# Patient Record
Sex: Male | Born: 1975 | Race: Black or African American | Hispanic: No | Marital: Single | Smoking: Current every day smoker
Health system: Southern US, Community
[De-identification: ages and names within clinical notes are randomized; demographics above are authoritative.]

## PROBLEM LIST (undated history)

## (undated) ENCOUNTER — Emergency Department (HOSPITAL_COMMUNITY): Discharge status: Absent without leave | Payer: Medicare Other

## (undated) ENCOUNTER — Emergency Department (HOSPITAL_COMMUNITY): Discharge status: Absent without leave | Payer: Self-pay

## (undated) DIAGNOSIS — Z720 Tobacco use: Secondary | ICD-10-CM

## (undated) DIAGNOSIS — C801 Malignant (primary) neoplasm, unspecified: Secondary | ICD-10-CM

## (undated) DIAGNOSIS — J40 Bronchitis, not specified as acute or chronic: Secondary | ICD-10-CM

## (undated) HISTORY — DX: Bronchitis, not specified as acute or chronic: J40

---

## 2004-02-14 ENCOUNTER — Emergency Department (HOSPITAL_COMMUNITY): Admission: AD | Admit: 2004-02-14 | Discharge: 2004-02-14 | Payer: Self-pay | Admitting: Emergency Medicine

## 2004-02-14 ENCOUNTER — Emergency Department (HOSPITAL_COMMUNITY): Admission: EM | Admit: 2004-02-14 | Discharge: 2004-02-14 | Payer: Self-pay | Admitting: Emergency Medicine

## 2004-05-19 ENCOUNTER — Encounter: Admission: RE | Admit: 2004-05-19 | Discharge: 2004-06-04 | Payer: Self-pay | Admitting: Orthopaedic Surgery

## 2005-04-01 ENCOUNTER — Emergency Department (HOSPITAL_COMMUNITY): Admission: EM | Admit: 2005-04-01 | Discharge: 2005-04-01 | Payer: Self-pay | Admitting: Emergency Medicine

## 2007-05-28 ENCOUNTER — Emergency Department (HOSPITAL_COMMUNITY): Admission: EM | Admit: 2007-05-28 | Discharge: 2007-05-28 | Payer: Self-pay | Admitting: Emergency Medicine

## 2007-11-11 ENCOUNTER — Emergency Department (HOSPITAL_COMMUNITY): Admission: EM | Admit: 2007-11-11 | Discharge: 2007-11-11 | Payer: Self-pay | Admitting: Emergency Medicine

## 2010-11-19 ENCOUNTER — Emergency Department (HOSPITAL_COMMUNITY)
Admission: EM | Admit: 2010-11-19 | Discharge: 2010-11-19 | Payer: Self-pay | Source: Home / Self Care | Admitting: Emergency Medicine

## 2011-02-06 ENCOUNTER — Emergency Department (HOSPITAL_COMMUNITY)
Admission: EM | Admit: 2011-02-06 | Discharge: 2011-02-07 | Disposition: A | Discharge status: Home / Self Care | Payer: Medicare Other | Attending: Emergency Medicine | Admitting: Emergency Medicine

## 2011-02-06 ENCOUNTER — Emergency Department (HOSPITAL_COMMUNITY): Payer: Medicare Other

## 2011-02-06 DIAGNOSIS — M545 Low back pain, unspecified: Secondary | ICD-10-CM | POA: Insufficient documentation

## 2011-02-06 DIAGNOSIS — X500XXA Overexertion from strenuous movement or load, initial encounter: Secondary | ICD-10-CM | POA: Insufficient documentation

## 2011-02-06 DIAGNOSIS — Y92410 Unspecified street and highway as the place of occurrence of the external cause: Secondary | ICD-10-CM | POA: Insufficient documentation

## 2012-03-20 DIAGNOSIS — Z0000 Encounter for general adult medical examination without abnormal findings: Secondary | ICD-10-CM | POA: Diagnosis not present

## 2012-03-30 DIAGNOSIS — I10 Essential (primary) hypertension: Secondary | ICD-10-CM | POA: Diagnosis not present

## 2012-03-30 DIAGNOSIS — R7309 Other abnormal glucose: Secondary | ICD-10-CM | POA: Diagnosis not present

## 2012-03-30 DIAGNOSIS — M549 Dorsalgia, unspecified: Secondary | ICD-10-CM | POA: Diagnosis not present

## 2012-03-30 DIAGNOSIS — Z0000 Encounter for general adult medical examination without abnormal findings: Secondary | ICD-10-CM | POA: Diagnosis not present

## 2012-03-30 DIAGNOSIS — R634 Abnormal weight loss: Secondary | ICD-10-CM | POA: Diagnosis not present

## 2012-03-30 DIAGNOSIS — E785 Hyperlipidemia, unspecified: Secondary | ICD-10-CM | POA: Diagnosis not present

## 2012-06-29 DIAGNOSIS — R634 Abnormal weight loss: Secondary | ICD-10-CM | POA: Diagnosis not present

## 2012-06-29 DIAGNOSIS — M549 Dorsalgia, unspecified: Secondary | ICD-10-CM | POA: Diagnosis not present

## 2012-10-01 DIAGNOSIS — R634 Abnormal weight loss: Secondary | ICD-10-CM | POA: Diagnosis not present

## 2012-10-01 DIAGNOSIS — M549 Dorsalgia, unspecified: Secondary | ICD-10-CM | POA: Diagnosis not present

## 2012-12-31 DIAGNOSIS — R599 Enlarged lymph nodes, unspecified: Secondary | ICD-10-CM | POA: Diagnosis not present

## 2012-12-31 DIAGNOSIS — R634 Abnormal weight loss: Secondary | ICD-10-CM | POA: Diagnosis not present

## 2013-04-02 DIAGNOSIS — H6120 Impacted cerumen, unspecified ear: Secondary | ICD-10-CM | POA: Diagnosis not present

## 2013-04-15 DIAGNOSIS — H6120 Impacted cerumen, unspecified ear: Secondary | ICD-10-CM | POA: Diagnosis not present

## 2013-07-16 DIAGNOSIS — M549 Dorsalgia, unspecified: Secondary | ICD-10-CM | POA: Diagnosis not present

## 2013-07-30 DIAGNOSIS — M549 Dorsalgia, unspecified: Secondary | ICD-10-CM | POA: Diagnosis not present

## 2013-08-02 ENCOUNTER — Ambulatory Visit (HOSPITAL_COMMUNITY)
Admission: RE | Admit: 2013-08-02 | Discharge: 2013-08-02 | Disposition: A | Discharge status: Home / Self Care | Payer: Medicare Other | Source: Ambulatory Visit | Attending: Internal Medicine | Admitting: Internal Medicine

## 2013-08-02 ENCOUNTER — Other Ambulatory Visit (HOSPITAL_COMMUNITY): Payer: Self-pay | Admitting: Internal Medicine

## 2013-08-02 DIAGNOSIS — R141 Gas pain: Secondary | ICD-10-CM | POA: Insufficient documentation

## 2013-08-02 DIAGNOSIS — M545 Low back pain, unspecified: Secondary | ICD-10-CM | POA: Diagnosis not present

## 2013-08-02 DIAGNOSIS — R142 Eructation: Secondary | ICD-10-CM | POA: Insufficient documentation

## 2013-08-02 DIAGNOSIS — K6389 Other specified diseases of intestine: Secondary | ICD-10-CM | POA: Insufficient documentation

## 2013-08-02 DIAGNOSIS — R140 Abdominal distension (gaseous): Secondary | ICD-10-CM

## 2013-08-02 DIAGNOSIS — M549 Dorsalgia, unspecified: Secondary | ICD-10-CM

## 2013-08-02 DIAGNOSIS — J438 Other emphysema: Secondary | ICD-10-CM | POA: Diagnosis not present

## 2013-08-02 DIAGNOSIS — J40 Bronchitis, not specified as acute or chronic: Secondary | ICD-10-CM | POA: Diagnosis not present

## 2013-09-03 DIAGNOSIS — K5909 Other constipation: Secondary | ICD-10-CM | POA: Diagnosis not present

## 2013-09-03 DIAGNOSIS — M549 Dorsalgia, unspecified: Secondary | ICD-10-CM | POA: Diagnosis not present

## 2013-09-23 DIAGNOSIS — M549 Dorsalgia, unspecified: Secondary | ICD-10-CM | POA: Diagnosis not present

## 2013-11-16 ENCOUNTER — Emergency Department (HOSPITAL_COMMUNITY)
Admission: EM | Admit: 2013-11-16 | Discharge: 2013-11-16 | Disposition: A | Discharge status: Home / Self Care | Payer: Medicare Other | Attending: Emergency Medicine | Admitting: Emergency Medicine

## 2013-11-16 ENCOUNTER — Encounter (HOSPITAL_COMMUNITY): Payer: Self-pay | Admitting: Emergency Medicine

## 2013-11-16 ENCOUNTER — Emergency Department (HOSPITAL_COMMUNITY): Payer: Medicare Other

## 2013-11-16 DIAGNOSIS — Y92410 Unspecified street and highway as the place of occurrence of the external cause: Secondary | ICD-10-CM | POA: Insufficient documentation

## 2013-11-16 DIAGNOSIS — S4980XA Other specified injuries of shoulder and upper arm, unspecified arm, initial encounter: Secondary | ICD-10-CM | POA: Insufficient documentation

## 2013-11-16 DIAGNOSIS — Y9389 Activity, other specified: Secondary | ICD-10-CM | POA: Insufficient documentation

## 2013-11-16 DIAGNOSIS — F17200 Nicotine dependence, unspecified, uncomplicated: Secondary | ICD-10-CM | POA: Insufficient documentation

## 2013-11-16 DIAGNOSIS — M79609 Pain in unspecified limb: Secondary | ICD-10-CM | POA: Diagnosis not present

## 2013-11-16 DIAGNOSIS — M79602 Pain in left arm: Secondary | ICD-10-CM

## 2013-11-16 DIAGNOSIS — S46909A Unspecified injury of unspecified muscle, fascia and tendon at shoulder and upper arm level, unspecified arm, initial encounter: Secondary | ICD-10-CM | POA: Diagnosis not present

## 2013-11-16 MED ORDER — DICLOFENAC SODIUM 75 MG PO TBEC: 75.0000 mg | DELAYED_RELEASE_TABLET | Freq: Two times a day (BID) | ORAL | Status: DC

## 2013-11-16 MED ORDER — METHOCARBAMOL 500 MG PO TABS: 500.0000 mg | ORAL_TABLET | Freq: Three times a day (TID) | ORAL | Status: DC
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
## 2013-11-16 NOTE — ED Provider Notes (Signed)
Medical screening examination/treatment/procedure(s) were performed by non-physician practitioner and as supervising physician I was immediately available for consultation/collaboration.  EKG Interpretation   None        Amaziah Raisanen, MD 11/16/13 2333 
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
## 2014-01-01 DIAGNOSIS — Z0000 Encounter for general adult medical examination without abnormal findings: Secondary | ICD-10-CM | POA: Diagnosis not present

## 2014-01-01 DIAGNOSIS — R634 Abnormal weight loss: Secondary | ICD-10-CM | POA: Diagnosis not present

## 2014-01-01 DIAGNOSIS — R7309 Other abnormal glucose: Secondary | ICD-10-CM | POA: Diagnosis not present

## 2014-01-01 DIAGNOSIS — M549 Dorsalgia, unspecified: Secondary | ICD-10-CM | POA: Diagnosis not present

## 2014-01-01 DIAGNOSIS — E7800 Pure hypercholesterolemia, unspecified: Secondary | ICD-10-CM | POA: Diagnosis not present

## 2014-03-26 ENCOUNTER — Encounter (HOSPITAL_COMMUNITY): Payer: Self-pay | Admitting: Emergency Medicine

## 2014-03-26 ENCOUNTER — Emergency Department (HOSPITAL_COMMUNITY)
Admission: EM | Admit: 2014-03-26 | Discharge: 2014-03-26 | Disposition: A | Discharge status: Home / Self Care | Payer: Medicare Other | Attending: Emergency Medicine | Admitting: Emergency Medicine

## 2014-03-26 DIAGNOSIS — G8911 Acute pain due to trauma: Secondary | ICD-10-CM | POA: Insufficient documentation

## 2014-03-26 DIAGNOSIS — F17200 Nicotine dependence, unspecified, uncomplicated: Secondary | ICD-10-CM | POA: Insufficient documentation

## 2014-03-26 DIAGNOSIS — M79601 Pain in right arm: Secondary | ICD-10-CM

## 2014-03-26 DIAGNOSIS — M79609 Pain in unspecified limb: Secondary | ICD-10-CM | POA: Diagnosis not present

## 2014-03-26 MED ORDER — ONDANSETRON HCL 4 MG PO TABS
4.0000 mg | ORAL_TABLET | Freq: Once | ORAL | Status: AC
  Administered 2014-03-26: 4 mg via ORAL
  Filled 2014-03-26: qty 1

## 2014-03-26 MED ORDER — ACETAMINOPHEN-CODEINE #3 300-30 MG PO TABS
2.0000 | ORAL_TABLET | Freq: Once | ORAL | Status: AC
  Administered 2014-03-26: 2 via ORAL
  Filled 2014-03-26: qty 2

## 2014-03-26 MED ORDER — DEXAMETHASONE SODIUM PHOSPHATE 4 MG/ML IJ SOLN
8.0000 mg | Freq: Once | INTRAMUSCULAR | Status: DC
  Filled 2014-03-26: qty 2

## 2014-03-26 MED ORDER — KETOROLAC TROMETHAMINE 10 MG PO TABS
10.0000 mg | ORAL_TABLET | Freq: Once | ORAL | Status: AC
  Administered 2014-03-26: 10 mg via ORAL
  Filled 2014-03-26: qty 1

## 2014-03-26 NOTE — ED Notes (Signed)
C/o right arm pain started today
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
## 2014-03-26 NOTE — Discharge Instructions (Signed)
I have reviewed your records and your x-ray from previous emergency room visit. Your examination today does not show evidence of any acute changes or problems. No acute neurovascular compromise is appreciated. Please see the orthopedic specialist listed above for evaluation of this problem. Please see Dr. Legrand Rams for assistance with your pain management.

## 2014-03-26 NOTE — ED Provider Notes (Signed)
Medical screening examination/treatment/procedure(s) were performed by non-physician practitioner and as supervising physician I was immediately available for consultation/collaboration.   EKG Interpretation None        Maudry Diego, MD 03/26/14 1521

## 2014-04-03 DIAGNOSIS — M549 Dorsalgia, unspecified: Secondary | ICD-10-CM | POA: Diagnosis not present

## 2014-04-03 DIAGNOSIS — M25529 Pain in unspecified elbow: Secondary | ICD-10-CM | POA: Diagnosis not present

## 2014-04-18 ENCOUNTER — Other Ambulatory Visit (HOSPITAL_COMMUNITY): Payer: Self-pay | Admitting: Orthopaedic Surgery

## 2014-04-18 DIAGNOSIS — M79609 Pain in unspecified limb: Secondary | ICD-10-CM | POA: Diagnosis not present

## 2014-04-18 DIAGNOSIS — F17200 Nicotine dependence, unspecified, uncomplicated: Secondary | ICD-10-CM | POA: Diagnosis not present

## 2014-04-18 DIAGNOSIS — M25519 Pain in unspecified shoulder: Secondary | ICD-10-CM | POA: Diagnosis not present

## 2014-04-18 DIAGNOSIS — M25511 Pain in right shoulder: Secondary | ICD-10-CM

## 2014-04-22 ENCOUNTER — Ambulatory Visit (HOSPITAL_COMMUNITY)
Admission: RE | Admit: 2014-04-22 | Discharge: 2014-04-22 | Disposition: A | Discharge status: Home / Self Care | Payer: Medicare Other | Source: Ambulatory Visit | Attending: Orthopaedic Surgery | Admitting: Orthopaedic Surgery

## 2014-04-22 DIAGNOSIS — M6789 Other specified disorders of synovium and tendon, multiple sites: Secondary | ICD-10-CM | POA: Diagnosis not present

## 2014-04-22 DIAGNOSIS — M7530 Calcific tendinitis of unspecified shoulder: Secondary | ICD-10-CM | POA: Diagnosis not present

## 2014-04-22 DIAGNOSIS — M25619 Stiffness of unspecified shoulder, not elsewhere classified: Secondary | ICD-10-CM | POA: Insufficient documentation

## 2014-04-22 DIAGNOSIS — M25519 Pain in unspecified shoulder: Secondary | ICD-10-CM | POA: Insufficient documentation

## 2014-04-22 DIAGNOSIS — M25511 Pain in right shoulder: Secondary | ICD-10-CM

## 2014-04-29 DIAGNOSIS — F17200 Nicotine dependence, unspecified, uncomplicated: Secondary | ICD-10-CM | POA: Diagnosis not present

## 2014-04-29 DIAGNOSIS — M25519 Pain in unspecified shoulder: Secondary | ICD-10-CM | POA: Diagnosis not present

## 2014-05-05 ENCOUNTER — Ambulatory Visit (HOSPITAL_COMMUNITY)
Admission: RE | Admit: 2014-05-05 | Discharge: 2014-05-05 | Disposition: A | Discharge status: Home / Self Care | Payer: Medicare Other | Source: Ambulatory Visit | Attending: Orthopaedic Surgery | Admitting: Orthopaedic Surgery

## 2014-05-05 DIAGNOSIS — M25519 Pain in unspecified shoulder: Secondary | ICD-10-CM | POA: Insufficient documentation

## 2014-05-05 DIAGNOSIS — M25619 Stiffness of unspecified shoulder, not elsewhere classified: Secondary | ICD-10-CM | POA: Insufficient documentation

## 2014-05-05 DIAGNOSIS — IMO0001 Reserved for inherently not codable concepts without codable children: Secondary | ICD-10-CM | POA: Insufficient documentation

## 2014-05-05 DIAGNOSIS — M25511 Pain in right shoulder: Secondary | ICD-10-CM

## 2014-05-05 DIAGNOSIS — M6281 Muscle weakness (generalized): Secondary | ICD-10-CM | POA: Diagnosis not present
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
## 2014-05-08 ENCOUNTER — Ambulatory Visit (HOSPITAL_COMMUNITY)
Admission: RE | Admit: 2014-05-08 | Discharge: 2014-05-08 | Disposition: A | Discharge status: Home / Self Care | Payer: Medicare Other | Source: Ambulatory Visit | Attending: Orthopaedic Surgery | Admitting: Orthopaedic Surgery

## 2014-05-08 DIAGNOSIS — IMO0001 Reserved for inherently not codable concepts without codable children: Secondary | ICD-10-CM | POA: Diagnosis not present

## 2014-05-08 NOTE — Progress Notes (Signed)
Occupational Therapy Treatment Patient Details  Name: Aaron Spence MRN: [id number] Date of Birth: February 17, 1976  Today's Date: 05/08/2014 Time: 5885-0277 OT Time Calculation (min): 37 min MFR 412-878 12' Therex 905-930  25'  Visit#: 2 of 8  Re-eval: 06/02/14    Authorization: Medicare A  Authorization Time Period: before 10th visit  Authorization Visit#: 2 of 10  Subjective Symptoms/Limitations Symptoms: S: It hurt last night and I had to take a pain pill.  Pain Assessment Currently in Pain?: No/denies  Precautions/Restrictions  Precautions Precautions: None  Exercise/Treatments Supine Protraction: PROM;5 reps;AROM;10 reps Horizontal ABduction: PROM;5 reps;AROM;10 reps External Rotation: PROM;5 reps;AROM;10 reps Internal Rotation: PROM;5 reps;AROM;10 reps Flexion: PROM;5 reps;AROM;10 reps ABduction: PROM;5 reps;AROM;10 reps Standing Protraction: AROM;10 reps Horizontal ABduction: AROM;10 reps External Rotation: AROM;10 reps Internal Rotation: AROM;10 reps Flexion: AROM;10 reps ABduction: AROM;10 reps ROM / Strengthening / Isometric Strengthening Wall Wash: 1' Proximal Shoulder Strengthening, Supine: 10X Proximal Shoulder Strengthening, Seated: 10X      Manual Therapy Manual Therapy: Myofascial release Myofascial Release: Myofascial release (MFR) and manual stretching to right upper arm, trapezius, and scapularis region to decrease fascial restrictions and increase joint mobility in a pain free zone.   Occupational Therapy Assessment and Plan OT Assessment and Plan Clinical Impression Statement: A: Pt completed AROM supine and standing with vc's needed for proper form and technique. Patient tolerated well overall.  OT Plan: P: Add ball on the wall and UBE bike. Increase reps for AROM   Goals Long Term Goals Time to Complete Long Term Goals: 4 weeks Long Term Goal 1: Patient will be educated on HEP. Long Term Goal 1 Progress: Progressing toward goal Long  Term Goal 2: Patient will return to highest level of independence with all BADL and leisure tasks.  Long Term Goal 2 Progress: Progressing toward goal Long Term Goal 3: Patient will report a pain level of 4/10 or less with daily tasks.  Long Term Goal 3 Progress: Progressing toward goal Long Term Goal 4: Patient will increase RUE shoulder AROM to WNL to increase ability to reach into overhead cabinets. Long Term Goal 4 Progress: Progressing toward goal Long Term Goal 5: Patient will increase RUE shoulder strength to 5/5 to increase ability to lift heavy items out of overhead cabinets. Long Term Goal 5 Progress: Progressing toward goal Additional Long Term Goals?: Yes Long Term Goal 6: Patient will decrease fascial restrictions from Max to Min amount in RUE.  Long Term Goal 6 Progress: Progressing toward goal  Problem List Patient Active Problem List   Diagnosis Date Noted  . Pain in joint, shoulder region 05/05/2014  . Muscle weakness (generalized) 05/05/2014    End of Session Activity Tolerance: Patient tolerated treatment well General Behavior During Therapy: [facility] for tasks assessed/performed   Ailene Ravel, OTR/L,CBIS   05/08/2014, 9:31 AM

## 2014-05-13 ENCOUNTER — Ambulatory Visit (HOSPITAL_COMMUNITY)
Admission: RE | Admit: 2014-05-13 | Discharge: 2014-05-13 | Disposition: A | Discharge status: Home / Self Care | Payer: Medicare Other | Source: Ambulatory Visit

## 2014-05-13 DIAGNOSIS — IMO0001 Reserved for inherently not codable concepts without codable children: Secondary | ICD-10-CM | POA: Diagnosis not present

## 2014-05-13 NOTE — Progress Notes (Signed)
Occupational Therapy Treatment Patient Details  Name: Aaron Spence MRN: [id number] Date of Birth: October 01, 1976  Today's Date: 05/13/2014 Time: 1540-0867 OT Time Calculation (min): 42 min Manual 6195-0932 (15') Therapeutic Exercises 1406-1433 (102')  Visit#: 3 of 8  Re-eval: 06/02/14    Authorization: Medicare A  Authorization Time Period: before 10th visit  Authorization Visit#: 3 of 10  Subjective Symptoms/Limitations Symptoms: "its a little bit better. its getting there, but not all the way." Pain Assessment Currently in Pain?: Yes Pain Score: 7  Pain Location: Shoulder Pain Orientation: Right Pain Type: Acute pain  Precautions/Restrictions     Exercise/Treatments Supine Protraction: PROM;10 reps;AROM;12 reps Horizontal ABduction: PROM;10 reps;AROM;12 reps External Rotation: PROM;10 reps;AROM;12 reps Internal Rotation: PROM;10 reps;AROM;12 reps Flexion: PROM;10 reps;AROM;12 reps ABduction: PROM;10 reps;AROM;12 reps Standing Protraction: AROM;12 reps Horizontal ABduction: AROM;12 reps External Rotation: AROM;12 reps Internal Rotation: AROM;12 reps Flexion: AROM;12 reps ABduction: AROM;12 reps ROM / Strengthening / Isometric Strengthening UBE (Upper Arm Bike): 2 min forward and back at 3.0 Proximal Shoulder Strengthening, Supine: 12x Proximal Shoulder Strengthening, Seated: 12x Ball on Wall: 1 min each in flexion and abduction with weighted green ball   Manual Therapy Manual Therapy: Myofascial release Myofascial Release: Myofascial release (MFR) and manual stretching to right upper arm, trapezius, and scapularis region to decrease fascial restrictions and increase joint mobility in a pain free zone.     Occupational Therapy Assessment and Plan OT Assessment and Plan Clinical Impression Statement: Increased reps with supine and standing AROM this session. Pt denied dincreased pain with motion, but had increased facial grimicing with supine. Pt only completed  exercises using affected UE in supine, but used BUE in standing. Added ball on wall in flexion and abduction - pt did not verbalize any difficulty, but did demonstrate increased diffiuclty with maintain ball position on wall in abduction.  Added UBE, and pt tolerated well.  Pt required significant verbal cueing overall for form and technique.  OT Plan: Increase AROM reps - facilitate improved form in AROM with fewer cues.   Goals Long Term Goals Long Term Goal 1: Patient will be educated on HEP. Long Term Goal 1 Progress: Progressing toward goal Long Term Goal 2: Patient will return to highest level of independence with all BADL and leisure tasks.  Long Term Goal 2 Progress: Progressing toward goal Long Term Goal 3: Patient will report a pain level of 4/10 or less with daily tasks.  Long Term Goal 3 Progress: Progressing toward goal Long Term Goal 4: Patient will increase RUE shoulder AROM to WNL to increase ability to reach into overhead cabinets. Long Term Goal 4 Progress: Progressing toward goal Long Term Goal 5: Patient will increase RUE shoulder strength to 5/5 to increase ability to lift heavy items out of overhead cabinets. Long Term Goal 5 Progress: Progressing toward goal Long Term Goal 6: Patient will decrease fascial restrictions from Max to Min amount in RUE.  Long Term Goal 6 Progress: Progressing toward goal  Problem List Patient Active Problem List   Diagnosis Date Noted  . Pain in joint, shoulder region 05/05/2014  . Muscle weakness (generalized) 05/05/2014    End of Session Activity Tolerance: Patient tolerated treatment well General Behavior During Therapy: [facility] for tasks assessed/performed  GO   Bea Graff, MS, OTR/L [phone]  05/13/2014, 2:33 PM

## 2014-05-15 ENCOUNTER — Ambulatory Visit (HOSPITAL_COMMUNITY)
Admission: RE | Admit: 2014-05-15 | Discharge: 2014-05-15 | Disposition: A | Discharge status: Home / Self Care | Payer: Medicare Other | Source: Ambulatory Visit | Attending: Internal Medicine | Admitting: Internal Medicine

## 2014-05-15 DIAGNOSIS — IMO0001 Reserved for inherently not codable concepts without codable children: Secondary | ICD-10-CM | POA: Diagnosis not present

## 2014-05-15 NOTE — Progress Notes (Signed)
Occupational Therapy Treatment Patient Details  Name: Aaron Spence MRN: [id number] Date of Birth: Apr 16, 1976  Today's Date: 05/15/2014 Time: 8921-1941 OT Time Calculation (min): 43 min MFR 1350-1404 14' Therex 7408-1448 29'  Visit#: 4 of 8  Re-eval: 06/02/14    Authorization: Medicare A  Authorization Time Period: before 10th visit  Authorization Visit#: 4 of 10  Subjective Symptoms/Limitations Symptoms: S: It doesn't start huring until it's agravated. Pain Assessment Currently in Pain?: No/denies  Precautions/Restrictions  Precautions Precautions: None  Exercise/Treatments Supine Protraction: PROM;10 reps;AROM;12 reps Horizontal ABduction: PROM;10 reps;AROM;12 reps External Rotation: PROM;10 reps;AROM;12 reps Internal Rotation: PROM;10 reps;AROM;12 reps Flexion: PROM;10 reps;AROM;12 reps ABduction: PROM;10 reps;AROM;12 reps Standing Protraction: AROM;12 reps Horizontal ABduction: AROM;12 reps External Rotation: AROM;12 reps Internal Rotation: AROM;12 reps Flexion: AROM;12 reps ABduction: AROM;12 reps ROM / Strengthening / Isometric Strengthening UBE (Upper Arm Bike): Level 1 3' forward 3' reverse Proximal Shoulder Strengthening, Supine: 12x Proximal Shoulder Strengthening, Seated: 12x Ball on Wall: 1 min each in flexion and abduction with weighted green ball      Manual Therapy Manual Therapy: Myofascial release Myofascial Release: Myofascial release (MFR) and manual stretching to right upper arm, trapezius, and scapularis region to decrease fascial restrictions and increase joint mobility in a pain free zone.  Occupational Therapy Assessment and Plan OT Assessment and Plan Clinical Impression Statement: [name] completed all exercises with only initial cueing for proper form at start of movement. No difficulty keeping ball in position during ball on the wall.  OT Plan: P: Add X to V arms   Goals Long Term Goals Time to Complete Long Term Goals: 4  weeks Long Term Goal 1: Patient will be educated on HEP. Long Term Goal 1 Progress: Progressing toward goal Long Term Goal 2: Patient will return to highest level of independence with all BADL and leisure tasks.  Long Term Goal 2 Progress: Progressing toward goal Long Term Goal 3: Patient will report a pain level of 4/10 or less with daily tasks.  Long Term Goal 3 Progress: Progressing toward goal Long Term Goal 4: Patient will increase RUE shoulder AROM to WNL to increase ability to reach into overhead cabinets. Long Term Goal 4 Progress: Progressing toward goal Long Term Goal 5: Patient will increase RUE shoulder strength to 5/5 to increase ability to lift heavy items out of overhead cabinets. Long Term Goal 5 Progress: Progressing toward goal Long Term Goal 6: Patient will decrease fascial restrictions from Max to Min amount in RUE.  Long Term Goal 6 Progress: Progressing toward goal  Problem List Patient Active Problem List   Diagnosis Date Noted  . Pain in joint, shoulder region 05/05/2014  . Muscle weakness (generalized) 05/05/2014    End of Session Activity Tolerance: Patient tolerated treatment well General Behavior During Therapy: [facility] for tasks assessed/performed   Ailene Ravel, OTR/L,CBIS   05/15/2014, 2:28 PM

## 2014-05-20 ENCOUNTER — Ambulatory Visit (HOSPITAL_COMMUNITY)
Admission: RE | Admit: 2014-05-20 | Discharge: 2014-05-20 | Disposition: A | Discharge status: Home / Self Care | Payer: Medicare Other | Source: Ambulatory Visit | Attending: Orthopaedic Surgery | Admitting: Orthopaedic Surgery

## 2014-05-20 DIAGNOSIS — IMO0001 Reserved for inherently not codable concepts without codable children: Secondary | ICD-10-CM | POA: Diagnosis not present

## 2014-05-20 NOTE — Progress Notes (Signed)
Occupational Therapy Treatment Patient Details  Name: Aaron Spence MRN: [id number] Date of Birth: 01/22/1976  Today's Date: 05/20/2014 Time: 1607-3710 OT Time Calculation (min): 48 min MFR 1347-1400 13' Therex 6269-4854  35'  Visit#: 5 of 8  Re-eval: 06/02/14    Authorization: Medicare A  Authorization Time Period: before 10th visit  Authorization Visit#: 5 of 10  Subjective Symptoms/Limitations Symptoms: S: My shoulder is just a little sore.  Pain Assessment Currently in Pain?: Yes Pain Score: 1  Pain Location: Shoulder Pain Orientation: Right Pain Type: Acute pain  Precautions/Restrictions  Precautions Precautions: None  Exercise/Treatments Supine Protraction: PROM;5 reps;Strengthening;Weights;12 reps Protraction Weight (lbs): 1 Horizontal ABduction: PROM;5 reps;Strengthening;Weights;12 reps Horizontal ABduction Weight (lbs): 1 External Rotation: PROM;5 reps;Strengthening;Weights;12 reps External Rotation Weight (lbs): 1 Internal Rotation: PROM;5 reps;Strengthening;Weights;12 reps Internal Rotation Weight (lbs): 1 Flexion: PROM;5 reps;Strengthening;Weights;12 reps Shoulder Flexion Weight (lbs): 1 ABduction: PROM;5 reps;Strengthening;Weights;12 reps Shoulder ABduction Weight (lbs): 1 Standing Protraction: AROM;12 reps Horizontal ABduction: AROM;12 reps External Rotation: AROM;12 reps Internal Rotation: AROM;12 reps Flexion: AROM;12 reps ABduction: AROM;12 reps ROM / Strengthening / Isometric Strengthening UBE (Upper Arm Bike): Level 1 3' forward 3' reverse X to V Arms: 12X Proximal Shoulder Strengthening, Supine: 10X with 1# Proximal Shoulder Strengthening, Seated: 12x Ball on Wall: 1 min each in flexion and abduction with weighted green ball      Manual Therapy Manual Therapy: Myofascial release Myofascial Release: Myofascial release (MFR) and manual stretching to right upper arm, trapezius, and scapularis region to decrease fascial restrictions and  increase joint mobility in a pain free zone  Occupational Therapy Assessment and Plan OT Assessment and Plan Clinical Impression Statement: [name] required min-mod assist for proper form and technique of exercises. Patient needed cueing to try and keep arms as straight as possible as he would consistantly bend his elbows. Patient does not count reps most of the time . Therapist requested patient to count out loud to remain on task. Added X to V arms and 1# handweight  supine. Patient tolerated well.  OT Plan: P: Cont to work on performing all exercises with proper form. Add Cybex row/press.   Goals Long Term Goals Time to Complete Long Term Goals: 4 weeks Long Term Goal 1: Patient will be educated on HEP. Long Term Goal 1 Progress: Progressing toward goal Long Term Goal 2: Patient will return to highest level of independence with all BADL and leisure tasks.  Long Term Goal 2 Progress: Progressing toward goal Long Term Goal 3: Patient will report a pain level of 4/10 or less with daily tasks.  Long Term Goal 3 Progress: Progressing toward goal Long Term Goal 4: Patient will increase RUE shoulder AROM to WNL to increase ability to reach into overhead cabinets. Long Term Goal 4 Progress: Progressing toward goal Long Term Goal 5: Patient will increase RUE shoulder strength to 5/5 to increase ability to lift heavy items out of overhead cabinets. Long Term Goal 5 Progress: Progressing toward goal Long Term Goal 6: Patient will decrease fascial restrictions from Max to Min amount in RUE.  Long Term Goal 6 Progress: Progressing toward goal  Problem List Patient Active Problem List   Diagnosis Date Noted  . Pain in joint, shoulder region 05/05/2014  . Muscle weakness (generalized) 05/05/2014    End of Session Activity Tolerance: Patient tolerated treatment well General Behavior During Therapy: [facility] for tasks assessed/performed   Ailene Ravel, OTR/L,CBIS   05/20/2014, 2:42 PM

## 2014-05-22 ENCOUNTER — Ambulatory Visit (HOSPITAL_COMMUNITY)
Admission: RE | Admit: 2014-05-22 | Discharge: 2014-05-22 | Disposition: A | Discharge status: Home / Self Care | Payer: Medicare Other | Source: Ambulatory Visit | Attending: Orthopaedic Surgery | Admitting: Orthopaedic Surgery

## 2014-05-22 DIAGNOSIS — IMO0001 Reserved for inherently not codable concepts without codable children: Secondary | ICD-10-CM | POA: Diagnosis not present
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
## 2014-05-27 ENCOUNTER — Inpatient Hospital Stay (HOSPITAL_COMMUNITY): Admission: RE | Admit: 2014-05-27 | Payer: Medicare Other | Source: Ambulatory Visit

## 2014-05-29 ENCOUNTER — Telehealth (HOSPITAL_COMMUNITY): Payer: Self-pay

## 2014-05-29 ENCOUNTER — Ambulatory Visit (HOSPITAL_COMMUNITY): Admission: RE | Admit: 2014-05-29 | Payer: Medicare Other | Source: Ambulatory Visit

## 2014-05-29 NOTE — Telephone Encounter (Signed)
05/29/14 1335  Called pt regarding missed appointment on 7/28 and current no-show status for 7/30 1:00 appointment.  [place] pt answered the phone, he responeded that he had arrived at therapy, but was not checked in yet. OTR went to the front office to talk with pt. Informed pt that he was more than 30 minutes late and that today's appointment would have to be cancelled due to therapist having another appointment scheduled.  Pt was understanding.  Pt stated that his previous missed appointment was due to transportation concerns.  Informed pt of next appointment time.  Bea Graff, [place], OTR/L [phone]

## 2014-06-02 ENCOUNTER — Ambulatory Visit (HOSPITAL_COMMUNITY)
Admission: RE | Admit: 2014-06-02 | Discharge: 2014-06-02 | Disposition: A | Discharge status: Home / Self Care | Payer: Medicare Other | Source: Ambulatory Visit | Attending: Orthopaedic Surgery | Admitting: Orthopaedic Surgery

## 2014-06-02 DIAGNOSIS — M25519 Pain in unspecified shoulder: Secondary | ICD-10-CM | POA: Insufficient documentation

## 2014-06-02 DIAGNOSIS — M25619 Stiffness of unspecified shoulder, not elsewhere classified: Secondary | ICD-10-CM | POA: Diagnosis not present

## 2014-06-02 DIAGNOSIS — IMO0001 Reserved for inherently not codable concepts without codable children: Secondary | ICD-10-CM | POA: Diagnosis not present

## 2014-06-02 DIAGNOSIS — M6281 Muscle weakness (generalized): Secondary | ICD-10-CM | POA: Insufficient documentation

## 2014-06-02 NOTE — Evaluation (Signed)
Occupational Therapy Reassessment and Discharge  Patient Details  Name: Aaron Spence MRN: [id number] Date of Birth: October 16, 1976  Today's Date: 06/02/2014 Time: 2683-4196 OT Time Calculation (min): 40 min MFR 2229-7989 13' Reassess 1318-1345 27'   Visit#: 7 of 8  Re-eval: 06/02/14  Assessment Diagnosis: Right Shoulder Pain  Authorization: Medicare A  Authorization Time Period: before 10th visit  Authorization Visit#: 7 of 10   Past Medical History: No past medical history on file. Past Surgical History: No past surgical history on file.  Subjective Symptoms/Limitations Symptoms: S: I did a lot of yardwork yesterday and my arm is really hurting me today.  Pain Assessment Currently in Pain?: Yes Pain Score: 8  Pain Location: Shoulder Pain Orientation: Right Pain Type: Acute pain  Precautions/Restrictions  Precautions Precautions: None   Assessment Additional Assessments RUE Assessment RUE Assessment:  (assessed seated. IR/ER adducted) RUE AROM (degrees) Right Shoulder Flexion: 140 Degrees (same on eval) Right Shoulder ABduction: 125 Degrees (on eval; 108) Right Shoulder Internal Rotation: 90 Degrees (same on eval) Right Shoulder External Rotation: 75 Degrees (on eval: same) RUE Strength Right Shoulder Flexion: 5/5 (on eval: 4-/5) Right Shoulder ABduction: 5/5 (on eval: 4-/5) Right Shoulder Internal Rotation: 5/5 (on eval: 4-/5) Right Shoulder External Rotation: 5/5 (on eval: 4-/5) Palpation Palpation: min fascial restrictions in right upper arm, trapezius, and scapularis region.      Exercise/Treatments    Manual Therapy Manual Therapy: Myofascial release Myofascial Release: Myofascial release (MFR) and manual stretching to right upper arm, trapezius, and scapularis region to decrease fascial restrictions and increase joint mobility in a pain free zone  Occupational Therapy Assessment and Plan OT Assessment and Plan Clinical Impression Statement: A:  Reassessment and discharge completed this date. patient has met 5/6 therapy goals. Pt continues to have pain during daily tasks and has increase joint mobiilty and strength measurements. Patient was given green theraband to use at home and  instructed to continue performing HEP at home independently.  OT Plan: P: D/C from therapy.    Goals Long Term Goals Time to Complete Long Term Goals: 4 weeks Long Term Goal 1: Patient will be educated on HEP. Long Term Goal 1 Progress: Met Long Term Goal 2: Patient will return to highest level of independence with all BADL and leisure tasks.  Long Term Goal 2 Progress: Met Long Term Goal 3: Patient will report a pain level of 4/10 or less with daily tasks.  Long Term Goal 3 Progress: Not met Long Term Goal 4: Patient will increase RUE shoulder AROM to WNL to increase ability to reach into overhead cabinets. Long Term Goal 4 Progress: Met Long Term Goal 5: Patient will increase RUE shoulder strength to 5/5 to increase ability to lift heavy items out of overhead cabinets. Long Term Goal 5 Progress: Met Long Term Goal 6: Patient will decrease fascial restrictions from Max to [place] amount in RUE.  Long Term Goal 6 Progress: Met  Problem List Patient Active Problem List   Diagnosis Date Noted  . Pain in joint, shoulder region 05/05/2014  . Muscle weakness (generalized) 05/05/2014    End of Session Activity Tolerance: Patient tolerated treatment well General Behavior During Therapy: W[facility] for tasks assessed/performed  GO Functional Assessment Tool Used: FOTO score: 75/100 (25% impaired) Functional Limitation: Carrying, moving and handling objects Carrying, Moving and Handling Objects Current Status (Q1194): At least 20 percent but less than 40 percent impaired, limited or restricted Carrying, Moving and Handling Objects Goal Status ([id number]): 0 percent impaired,  limited or restricted Carrying, Moving and Handling Objects Discharge Status [phone]): At least  20 percent but less than 40 percent impaired, limited or restricted  Ailene Ravel, OTR/L,CBIS   06/02/2014, 1:43 PM  Physician Documentation Your signature is required to indicate approval of the treatment plan as stated above.  Please sign and either send electronically or make a copy of this report for your files and return this physician signed original.  Please mark one 1.__approve of plan  2. ___approve of plan with the following conditions.   ______________________________                                                          _____________________ Physician Signature                                                                                                             Date

## 2014-06-04 ENCOUNTER — Ambulatory Visit (HOSPITAL_COMMUNITY): Payer: Medicare Other

## 2014-06-10 ENCOUNTER — Ambulatory Visit (HOSPITAL_COMMUNITY): Payer: Medicare Other

## 2014-06-12 ENCOUNTER — Ambulatory Visit (HOSPITAL_COMMUNITY): Payer: Medicare Other

## 2014-06-26 DIAGNOSIS — F17200 Nicotine dependence, unspecified, uncomplicated: Secondary | ICD-10-CM | POA: Diagnosis not present

## 2014-06-26 DIAGNOSIS — M25519 Pain in unspecified shoulder: Secondary | ICD-10-CM | POA: Diagnosis not present

## 2014-09-18 DIAGNOSIS — H6120 Impacted cerumen, unspecified ear: Secondary | ICD-10-CM | POA: Diagnosis not present

## 2014-10-16 ENCOUNTER — Emergency Department (HOSPITAL_COMMUNITY)
Admission: EM | Admit: 2014-10-16 | Discharge: 2014-10-16 | Disposition: A | Discharge status: Home / Self Care | Payer: Medicare Other | Attending: Emergency Medicine | Admitting: Emergency Medicine

## 2014-10-16 ENCOUNTER — Encounter (HOSPITAL_COMMUNITY): Payer: Self-pay | Admitting: Emergency Medicine

## 2014-10-16 ENCOUNTER — Emergency Department (HOSPITAL_COMMUNITY): Payer: Medicare Other

## 2014-10-16 DIAGNOSIS — Y9389 Activity, other specified: Secondary | ICD-10-CM | POA: Diagnosis not present

## 2014-10-16 DIAGNOSIS — M25561 Pain in right knee: Secondary | ICD-10-CM | POA: Diagnosis not present

## 2014-10-16 DIAGNOSIS — T1490XA Injury, unspecified, initial encounter: Secondary | ICD-10-CM

## 2014-10-16 DIAGNOSIS — S80911A Unspecified superficial injury of right knee, initial encounter: Secondary | ICD-10-CM | POA: Diagnosis not present

## 2014-10-16 DIAGNOSIS — S79921A Unspecified injury of right thigh, initial encounter: Secondary | ICD-10-CM | POA: Diagnosis not present

## 2014-10-16 DIAGNOSIS — S7011XA Contusion of right thigh, initial encounter: Secondary | ICD-10-CM | POA: Diagnosis not present

## 2014-10-16 DIAGNOSIS — Y998 Other external cause status: Secondary | ICD-10-CM | POA: Insufficient documentation

## 2014-10-16 DIAGNOSIS — S8991XA Unspecified injury of right lower leg, initial encounter: Secondary | ICD-10-CM

## 2014-10-16 DIAGNOSIS — Z720 Tobacco use: Secondary | ICD-10-CM | POA: Diagnosis not present

## 2014-10-16 DIAGNOSIS — Y92410 Unspecified street and highway as the place of occurrence of the external cause: Secondary | ICD-10-CM | POA: Diagnosis not present

## 2014-10-16 DIAGNOSIS — M79604 Pain in right leg: Secondary | ICD-10-CM | POA: Diagnosis not present

## 2014-10-16 MED ORDER — NAPROXEN 500 MG PO TABS: 500.0000 mg | ORAL_TABLET | Freq: Two times a day (BID) | ORAL | Status: DC
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
## 2014-10-16 NOTE — Discharge Instructions (Signed)
Use a knee immobilizer at all times can take it off to shower. Crutches as needed. Make an appointment to follow-up with orthopedics Dr. Luna Glasgow. Take the Naprosyn as directed. Return for any new or worse symptoms.

## 2015-03-10 DIAGNOSIS — Z681 Body mass index (BMI) 19 or less, adult: Secondary | ICD-10-CM | POA: Diagnosis not present

## 2015-03-10 DIAGNOSIS — M25411 Effusion, right shoulder: Secondary | ICD-10-CM | POA: Diagnosis not present

## 2015-03-10 DIAGNOSIS — F17201 Nicotine dependence, unspecified, in remission: Secondary | ICD-10-CM | POA: Diagnosis not present

## 2015-04-08 DIAGNOSIS — M545 Low back pain: Secondary | ICD-10-CM | POA: Diagnosis not present

## 2015-04-08 DIAGNOSIS — M25411 Effusion, right shoulder: Secondary | ICD-10-CM | POA: Diagnosis not present

## 2015-04-08 DIAGNOSIS — F17201 Nicotine dependence, unspecified, in remission: Secondary | ICD-10-CM | POA: Diagnosis not present

## 2015-04-08 DIAGNOSIS — Z681 Body mass index (BMI) 19 or less, adult: Secondary | ICD-10-CM | POA: Diagnosis not present

## 2015-05-06 DIAGNOSIS — Z681 Body mass index (BMI) 19 or less, adult: Secondary | ICD-10-CM | POA: Diagnosis not present

## 2015-05-06 DIAGNOSIS — M25411 Effusion, right shoulder: Secondary | ICD-10-CM | POA: Diagnosis not present

## 2015-05-06 DIAGNOSIS — F17201 Nicotine dependence, unspecified, in remission: Secondary | ICD-10-CM | POA: Diagnosis not present

## 2015-05-06 DIAGNOSIS — M545 Low back pain: Secondary | ICD-10-CM | POA: Diagnosis not present

## 2015-06-03 DIAGNOSIS — Z681 Body mass index (BMI) 19 or less, adult: Secondary | ICD-10-CM | POA: Diagnosis not present

## 2015-06-03 DIAGNOSIS — F17201 Nicotine dependence, unspecified, in remission: Secondary | ICD-10-CM | POA: Diagnosis not present

## 2015-06-03 DIAGNOSIS — M5441 Lumbago with sciatica, right side: Secondary | ICD-10-CM | POA: Diagnosis not present

## 2015-06-03 DIAGNOSIS — M25511 Pain in right shoulder: Secondary | ICD-10-CM | POA: Diagnosis not present

## 2015-07-03 DIAGNOSIS — Z681 Body mass index (BMI) 19 or less, adult: Secondary | ICD-10-CM | POA: Diagnosis not present

## 2015-07-03 DIAGNOSIS — F17201 Nicotine dependence, unspecified, in remission: Secondary | ICD-10-CM | POA: Diagnosis not present

## 2015-07-03 DIAGNOSIS — M25511 Pain in right shoulder: Secondary | ICD-10-CM | POA: Diagnosis not present

## 2015-07-15 ENCOUNTER — Ambulatory Visit (HOSPITAL_COMMUNITY): Payer: Medicare Other | Attending: Orthopaedic Surgery

## 2015-07-15 ENCOUNTER — Encounter (HOSPITAL_COMMUNITY): Payer: Self-pay

## 2015-07-15 DIAGNOSIS — M25511 Pain in right shoulder: Secondary | ICD-10-CM | POA: Diagnosis not present

## 2015-07-15 DIAGNOSIS — R531 Weakness: Secondary | ICD-10-CM | POA: Diagnosis not present

## 2015-07-15 DIAGNOSIS — M629 Disorder of muscle, unspecified: Secondary | ICD-10-CM | POA: Insufficient documentation

## 2015-07-15 DIAGNOSIS — M25611 Stiffness of right shoulder, not elsewhere classified: Secondary | ICD-10-CM | POA: Insufficient documentation

## 2015-07-15 DIAGNOSIS — M6289 Other specified disorders of muscle: Secondary | ICD-10-CM
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
## 2015-07-15 NOTE — Patient Instructions (Signed)
  Flexibility: Corner Stretch   Standing in corner with hands just above shoulder level and 2 feet from corner, lean forward until a comfortable stretch is felt across chest. Hold _10___ seconds. Repeat __3__ times per set.    Scapular Retraction (Standing)   With arms at sides, pinch shoulder blades together. Repeat __10__ times per set.   http://orth.exer.us/944   Copyright  VHI. All rights reserved.   ROM: Towel Stretch - with Interior Rotation   Pull left arm up behind back by pulling towel up with other arm. Hold _10___ seconds. Repeat __3__ times per set.    Posterior Capsule Stretch   Stand or sit, one arm across body so hand rests over opposite shoulder. Gently push on crossed elbow with other hand until stretch is felt in shoulder of crossed arm. Hold 10___ seconds.  Repeat _3__ times per session. Do ___ sessions per day.  Copyright  VHI. All rights reserved.   Flexors Stretch, Standing   Stand near wall and slide arm up, with palm facing away from wall, by leaning toward wall. Hold _10__ seconds.  Repeat __3_ times per session. Do ___ sessions per day.  Copyright  VHI. All rights reserved.    (Home) Extension: Isometric / Bilateral Arm Retraction - Sitting   Facing anchor, hold hands and elbow at shoulder height, with elbow bent.  Pull arms back to squeeze shoulder blades together. Repeat 10-15 times.  Copyright  VHI. All rights reserved.   (Home) Retraction: Row - Bilateral (Anchor)   Facing anchor, arms reaching forward, pull hands toward stomach, keeping elbows bent and at your sides and pinching shoulder blades together. Repeat 10-15 times.  Copyright  VHI. All rights reserved.   (Clinic) Extension / Flexion (Assist)   Face anchor, pull arms back, keeping elbow straight, and squeze shoulder blades together. Repeat 10-15 times.   Copyright  VHI. All rights reserved.

## 2015-07-20 ENCOUNTER — Ambulatory Visit (HOSPITAL_COMMUNITY): Payer: Medicare Other | Admitting: Specialist

## 2015-07-22 ENCOUNTER — Ambulatory Visit (HOSPITAL_COMMUNITY): Payer: Medicare Other | Admitting: Occupational Therapy

## 2015-07-23 ENCOUNTER — Encounter (HOSPITAL_COMMUNITY): Payer: Self-pay | Admitting: Occupational Therapy

## 2015-07-23 ENCOUNTER — Ambulatory Visit (HOSPITAL_COMMUNITY): Payer: Medicare Other | Admitting: Occupational Therapy

## 2015-07-23 DIAGNOSIS — M25511 Pain in right shoulder: Secondary | ICD-10-CM | POA: Diagnosis not present

## 2015-07-23 DIAGNOSIS — M6289 Other specified disorders of muscle: Secondary | ICD-10-CM

## 2015-07-23 DIAGNOSIS — R531 Weakness: Secondary | ICD-10-CM

## 2015-07-23 DIAGNOSIS — M629 Disorder of muscle, unspecified: Secondary | ICD-10-CM | POA: Diagnosis not present

## 2015-07-23 DIAGNOSIS — M25611 Stiffness of right shoulder, not elsewhere classified: Secondary | ICD-10-CM

## 2015-07-23 NOTE — Therapy (Signed)
[place], [place], [address] Phone: [phone]   Fax:  [phone]  Occupational Therapy Treatment  Patient Details  Name: Aaron Spence MRN: [id number] Date of Birth: 09/25/1976 Referring Provider:  Rosita Fire, MD  Encounter Date: 07/23/2015      OT End of Session - 07/23/15 1145    Visit Number 2   Number of Visits 8   Date for OT Re-Evaluation 09/13/15  Mini reassess: 08/12/15   Authorization Type Medicare primary Medicaid secondary 07/15/15: Reviewed insurance coverage with patient.   Authorization Time Period before 10th visit   Authorization - Visit Number 2   Authorization - Number of Visits 10   OT Start Time 1017   OT Stop Time 1100   OT Time Calculation (min) 43 min   Activity Tolerance Patient tolerated treatment well   Behavior During Therapy WFL for tasks assessed/performed      History reviewed. No pertinent past medical history.  No past surgical history on file.  There were no vitals filed for this visit.  Visit Diagnosis:  Pain in joint, shoulder region, right  Weakness generalized  Shoulder stiffness, right  Tight fascia      Subjective Assessment - 07/23/15 1023    Subjective  S: This weather isn't helping it.    Currently in Pain? Yes   Pain Score 8    Pain Location Shoulder   Pain Orientation Right   Pain Descriptors / Indicators Aching            [facility] OT Assessment - 07/23/15 1041    Assessment   Diagnosis Right shoulder pain   Precautions   Precautions None                  OT Treatments/Exercises (OP) - 07/23/15 1024    Exercises   Exercises Shoulder   Shoulder Exercises: Supine   Protraction PROM;10 reps;AROM;12 reps   Horizontal ABduction PROM;10 reps;AROM;12 reps   External Rotation PROM;10 reps;AROM;12 reps   Internal Rotation PROM;10 reps;AROM;12 reps   Flexion PROM;10 reps;AROM;12 reps   ABduction PROM;10 reps;AROM;12 reps   Shoulder Exercises: Standing   Protraction AROM;12 reps   Horizontal ABduction AROM;12 reps   External Rotation AROM;12 reps   Internal Rotation AROM;12 reps   Flexion AROM;12 reps   ABduction AROM;12 reps   Shoulder Exercises: Pulleys   Flexion 1 minute   ABduction 1 minute   Shoulder Exercises: ROM/Strengthening   Proximal Shoulder Strengthening, Supine 10X each, no rest break   Prot/Ret//Elev/Dep 1'   Manual Therapy   Manual Therapy Myofascial release   Myofascial Release Myofascial release and manual stretching to right upper arm and anterior deltoid region to decrease fascial restrictions and pain, and increase joint range of motion.                   OT Short Term Goals - 07/23/15 1148    OT SHORT TERM GOAL #1   Title Patient will be independent and educated on HEP.   Time 4   Period Weeks   Status On-going   OT SHORT TERM GOAL #2   Title Patient will decrease pain to a 5/10 or less during daily tasks.    Time 4   Period Weeks   Status On-going   OT SHORT TERM GOAL #3   Title Patient will increase shoulder flexion and abduction A/ROM by 20 degrees to increase ability to complete overhead activities.  Time 4   Period Weeks   Status On-going   OT SHORT TERM GOAL #4   Title Patient will decrease fascial restrictions to trace amount.    Time 4   Period Weeks   Status On-going   OT SHORT TERM GOAL #5   Title Patient will increase posture with the use of scapular strengthening exercises and education.   Time 4   Period Weeks   Status On-going                  Plan - 07/23/15 1146    Clinical Impression Statement A: Initiated myofascial release, PROM, and AROM exercises this session. Pt requires constant cuing for form during exercises. Pt tolerated treatment well, no increased pain at end of session.    Plan P: Add scapular theraband exercises.         Problem List Patient Active Problem List   Diagnosis Date Noted  . Pain in joint,  shoulder region 05/05/2014  . Muscle weakness (generalized) 05/05/2014    Guadelupe Sabin, OTR/L  [phone]  07/23/2015, 11:49 AM  [facility] [place], [place], [address] Phone: [phone]   Fax:  [phone]

## 2015-07-27 DIAGNOSIS — M25511 Pain in right shoulder: Secondary | ICD-10-CM | POA: Diagnosis not present

## 2015-07-27 DIAGNOSIS — F17201 Nicotine dependence, unspecified, in remission: Secondary | ICD-10-CM | POA: Diagnosis not present

## 2015-07-27 DIAGNOSIS — Z681 Body mass index (BMI) 19 or less, adult: Secondary | ICD-10-CM | POA: Diagnosis not present

## 2015-07-28 ENCOUNTER — Ambulatory Visit (HOSPITAL_COMMUNITY): Payer: Medicare Other

## 2015-07-30 ENCOUNTER — Ambulatory Visit (HOSPITAL_COMMUNITY): Payer: Medicare Other | Admitting: Occupational Therapy

## 2015-08-04 ENCOUNTER — Ambulatory Visit (HOSPITAL_COMMUNITY): Payer: Medicare Other | Attending: Orthopaedic Surgery

## 2015-08-04 ENCOUNTER — Encounter (HOSPITAL_COMMUNITY): Payer: Self-pay

## 2015-08-04 DIAGNOSIS — M629 Disorder of muscle, unspecified: Secondary | ICD-10-CM | POA: Insufficient documentation

## 2015-08-04 DIAGNOSIS — M25611 Stiffness of right shoulder, not elsewhere classified: Secondary | ICD-10-CM

## 2015-08-04 DIAGNOSIS — R531 Weakness: Secondary | ICD-10-CM | POA: Insufficient documentation

## 2015-08-04 DIAGNOSIS — M6281 Muscle weakness (generalized): Secondary | ICD-10-CM | POA: Insufficient documentation

## 2015-08-04 DIAGNOSIS — M6289 Other specified disorders of muscle: Secondary | ICD-10-CM

## 2015-08-05 NOTE — Therapy (Signed)
[place], [place], [address] Phone: [phone]   Fax:  [phone]  Occupational Therapy Treatment  Patient Details  Name: Aaron Spence MRN: [id number] Date of Birth: [birth year] Referring Provider:  Sanjuana Kava, MD  Encounter Date: 08/04/2015    History reviewed. No pertinent past medical history.  No past surgical history on file.  There were no vitals filed for this visit.  Visit Diagnosis:  Weakness generalized  Shoulder stiffness, right  Tight fascia  Muscle weakness (generalized)      Subjective Assessment - 08/04/15 1457    Subjective  S: My arm's doing good. Sometimes it hurts. It comes and goes.   Currently in Pain? No/denies            [facility] OT Assessment - 08/04/15 1456    Assessment   Diagnosis Right shoulder pain   Precautions   Precautions None         08/05/15 1203  OT Visits / Re-Eval  Visit Number 3  Number of Visits 8  Date for OT Re-Evaluation 09/13/15 (Mini reassess: 08/12/15)  Authorization  Authorization Type Medicare primary Medicaid secondary 07/15/15: Reviewed insurance coverage with patient.  Authorization Time Period before 10th visit  Authorization - Visit Number 3  Authorization - Number of Visits 10  OT Time Calculation  OT Start Time 1430  OT Stop Time 1515  OT Time Calculation (min) 45 min  End of Session  Activity Tolerance Patient tolerated treatment well  Behavior During Therapy WFL for tasks assessed/performed              OT Treatments/Exercises (OP) - 08/04/15 1457    Exercises   Exercises Shoulder   Shoulder Exercises: Supine   Protraction PROM;10 reps;AROM;12 reps   Horizontal ABduction PROM;10 reps;AROM;12 reps   External Rotation PROM;10 reps;AROM;12 reps   Internal Rotation PROM;10 reps;AROM;12 reps   Flexion PROM;10 reps;AROM;12 reps   ABduction PROM;10 reps;AROM;12 reps   Shoulder Exercises: Standing   Protraction  AROM;12 reps   Horizontal ABduction AROM;12 reps   External Rotation AROM;12 reps   Internal Rotation AROM;12 reps   Flexion AROM;12 reps   ABduction AROM;12 reps   Shoulder Exercises: ROM/Strengthening   Proximal Shoulder Strengthening, Supine 12X no rest breaks   Manual Therapy   Manual Therapy Myofascial release   Myofascial Release Myofascial release and manual stretching to right upper arm and anterior deltoid region to decrease fascial restrictions and pain, and increase joint range of motion.                   OT Short Term Goals - 07/23/15 1148    OT SHORT TERM GOAL #1   Title Patient will be independent and educated on HEP.   Time 4   Period Weeks   Status On-going   OT SHORT TERM GOAL #2   Title Patient will decrease pain to a 5/10 or less during daily tasks.    Time 4   Period Weeks   Status On-going   OT SHORT TERM GOAL #3   Title Patient will increase shoulder flexion and abduction A/ROM by 20 degrees to increase ability to complete overhead activities.    Time 4   Period Weeks   Status On-going   OT SHORT TERM GOAL #4   Title Patient will decrease fascial restrictions to trace amount.    Time 4   Period Weeks   Status On-going   OT SHORT TERM GOAL #  5   Title Patient will increase posture with the use of scapular strengthening exercises and education.   Time 4   Period Weeks   Status On-going          08/05/15 1204  OT Assessment and Plan  Clinical Impression Statement A: Pt continues to require min-mod verbal cues to complete exercises. Added scapular theraband exercises with patient requiring verbal cues for proper form.  Plan P: Add X to V arms and ball on the wall.             Problem List Patient Active Problem List   Diagnosis Date Noted  . Pain in joint, shoulder region 05/05/2014  . Muscle weakness (generalized) 05/05/2014    Ailene Ravel, OTR/L,CBIS  [phone]  08/05/2015, 12:02 PM  [facility] [address] [place], [place], [address] Phone: [phone]   Fax:  [phone]

## 2015-08-06 ENCOUNTER — Ambulatory Visit (HOSPITAL_COMMUNITY): Payer: Medicare Other | Admitting: Occupational Therapy

## 2015-08-10 ENCOUNTER — Ambulatory Visit (HOSPITAL_COMMUNITY): Payer: Medicare Other | Admitting: Specialist

## 2015-08-10 DIAGNOSIS — F17201 Nicotine dependence, unspecified, in remission: Secondary | ICD-10-CM | POA: Diagnosis not present

## 2015-08-10 DIAGNOSIS — Z681 Body mass index (BMI) 19 or less, adult: Secondary | ICD-10-CM | POA: Diagnosis not present

## 2015-08-10 DIAGNOSIS — M25511 Pain in right shoulder: Secondary | ICD-10-CM | POA: Diagnosis not present

## 2015-08-12 ENCOUNTER — Ambulatory Visit (HOSPITAL_COMMUNITY): Payer: Medicare Other

## 2015-08-12 DIAGNOSIS — M6289 Other specified disorders of muscle: Secondary | ICD-10-CM

## 2015-08-12 DIAGNOSIS — M25611 Stiffness of right shoulder, not elsewhere classified: Secondary | ICD-10-CM | POA: Diagnosis not present

## 2015-08-12 DIAGNOSIS — R531 Weakness: Secondary | ICD-10-CM

## 2015-08-12 DIAGNOSIS — M6281 Muscle weakness (generalized): Secondary | ICD-10-CM

## 2015-08-12 DIAGNOSIS — M629 Disorder of muscle, unspecified: Secondary | ICD-10-CM | POA: Diagnosis not present

## 2015-08-12 NOTE — Therapy (Addendum)
[facility] [address] [place], [place], [address] Phone: [phone]   Fax:  [phone]  Occupational Therapy Treatment and reassessment  Patient Details  Name: OZIAS DICENZO MRN: [id number] Date of Birth: [birth year] Referring Provider:  Sanjuana Kava, MD  Encounter Date: 08/12/2015      OT End of Session - 08/12/15 1239    Visit Number 4   Number of Visits 8   Date for OT Re-Evaluation 09/13/15   Authorization Type Medicare primary Medicaid secondary 07/15/15: Reviewed insurance coverage with patient.   Authorization Time Period before 10th visit   Authorization - Visit Number 4   Authorization - Number of Visits 10   OT Start Time 1100   OT Stop Time 1145   OT Time Calculation (min) 45 min   Activity Tolerance Patient tolerated treatment well   Behavior During Therapy WFL for tasks assessed/performed      No past medical history on file.  No past surgical history on file.  There were no vitals filed for this visit.  Visit Diagnosis:  Weakness generalized  Shoulder stiffness, right  Tight fascia  Muscle weakness (generalized)          [facility] OT Assessment - 08/12/15 1120    Assessment   Diagnosis Right shoulder pain   Precautions   Precautions None   AROM   Overall AROM Comments Assessed seated. Ir/ER adducted   AROM Assessment Site Shoulder   Right/Left Shoulder Right;Left   Right Shoulder Flexion 120 Degrees  previous: 107   Right Shoulder ABduction 120 Degrees  previous: 101   Right Shoulder Internal Rotation 90 Degrees  previous: 90   Right Shoulder External Rotation 90 Degrees  previous: 75   Left Shoulder Flexion 125 Degrees   Left Shoulder ABduction 130 Degrees   Strength   Overall Strength Comments Strength was 5/5 in all shoulder ranges at evaluation.                  OT Treatments/Exercises (OP) - 08/12/15 1128    Exercises   Exercises Shoulder   Shoulder Exercises:  Supine   Protraction PROM;10 reps   Horizontal ABduction PROM;10 reps   External Rotation PROM;10 reps   Internal Rotation PROM;10 reps   Flexion PROM;10 reps   ABduction PROM;10 reps   Shoulder Exercises: Prone   Other Prone Exercises [place] exercises; 10X   Shoulder Exercises: Standing   Extension Theraband;15 reps   Theraband Level (Shoulder Extension) Level 3 (Green)   Row [employer] reps   Theraband Level (Shoulder Row) Level 3 (Green)   Retraction Theraband;15 reps   Theraband Level (Shoulder Retraction) Level 3 (Green)   Manual Therapy   Manual Therapy Myofascial release;Muscle Energy Technique   Myofascial Release Myofascial release and manual stretching to right upper arm and anterior deltoid region to decrease fascial restrictions and pain, and increase joint range of motion.    Muscle Energy Technique Muscle energy technique to right deltoid to decrease muscle spasm and increase ROM.                  OT Short Term Goals - 08/12/15 1123    OT SHORT TERM GOAL #1   Title Patient will be independent and educated on HEP.   Time 4   Period Weeks   Status On-going   OT SHORT TERM GOAL #2   Title Patient will decrease pain to a 5/10 or less during daily tasks.    Time 4  Period Weeks   Status On-going   OT SHORT TERM GOAL #3   Title Patient will increase shoulder flexion and abduction A/ROM by 20 degrees to increase ability to complete overhead activities.    Time 4   Period Weeks   Status On-going   OT SHORT TERM GOAL #4   Title Patient will decrease fascial restrictions to trace amount.    Time 4   Period Weeks   Status On-going   OT SHORT TERM GOAL #5   Title Patient will increase posture with the use of scapular strengthening exercises and education.   Time 4   Period Weeks   Status On-going                  Plan - 08-22-2015 1240    Clinical Impression Statement A: Mini reassessment completed this date. patient has only attended  therapy 3 times in the past 4 weeks. Patient did show an increase in his A/ROM with measurements despite his lack of attendance. Pt states that he notices an increase of shoulder pain depending on what he does with it. An example that patient gave was picking up the trash bag. He will have an increase in pain depending on how heavy the bag is. Measured shoulder flexion and abduction of LUE this date to compare. LUE is unable to achieve full ROM and RUE is almost equal to LUE. Recommend patient continue therapy 1x a week for 4 weeks to work on his A/ROM and scapular strengthening to increase his overall posture and decrease his shoulder pain and fascial restrictions. Pt agrees. Pt is also only able to make one appointment at a time due to his previous 2 no shows. pt understands. manual therapy was done seperately from other treatment this date .   OT Frequency 1x / week   OT Duration 4 weeks   Plan P: Add X to V arm and ball on the wall. Continue to focus on exercises that will increase his scapular strength and posture. Complete FOTO.          G-Codes - 08/22/15 1243    Functional Assessment Tool Used Clinical Judgement   Functional Limitation Carrying, moving and handling objects   Carrying, Moving and Handling Objects Current Status [phone]) At least 40 percent but less than 60 percent impaired, limited or restricted   Carrying, Moving and Handling Objects Goal Status (B6389) At least 20 percent but less than 40 percent impaired, limited or restricted      Problem List Patient Active Problem List   Diagnosis Date Noted  . Pain in joint, shoulder region 05/05/2014  . Muscle weakness (generalized) 05/05/2014  Occupational Therapy Progress Note  Dates of Reporting Period: 07/15/15 to 08/22/15  Objective Reports of Subjective Statement: See clinical impression statement above  Objective Measurements: See measurements above  Goal Update: No goals met this date. Patient is progressing towards  therapy goals.   Plan: See plan above  Reason Skilled Services are Required: Pt required skilled OT services to increase functional performance of RUE during daily tasks.   Ailene Ravel, OTR/L,CBIS  [phone]  2015/08/22, 12:48 PM  [facility] [address] [place], [place], [address] Phone: [phone]   Fax:  [phone]

## 2015-08-20 ENCOUNTER — Ambulatory Visit (HOSPITAL_COMMUNITY): Payer: Medicare Other | Admitting: Occupational Therapy

## 2015-08-20 DIAGNOSIS — M629 Disorder of muscle, unspecified: Secondary | ICD-10-CM

## 2015-08-20 DIAGNOSIS — R531 Weakness: Secondary | ICD-10-CM | POA: Diagnosis not present

## 2015-08-20 DIAGNOSIS — M25611 Stiffness of right shoulder, not elsewhere classified: Secondary | ICD-10-CM

## 2015-08-20 DIAGNOSIS — M6281 Muscle weakness (generalized): Secondary | ICD-10-CM | POA: Diagnosis not present

## 2015-08-20 DIAGNOSIS — M6289 Other specified disorders of muscle: Secondary | ICD-10-CM

## 2015-08-20 NOTE — Therapy (Addendum)
[facility] [place], [place], [address] Phone: [phone]   Fax:  [phone]  Occupational Therapy Treatment  Patient Details  Name: Aaron Spence MRN: [id number] Date of Birth: 05-28-1976 No Data Recorded  Encounter Date: 08/20/2015      OT End of Session - 08/20/15 1154    Visit Number 5   Number of Visits 8   Date for OT Re-Evaluation 09/13/15   Authorization Type Medicare primary Medicaid secondary 07/15/15: Reviewed insurance coverage with patient.   Authorization Time Period before 10th visit   Authorization - Visit Number 5   Authorization - Number of Visits 10   OT Start Time 1114   OT Stop Time 1152   OT Time Calculation (min) 38 min   Activity Tolerance Patient tolerated treatment well   Behavior During Therapy WFL for tasks assessed/performed      No past medical history on file.  No past surgical history on file.  There were no vitals filed for this visit.  Visit Diagnosis:  Weakness generalized  Shoulder stiffness, right  Tight fascia      Subjective Assessment - 08/20/15 1116    Subjective  S: I'm not feeling any pain yet, but I will when you start working on it.    Special Tests FOTO Score: 63/100 (47% impairment)   Currently in Pain? No/denies            [facility] OT Assessment - 08/20/15 1116    Assessment   Diagnosis Right shoulder pain   Precautions   Precautions None                  OT Treatments/Exercises (OP) - 08/20/15 1117    Exercises   Exercises Shoulder   Shoulder Exercises: Supine   Protraction PROM;10 reps   Horizontal ABduction PROM;10 reps   External Rotation PROM;10 reps   Internal Rotation PROM;10 reps   Flexion PROM;10 reps   ABduction PROM;10 reps   Shoulder Exercises: Standing   Extension Theraband;15 reps   Theraband Level (Shoulder Extension) Level 3 (Green)   Row [employer] reps   Theraband Level (Shoulder Row) Level 3 (Green)   Retraction Theraband;15 reps   Theraband Level (Shoulder Retraction) Level 3 (Green)   Shoulder Exercises: ROM/Strengthening   UBE (Upper Arm Bike) Level 1 3' reverse  cuing to remain at 3.0 KPH   X to V Arms 10X   Proximal Shoulder Strengthening, Supine 12X no rest breaks   Ball on Wall 1' flexion 1' abduction   Manual Therapy   Manual Therapy Myofascial release;Muscle Energy Technique   Myofascial Release Myofascial release and manual stretching to right upper arm and anterior deltoid region to decrease fascial restrictions and pain, and increase joint range of motion.    Muscle Energy Technique Muscle energy technique to right deltoid to decrease muscle spasm and increase ROM.                  OT Short Term Goals - 08/12/15 1123    OT SHORT TERM GOAL #1   Title Patient will be independent and educated on HEP.   Time 4   Period Weeks   Status On-going   OT SHORT TERM GOAL #2   Title Patient will decrease pain to a 5/10 or less during daily tasks.    Time 4   Period Weeks   Status On-going   OT SHORT TERM GOAL #3   Title Patient will increase shoulder  flexion and abduction A/ROM by 20 degrees to increase ability to complete overhead activities.    Time 4   Period Weeks   Status On-going   OT SHORT TERM GOAL #4   Title Patient will decrease fascial restrictions to trace amount.    Time 4   Period Weeks   Status On-going   OT SHORT TERM GOAL #5   Title Patient will increase posture with the use of scapular strengthening exercises and education.   Time 4   Period Weeks   Status On-going                  Plan - 08/20/15 1155    Clinical Impression Statement A: Added x to v arms and ball on the wall, UBE. Continued myofascial release and P/ROM exercises. FOTO completed, score was 63/100 (47% impairment). Pt required consistent verbal cuing during scapular theraband exercises for form.    Plan P: Resume prone hugh[place] exercises, scapular stability  exercises.         Problem List Patient Active Problem List   Diagnosis Date Noted  . Pain in joint, shoulder region 05/05/2014  . Muscle weakness (generalized) 05/05/2014    Guadelupe Sabin, OTR/L  [phone]  08/20/2015, 12:01 PM  [facility] [address] [place], [place], [address] Phone: [phone]   Fax:  [phone]  Name: Aaron Spence MRN: [id number] Date of Birth: Mar 13, 1976

## 2015-08-24 ENCOUNTER — Ambulatory Visit (HOSPITAL_COMMUNITY): Payer: Medicare Other | Admitting: Specialist

## 2015-08-26 ENCOUNTER — Encounter (HOSPITAL_COMMUNITY): Payer: Self-pay

## 2015-08-26 ENCOUNTER — Ambulatory Visit (HOSPITAL_COMMUNITY): Payer: Medicare Other

## 2015-08-26 NOTE — Therapy (Signed)
[facility] [place], [place], [address] Phone: [phone]   Fax:  [phone]  Patient Details  Name: Aaron Spence MRN: [id number] Date of Birth: 04/29/1976 Referring Provider:  No ref. provider found  Encounter Date: 08/26/2015  OCCUPATIONAL THERAPY DISCHARGE SUMMARY  Visits from Start of Care: 5  Current functional level related to goals / functional outcomes: OT SHORT TERM GOAL #1    Title Patient will be independent and educated on HEP.   Time 4   Period Weeks   Status On-going   OT SHORT TERM GOAL #2   Title Patient will decrease pain to a 5/10 or less during daily tasks.    Time 4   Period Weeks   Status On-going   OT SHORT TERM GOAL #3   Title Patient will increase shoulder flexion and abduction A/ROM by 20 degrees to increase ability to complete overhead activities.    Time 4   Period Weeks   Status On-going   OT SHORT TERM GOAL #4   Title Patient will decrease fascial restrictions to trace amount.    Time 4   Period Weeks   Status On-going   OT SHORT TERM GOAL #5   Title Patient will increase posture with the use of scapular strengthening exercises and education.   Time 4   Period Weeks   Status On-going        Remaining deficits: Patient had 4 no shows and per our attendance policy will be discharge from therapy. If patient wishes to continue therapy he will need a new MD referral send to our clinic.    Education / Equipment: Theraband strengthening exercises, shoulder stretches. Plan:                                                    Patient goals were not met. Patient is being discharged due to                                                     ?????4 no shows       Ailene Ravel, OTR/L,CBIS  [phone]  08/26/2015, 11:31 AM  [facility] [place], [place], [address] Phone: [phone]   Fax:  [phone]

## 2015-09-01 ENCOUNTER — Encounter (HOSPITAL_COMMUNITY): Payer: Medicare Other | Admitting: Occupational Therapy

## 2015-09-03 ENCOUNTER — Encounter (HOSPITAL_COMMUNITY): Payer: Medicare Other | Admitting: Occupational Therapy

## 2015-09-08 ENCOUNTER — Encounter (HOSPITAL_COMMUNITY): Payer: Medicare Other

## 2015-09-10 ENCOUNTER — Encounter (HOSPITAL_COMMUNITY): Payer: Medicare Other

## 2015-09-11 DIAGNOSIS — M549 Dorsalgia, unspecified: Secondary | ICD-10-CM | POA: Diagnosis not present

## 2015-09-11 DIAGNOSIS — F17200 Nicotine dependence, unspecified, uncomplicated: Secondary | ICD-10-CM | POA: Diagnosis not present

## 2015-09-23 DIAGNOSIS — Z681 Body mass index (BMI) 19 or less, adult: Secondary | ICD-10-CM | POA: Diagnosis not present

## 2015-09-23 DIAGNOSIS — M25511 Pain in right shoulder: Secondary | ICD-10-CM | POA: Diagnosis not present

## 2015-09-23 DIAGNOSIS — F17201 Nicotine dependence, unspecified, in remission: Secondary | ICD-10-CM | POA: Diagnosis not present

## 2015-10-28 DIAGNOSIS — Z681 Body mass index (BMI) 19 or less, adult: Secondary | ICD-10-CM | POA: Diagnosis not present

## 2015-10-28 DIAGNOSIS — F17201 Nicotine dependence, unspecified, in remission: Secondary | ICD-10-CM | POA: Diagnosis not present

## 2015-10-28 DIAGNOSIS — M25511 Pain in right shoulder: Secondary | ICD-10-CM | POA: Diagnosis not present

## 2015-11-11 ENCOUNTER — Emergency Department (HOSPITAL_COMMUNITY)
Admission: EM | Admit: 2015-11-11 | Discharge: 2015-11-11 | Disposition: A | Discharge status: Home / Self Care | Payer: Medicare Other | Attending: Emergency Medicine | Admitting: Emergency Medicine

## 2015-11-11 ENCOUNTER — Encounter (HOSPITAL_COMMUNITY): Payer: Self-pay

## 2015-11-11 ENCOUNTER — Emergency Department (HOSPITAL_COMMUNITY): Payer: Medicare Other

## 2015-11-11 DIAGNOSIS — R0789 Other chest pain: Secondary | ICD-10-CM | POA: Diagnosis not present

## 2015-11-11 DIAGNOSIS — F17210 Nicotine dependence, cigarettes, uncomplicated: Secondary | ICD-10-CM | POA: Insufficient documentation

## 2015-11-11 DIAGNOSIS — R079 Chest pain, unspecified: Secondary | ICD-10-CM | POA: Diagnosis present

## 2015-11-11 DIAGNOSIS — J209 Acute bronchitis, unspecified: Secondary | ICD-10-CM | POA: Insufficient documentation

## 2015-11-11 DIAGNOSIS — R05 Cough: Secondary | ICD-10-CM | POA: Diagnosis not present

## 2015-11-11 HISTORY — DX: Tobacco use: Z72.0

## 2015-11-11 LAB — I-STAT TROPONIN, ED: TROPONIN I, POC: 0 ng/mL (ref 0.00–0.08)

## 2015-11-11 LAB — I-STAT CHEM 8, ED
BUN: 13 mg/dL (ref 6–20)
CHLORIDE: 102 mmol/L (ref 101–111)
Calcium, Ion: 1.2 mmol/L (ref 1.12–1.23)
Creatinine, Ser: 0.9 mg/dL (ref 0.61–1.24)
GLUCOSE: 113 mg/dL — AB (ref 65–99)
HCT: 42 % (ref 39.0–52.0)
HEMOGLOBIN: 14.3 g/dL (ref 13.0–17.0)
POTASSIUM: 3.6 mmol/L (ref 3.5–5.1)
Sodium: 138 mmol/L (ref 135–145)
TCO2: 26 mmol/L (ref 0–100)

## 2015-11-11 MED ORDER — PREDNISONE 10 MG PO TABS
60.0000 mg | ORAL_TABLET | Freq: Once | ORAL | Status: AC
  Administered 2015-11-11: 60 mg via ORAL
  Filled 2015-11-11: qty 1

## 2015-11-11 MED ORDER — ALBUTEROL SULFATE HFA 108 (90 BASE) MCG/ACT IN AERS
2.0000 | INHALATION_SPRAY | RESPIRATORY_TRACT | Status: AC
  Administered 2015-11-11: 2 via RESPIRATORY_TRACT
  Filled 2015-11-11: qty 6.7

## 2015-11-11 MED ORDER — IPRATROPIUM-ALBUTEROL 0.5-2.5 (3) MG/3ML IN SOLN
3.0000 mL | Freq: Once | RESPIRATORY_TRACT | Status: AC
  Administered 2015-11-11: 3 mL via RESPIRATORY_TRACT
  Filled 2015-11-11: qty 3

## 2015-11-11 MED ORDER — PREDNISONE 20 MG PO TABS: 40.0000 mg | ORAL_TABLET | Freq: Every day | ORAL | Status: DC

## 2015-11-11 MED ORDER — ALBUTEROL SULFATE (2.5 MG/3ML) 0.083% IN NEBU
2.5000 mg | INHALATION_SOLUTION | Freq: Once | RESPIRATORY_TRACT | Status: AC
  Administered 2015-11-11: 2.5 mg via RESPIRATORY_TRACT
  Filled 2015-11-11: qty 3

## 2015-11-11 MED ORDER — DOXYCYCLINE HYCLATE 100 MG PO TABS
100.0000 mg | ORAL_TABLET | Freq: Two times a day (BID) | ORAL | Status: DC
Start: 2015-11-11 — End: 2015-12-08

## 2015-11-11 NOTE — ED Notes (Signed)
Patient refuses to have blood drawn. MD aware.

## 2015-11-11 NOTE — Discharge Instructions (Signed)
°Emergency Department Resource Guide °1) Find a Doctor and Pay Out of Pocket °Although you won't have to find out who is covered by your insurance plan, it is a good idea to ask around and get recommendations. You will then need to call the office and see if the doctor you have chosen will accept you as a new patient and what types of options they offer for patients who are self-pay. Some doctors offer discounts or will set up payment plans for their patients who do not have insurance, but you will need to ask so you aren't surprised when you get to your appointment. ° °2) Contact Your Local Health Department °Not all health departments have doctors that can see patients for sick visits, but many do, so it is worth a call to see if yours does. If you don't know where your local health department is, you can check in your phone book. The CDC also has a tool to help you locate your state's health department, and many state websites also have listings of all of their local health departments. ° °3) Find a Walk-in Clinic °If your illness is not likely to be very severe or complicated, you may want to try a walk in clinic. These are popping up all over the country in pharmacies, drugstores, and shopping centers. They're usually staffed by nurse practitioners or physician assistants that have been trained to treat common illnesses and complaints. They're usually fairly quick and inexpensive. However, if you have serious medical issues or chronic medical problems, these are probably not your best option. ° °No Primary Care Doctor: °- Call [facility] at  832-8000 - they can help you locate a primary care doctor that  accepts your insurance, provides certain services, etc. °- Physician Referral Service- 1-800-533-3463 ° °Chronic Pain Problems: °Organization         Address  Phone   Notes  °[facility]  (336) 297-2271 Patients need to be referred by their primary care doctor.  ° °Medication  Assistance: °Organization         Address  Phone   Notes  °[place] [facility] [address] °[place], [place] [address] (336) 641-8030 --Must be a resident of [place] °-- Must have NO insurance coverage whatsoever (no Medicaid/ Medicare, etc.) °-- The pt. MUST have a primary care doctor that directs their care regularly and follows them in the community °  °[facility]  (866) 331-1348   °[employer]  (888) 892-1162   ° °Agencies that provide inexpensive medical care: °Organization         Address  Phone   Notes  °[facility]  (336) 832-8035   °[facility]    (336) 832-7272   °[facility] [address] °[place], [place] [address] (336) 832-4777   °[facility] [address], °[place] (336) 271-4999   °[facility]    (336) 373-0678   °[facility]    (336) 272-1050   °[facility] ° [address], [place] Phone:  (336) 832-4444, Fax:  (336) 832-4440 Hours of Operation:  9 am - 6 pm, M-F.  Also accepts Medicaid/Medicare and self-pay.  °[facility] ° [address], [place] Phone: (336) 832-3150, Fax: (336) 832-3151. Hours of Operation:  8:30 am - 5:30 pm, M-F.  Also accepts Medicaid and self-pay.  °[facility] [place] [address], [place] Phone: (336) 878-6027   °[facility] [address], [place], [place] (336)723-1848, Ext. 123 Mondays & Thursdays: 7-9 AM.  First 15 patients are seen on a first come, first serve basis. °  ° °Medicaid-accepting [place] Providers: ° °Organization         Address  Phone   Notes  °[facility] [address], [place] (336) 641-2100 Also accepts self-pay patients.  °[facility] [address], [place] ° (336) 856-9996   °[facility] [address], [place]  (336) 288-8857   °[facility] [address] [place] [address], [place] (336) 299-7000   °Veita Bland [address], [place]  ° (336) 373-1557 Only accepts [place] Access Medicaid patients after they have their name applied to their card.  ° °Self-Pay (no insurance) in [place]: ° °Organization         Address  Phone   Notes  °Sickle Cell Patients, [facility] [address], [place] (336) 832-1970   °[facility] [address], [place] (336) 832-4400   °[facility] ° [address], [place] (336) 992-4800   °[facility]/Dr. Osei-Bonsu ° [address] [place] [address], [place] or [address], [place] (336) 841-8500 Phone number for both [place] and [place] locations is the same.  °[facility] [address], [place] (336) 299-0000   °[facility] [address] [place] [address], [place] or [address] (336) 852-7530 °(336) 878-2260   °[facility] [address], [place] (336) 350-1642, phone; (336) 294-5005, fax Sees patients 1st and 3rd Saturday of every month.  Must not qualify for public or private insurance (i.e. Medicaid, Medicare, [place] Health Choice, Veterans' Benefits) • Household income should be no more than 200% of the poverty level •The clinic cannot treat you if you are pregnant or think you are pregnant • Sexually transmitted diseases are not treated at the clinic.  ° ° °Dental Care: °Organization         Address  Phone  Notes  °[place] [facility] [address], [place] (336) 641-6152 Accepts children up to age 21 who are enrolled in Medicaid or [place] Health Choice; pregnant women with a Medicaid card; and children who have applied for Medicaid or [place] Health Choice, but were declined, whose parents can pay a reduced fee at time of service.  °[place]  [facility] [place]  [address], [place] (336) 641-7733 Accepts children up to age 21 who are enrolled in Medicaid or [place] Health Choice; pregnant women with a Medicaid card; and children who have applied for Medicaid or [place] Health Choice, but were declined, whose parents can pay a reduced fee at time of service.  °[facility] ° [address], [place] (336) 641-4533 Patients are seen by appointment only. Walk-ins are not accepted. [facility] will see patients 18 years of age and older. °Monday - Tuesday (8am-5pm) °Most Wednesdays (8:30-5pm) °$30 per visit, cash only  °[facility] ° [address], [place] (336) 641-4533 Patients are seen by appointment only. Walk-ins are not accepted. [facility] will see patients 18 years of age and older. °One   Wednesday Evening (Monthly: Volunteer Based).  $30 per visit, cash only  °[facility]  (919) 537-3737 for adults; Children under age 4, call Graduate Pediatric Dentistry at (919) [phone]. Children aged 4-14, please call (919) [phone] to request a pediatric application. ° Dental services are provided in all areas of dental care including fillings, crowns and bridges, complete and partial dentures, implants, gum treatment, root canals, and extractions. Preventive care is also provided. Treatment is provided to both adults and children. °Patients are selected via a lottery and there is often a waiting list. °  °[facility] [address], °[place] ° (336) 763-8833 [email or web address] °  °[facility] [address], [place], [place] (336)723-1848, Ext. 123 Second and Fourth Thursday of each month, opens at 6:30 AM; Clinic ends at 9 AM.  Patients are seen on a first-come first-served basis, and a limited number are seen during each clinic.  ° °[facility] ° [address], [place], [place] (336) 723-7904    Eligibility Requirements °You must have lived in [place], [place], or [place] counties for at least the last three months. °  You cannot be eligible for state or federal sponsored healthcare insurance, including Veterans Administration, Medicaid, or Medicare. °  You generally cannot be eligible for healthcare insurance through your employer.  °  How to apply: °Eligibility screenings are held every Tuesday and Wednesday afternoon from 1:00 pm until 4:00 pm. You do not need an appointment for the interview!  °[facility] [address], [place], [place] 336-631-2330   °[facility]  336-342-8273   °[facility]  336-703-3100   °[facility]  336-570-6415   ° °Behavioral Health Resources in the Community: °Intensive Outpatient Programs °Organization         Address  Phone  Notes  °[facility] [address], [place], [place] 336-878-6098   °[facility] [address], [place], [place] 336-832-9800   °[facility] [address], [place], [place] ° 336-882-2125   °[facility] [address],  °[place], [place] 1-800-853-5163 or 336-641-4981   °Substance Abuse Resources °Organization         Address  Phone  Notes  °[facility]  336-882-2125   °[facility]  336-784-9470   °[facility]  336-285-9073   °[facility]  336-845-3988   °Residential & Outpatient Substance Abuse Program  1-800-659-3381   °Psychological Services °Organization         Address  Phone  Notes  °[facility]  336- 832-9600   °[facility]  336- 378-7881   °[facility] [address], [place] 1-800-853-5163 or 336-641-4981   ° °Mobile Crisis Teams °Organization         Address  Phone  Notes  °[facility], Mobile Crisis Care Unit  1-877-626-1772   °Assertive °[facility] ° [address]  [place], [place] 336-834-9664   °Sharon DeEsch [address] °[place] 336-554-5454   ° °Self-Help/Support Groups °Organization         Address  Phone             Notes  °[facility]  - variety of support groups  336- 373-1402 Call for more information  °Narcotics Anonymous (NA), [facility] [address], °[place]  2 meetings at this location  ° °  Residential Treatment Programs °Organization         Address  Phone  Notes  °[facility] [address],    °[place]  1-866-801-8205   °[facility] ° [address], [place], [place] 704-293-8524   °[facility] [address], [place] 336-845-3988 Admissions: 8am-3pm M-F  °[facility] [address]    °[place], [place] 336-841-1104   °[facility] [address], [place], [place] 336-379-7146   °[facility] [address]  °[place], [place] 336-285-9073   °[facility] Intensive Outpatient [address], [place], [place] 336-852-3033   °[facility] [address]  °[place], [place] 1-877-615-2722 or 336-784-9470   °[facility] [address] [place], [place] 336-227-7417 Accepts Medicaid  °[facility] [address]  °[place] 1-800-659-3381 Substance Abuse/Addiction Treatment  ° °[place] Behavioral Health Resources °Organization         Address  Phone  Notes  °[facility]  (888) 581-9988   °Julie Brannon, PhD [address] [place], [place]   (336) 349-5553 or (336) 951-0000   °[facility]   [address] °[place], [place] (336) 349-4454   °[facility] [address], [place], [place] (336) 342-8316 Insurance/Medicaid/sponsorship through [facility]  °[facility] [address]                                    [place], [place] (336) 342-8316 Therapy/tele-psych/case    °[facility] [address]  ° [place], [place] (336) 349-2233    °Dr. Arfeen  (336) 349-4544   °[facility] [place]  [employer] [place] [facility] 1) [address], [place] °2) [address], [place] °3)  [address], [place] (336) 349-3220 °(336) 342-7768 ° °(336) 342-8140   °[place] [facility] (336) 342-1394 or (336) 342-3537 (After Hours)    ° ° °Take the prescriptions as directed.  Use your albuterol inhaler (2 to 4 puffs) every 4 hours for the next 7 days, then as needed for cough, wheezing, or shortness of breath.  Call your regular medical doctor tomorrow morning to schedule a follow up appointment within the next 2 to 3 days.  Return to the Emergency Department immediately sooner if worsening.  ° °

## 2015-11-11 NOTE — ED Provider Notes (Signed)
CSN: [id number]     Arrival date & time 11/11/15  1643 History   First MD Initiated Contact with Patient 11/11/15 1849     Chief Complaint  Patient presents with  . Chest Pain      HPI  Pt was seen at 1900.  Per pt, c/o gradual onset and persistence of constant generalized chest "pain" that began last night approximately 2100/2200. Pt describes the CP as "tightness," and constant. Has been associated with coughing. Denies palpitations, no SOB, no abd pain, no N/V/D, no back pain, no fevers, no rash, no injury.     Past Medical History  Diagnosis Date  . Tobacco use    History reviewed. No pertinent past surgical history.    Social History  Substance Use Topics  . Smoking status: Current Every Day Smoker -- 1.50 packs/day for 20 years    Types: Cigarettes  . Smokeless tobacco: Never Used  . Alcohol Use: No    Review of Systems ROS: Statement: All systems negative except as marked or noted in the HPI; Constitutional: Negative for fever and chills. ; ; Eyes: Negative for eye pain, redness and discharge. ; ; ENMT: Negative for ear pain, hoarseness, nasal congestion, sinus pressure and sore throat. ; ; Cardiovascular: Negative for palpitations, diaphoresis, dyspnea and peripheral edema. ; ; Respiratory: +cough. Negative for wheezing and stridor. ; ; Gastrointestinal: Negative for nausea, vomiting, diarrhea, abdominal pain, blood in stool, hematemesis, jaundice and rectal bleeding. . ; ; Genitourinary: Negative for dysuria, flank pain and hematuria. ; ; Musculoskeletal: +CP. Negative for back pain and neck pain. Negative for swelling and trauma.; ; Skin: Negative for pruritus, rash, abrasions, blisters, bruising and skin lesion.; ; Neuro: Negative for headache, lightheadedness and neck stiffness. Negative for weakness, altered level of consciousness , altered mental status, extremity weakness, paresthesias, involuntary movement, seizure and syncope.      Allergies  Review of patient's  allergies indicates no known allergies.  Home Medications   Prior to Admission medications   Medication Sig Start Date End Date Taking? Authorizing Provider  HYDROcodone-acetaminophen (NORCO) 10-325 MG per tablet Take 1 tablet by mouth every 6 (six) hours as needed.    Historical Provider, MD  HYDROcodone-acetaminophen (NORCO) 7.5-325 MG tablet Take 1 tablet by mouth every 4 (four) hours as needed. pain 10/28/15   Historical Provider, MD  naproxen (NAPROSYN) 500 MG tablet Take 1 tablet (500 mg total) by mouth 2 (two) times daily. 10/16/14   Fredia Sorrow, MD   BP 110/72 mmHg  Pulse 99  Temp(Src) 97.5 F (36.4 C) (Oral)  Resp 16  Ht 5\' 7"  (1.702 m)  Wt 115 lb (52.164 kg)  BMI 18.01 kg/m2  SpO2 98% Physical Exam  1905: Physical examination:  Nursing notes reviewed; Vital signs and O2 SAT reviewed;  Constitutional: Well developed, Well nourished, Well hydrated, In no acute distress; Head:  Normocephalic, atraumatic; Eyes: EOMI, PERRL, No scleral icterus; ENMT: Mouth and pharynx normal, Mucous membranes moist; Neck: Supple, Full range of motion, No lymphadenopathy; Cardiovascular: Regular rate and rhythm, No murmur, rub, or gallop; Respiratory: Breath sounds diminished & equal bilaterally, faint scattered exp wheezes.  Speaking full sentences with ease, Normal respiratory effort/excursion; Chest: Nontender, Movement normal; Abdomen: Soft, Nontender, Nondistended, Normal bowel sounds; Genitourinary: No CVA tenderness; Extremities: Pulses normal, No tenderness, No edema, No calf edema or asymmetry.; Neuro: AA&Ox3, Major CN grossly intact.  Speech clear. No gross focal motor or sensory deficits in extremities.; Skin: Color normal, Warm, Dry.   ED  Course  Procedures (including critical care time) Labs Review  Imaging Review  I have personally reviewed and evaluated these images and lab results as part of my medical decision-making.   EKG Interpretation   Date/Time:  Wednesday November 11 2015 16:50:32 EST Ventricular Rate:  110 PR Interval:  122 QRS Duration: 82 QT Interval:  316 QTC Calculation: 427 R Axis:   89 Text Interpretation:  Sinus tachycardia Otherwise normal ECG No old  tracing to compare Confirmed by [facility]  MD, Nunzio Cory [phone]) on 11/11/2015  7:14:56 PM      MDM  MDM Reviewed: previous chart, nursing note and vitals Reviewed previous: labs Interpretation: labs, ECG and x-ray      Results for orders placed or performed during the hospital encounter of 11/11/15  I-stat Chem 8, ED  Result Value Ref Range   Sodium 138 135 - 145 mmol/L   Potassium 3.6 3.5 - 5.1 mmol/L   Chloride 102 101 - 111 mmol/L   BUN 13 6 - 20 mg/dL   Creatinine, Ser 0.90 0.61 - 1.24 mg/dL   Glucose, Bld 113 (H) 65 - 99 mg/dL   Calcium, Ion 1.20 1.12 - 1.23 mmol/L   TCO2 26 0 - 100 mmol/L   Hemoglobin 14.3 13.0 - 17.0 g/dL   HCT 42.0 39.0 - 52.0 %  I-stat troponin, ED  Result Value Ref Range   Troponin i, poc 0.00 0.00 - 0.08 ng/mL   Comment 3           Dg Chest 2 View 11/11/2015  CLINICAL DATA:  Onset of chest tightness last night, symptoms worse today, cough, current smoker. EXAM: CHEST  2 VIEW COMPARISON:  PA chest x-ray associated with an abdominal series dated August 02, 2013 FINDINGS: The lungs are mildly hyperinflated. There is subtle patchy density that projects in the right lateral costophrenic gutter on the frontal image. Minimal density in a similar location on the left is present. There is no pleural effusion. No alveolar infiltrates are demonstrated. The heart and pulmonary vascularity are normal. The mediastinum is normal in width. There is no pneumothorax. The bony thorax exhibits no acute abnormality. IMPRESSION: COPD. Patchy density at both lung bases suggests subsegmental atelectasis. No air bronchograms are observed. Follow-up radiographs following anticipated antibiotic therapy are recommended to assure clearing. Electronically Signed   By: David  Martinique M.D.    On: 11/11/2015 17:00    2120:  Doubt PE as cause for symptoms with low risk [place].  Doubt ACS as cause for symptoms with normal troponin and EKG after 2 days of constant symptoms.  Pt states he "feels better" after neb and steroid.  NAD, lungs CTA bilat, no wheezing, resps easy, speaking full sentences, Sats 99% R/A.  Pt states he wants to go home now. Tx COPD exacerbation. Dx and testing d/w pt and family.  Questions answered.  Verb understanding, agreeable to d/c home with outpt f/u.    Francine Graven, DO 11/14/15 1600

## 2015-11-11 NOTE — Progress Notes (Signed)
Pt hr increased from 105 to 132 from breathing treatment right at the end but dropped back down but then pt expressed that he down like needles when lab in to draw blood which cause his heart rate to increase.

## 2015-11-11 NOTE — ED Notes (Signed)
Pt reports chest tightness since last night.  Reports worse at work today.  States has had a cough recently but says coughing doesn't make the pain worse.  Denies SOB.

## 2015-12-08 ENCOUNTER — Encounter: Payer: Self-pay | Admitting: Orthopaedic Surgery

## 2015-12-08 ENCOUNTER — Ambulatory Visit (INDEPENDENT_AMBULATORY_CARE_PROVIDER_SITE_OTHER): Payer: Medicare Other | Admitting: Orthopaedic Surgery

## 2015-12-08 ENCOUNTER — Ambulatory Visit (INDEPENDENT_AMBULATORY_CARE_PROVIDER_SITE_OTHER): Payer: Medicare Other

## 2015-12-08 VITALS — BP 119/52 | HR 98 | Ht 67.0 in | Wt 115.0 lb

## 2015-12-08 DIAGNOSIS — M25511 Pain in right shoulder: Secondary | ICD-10-CM | POA: Insufficient documentation

## 2015-12-08 MED ORDER — HYDROCODONE-ACETAMINOPHEN 7.5-325 MG PO TABS: 1.0000 | ORAL_TABLET | ORAL | Status: DC | PRN

## 2015-12-08 NOTE — Progress Notes (Signed)
Patient [id number] Aaron Spence, male DOB:[birth year], 40 y.o. [id number]  Chief Complaint  Patient presents with  . Follow-up    Right shoulder no better and back pain    HPI  Aaron Spence is a 40 y.o. male who has chronic pain of the right shoulder.  Shoulder Pain  The pain is present in the right shoulder. This is a chronic problem. The current episode started more than 1 year ago. There has been no history of extremity trauma. The problem occurs daily. The problem has been gradually worsening. The quality of the pain is described as aching and dull. The pain is at a severity of 4/10. The pain is moderate. Pertinent negatives include no fever, joint swelling, numbness or tingling. The symptoms are aggravated by activity and cold. He has tried acetaminophen, heat, NSAIDS and OTC pain meds for the symptoms. The treatment provided moderate relief.    Review of Systems Review of Systems  Constitutional: Negative for fever.  Respiratory: Positive for cough and wheezing.   Musculoskeletal: Positive for arthralgias (right shoulder pain).  Neurological: Negative for tingling and numbness.  All other systems reviewed and are negative.   Past Medical History  Diagnosis Date  . Tobacco use   . Bronchitis     History reviewed. No pertinent past surgical history.  Family History  Problem Relation Age of Onset  . Bronchiolitis      Social History Social History  Substance Use Topics  . Smoking status: Current Every Day Smoker -- 1.50 packs/day for 20 years    Types: Cigarettes  . Smokeless tobacco: Never Used  . Alcohol Use: No    No Known Allergies  Current Outpatient Prescriptions  Medication Sig Dispense Refill  . HYDROcodone-acetaminophen (NORCO) 7.5-325 MG tablet Take 1 tablet by mouth every 4 (four) hours as needed. pain  0  . naproxen (NAPROSYN) 500 MG tablet Take 1 tablet (500 mg total) by mouth 2 (two) times daily. 14 tablet 0   No current facility-administered  medications for this visit.     Physical Exam  Blood pressure 119/52, pulse 98, height 5\' 7"  (1.702 m), weight 115 lb (52.164 kg).  Constitutional: overall normal hygiene, normal nutrition, well developed, normal grooming, normal body habitus. Assistive device:none  Musculoskeletal: gait and station Limp none, muscle tone and strength are normal, no tremors or atrophy is present.  .  Neurological: coordination overall normal.  Deep tendon reflex/nerve stretch intact.  Sensation normal.  Cranial nerves II-XII intact.   Skin:normal overall no scars, lesions, ulcers or rash es. No psoriasis.  Psychiatric: Alert and oriented x 3.  Recent memory intact, remote memory unclear.  Normal mood and affect. Well groomed.  Good eye contact.  Cardiovascular: overall no swelling, no varicosities, no edema bilaterally, normal temperatures of the legs and arms, no clubbing, cyanosis and good capillary refill.  Lymphatic: palpation is normal.  Encounter Diagnosis  Name Primary?  . Right shoulder pain Yes     Extremities:Right shoulder tender with motion.  He has no trauma, no redness. Inspection glenohumeral tenderness more with overhead motion.  No crepitus Strength and tone normal Range of motion full but tender with full overhead motion and with resisted abduction.  Additional services performed: x-rays of the right shoulder  X-rays of the right shoulder shows the humerus is well located in the glenoid.  No fracture or loose body is seen.  Bone quality is good. IMPRESSION:  Negative x-rays of the right shoulder.   PLAN Call if any  problems.  Precautions discussed.  Continue current medications.   Return to clinic 6 weeks.

## 2015-12-08 NOTE — Patient Instructions (Addendum)
Continue medicine Wear coat in cooler weather to help with discomfort Do exercises at home.

## 2015-12-29 ENCOUNTER — Ambulatory Visit (INDEPENDENT_AMBULATORY_CARE_PROVIDER_SITE_OTHER): Payer: Medicare Other | Admitting: Orthopaedic Surgery

## 2015-12-29 VITALS — BP 127/72 | HR 95 | Temp 99.0°F | Ht 67.0 in | Wt 116.0 lb

## 2015-12-29 DIAGNOSIS — M25511 Pain in right shoulder: Secondary | ICD-10-CM | POA: Diagnosis not present

## 2015-12-29 NOTE — Patient Instructions (Signed)
Continue exercises and take medicine

## 2015-12-29 NOTE — Progress Notes (Signed)
Patient [name] Aaron Spence, male DOB:May 06, 1976, 40 y.o. [id number]  Chief Complaint  Patient presents with  . Follow-up    Right shoulder    HPI  Aaron Spence is a 40 y.o. male who has chronic right shoulder pain.  He has no new trauma, no redness, no paresthesias.  Shoulder Pain  The pain is present in the right shoulder. This is a chronic problem. The current episode started more than 1 year ago. There has been no history of extremity trauma. The problem occurs daily. The problem has been waxing and waning. The quality of the pain is described as aching and dull. The pain is at a severity of 3/10. The pain is mild. He has tried cold, heat, movement, acetaminophen, chondroitin, oral narcotics and rest for the symptoms. The treatment provided mild relief.    Body mass index is 18.16 kg/(m^2).   Review of Systems  Constitutional:       Patient does not have Diabetes Mellitus. Patient has hypertension. Patient has COPD or shortness of breath. Patient does not have BMI > 35. Patient has current smoking history.  Musculoskeletal: Positive for arthralgias.    Past Medical History  Diagnosis Date  . Tobacco use   . Bronchitis     No past surgical history on file.  Family History  Problem Relation Age of Onset  . Bronchiolitis      Social History Social History  Substance Use Topics  . Smoking status: Current Every Day Smoker -- 1.50 packs/day for 20 years    Types: Cigarettes  . Smokeless tobacco: Never Used  . Alcohol Use: No    No Known Allergies  Current Outpatient Prescriptions  Medication Sig Dispense Refill  . HYDROcodone-acetaminophen (NORCO) 7.5-325 MG tablet Take 1 tablet by mouth every 4 (four) hours as needed for moderate pain. 120 tablet 0  . naproxen (NAPROSYN) 500 MG tablet Take 1 tablet (500 mg total) by mouth 2 (two) times daily. 14 tablet 0   No current facility-administered medications for this visit.     Physical Exam  Blood pressure  127/72, pulse 95, temperature 99 F (37.2 C), height 5\' 7"  (1.702 m), weight 116 lb (52.617 kg).  Constitutional: overall normal hygiene, normal nutrition, well developed, normal grooming, normal body habitus. Assistive device:none  Musculoskeletal: gait and station Limp none, muscle tone and strength are normal, no tremors or atrophy is present.  .  Neurological: coordination overall normal.  Deep tendon reflex/nerve stretch intact.  Sensation normal.  Cranial nerves II-XII intact.   Skin:   normal overall no scars, lesions, ulcers or rashes. No psoriasis.  Psychiatric: Alert and oriented x 3.  Recent memory intact, remote memory unclear.  Normal mood and affect. Well groomed.  Good eye contact.  Cardiovascular: overall no swelling, no varicosities, no edema bilaterally, normal temperatures of the legs and arms, no clubbing, cyanosis and good capillary refill.  Lymphatic: palpation is normal. Examination of right Upper Extremity is done.  Inspection:   Overall:  Elbow non-tender without crepitus or defects, forearm non-tender without crepitus or defects, wrist non-tender without crepitus or defects, hand non-tender.    Shoulder: with glenohumeral joint tenderness, without effusion.   Upper arm: without swelling and tenderness   Range of motion:   Overall:  Full range of motion of the elbow, full range of motion of wrist and full range of motion in fingers.   Shoulder:  right  160 degrees forward flexion; 145 degrees abduction; 35 degrees internal rotation, 35 degrees  external rotation, 20 degrees extension, 40 degrees adduction.   Stability:   Overall:  Shoulder, elbow and wrist stable   Strength and Tone:   Overall full shoulder muscles strength, full upper arm strength and normal upper arm bulk and tone.  Left shoulder with full motion and no pain  The patient has been educated about the nature of the problem(s) and counseled on treatment options.  The patient appeared to  understand what I have discussed and is in agreement with it.  PLAN Call if any problems.  Precautions discussed.  Continue current medications.   Return to clinic one month

## 2016-01-05 ENCOUNTER — Telehealth: Payer: Self-pay | Admitting: Orthopaedic Surgery

## 2016-01-05 MED ORDER — HYDROCODONE-ACETAMINOPHEN 7.5-325 MG PO TABS: 1.0000 | ORAL_TABLET | ORAL | Status: DC | PRN

## 2016-01-05 NOTE — Telephone Encounter (Signed)
Patient requesting Hydrocodone 7.5/325mg  Qty 120 Tablets

## 2016-01-05 NOTE — Telephone Encounter (Signed)
Rx done. 

## 2016-01-25 ENCOUNTER — Encounter (HOSPITAL_COMMUNITY): Payer: Self-pay | Admitting: Emergency Medicine

## 2016-01-25 ENCOUNTER — Emergency Department (HOSPITAL_COMMUNITY)
Admission: EM | Admit: 2016-01-25 | Discharge: 2016-01-25 | Disposition: A | Discharge status: Home / Self Care | Payer: Medicare Other | Attending: Emergency Medicine | Admitting: Emergency Medicine

## 2016-01-25 DIAGNOSIS — F17210 Nicotine dependence, cigarettes, uncomplicated: Secondary | ICD-10-CM | POA: Diagnosis not present

## 2016-01-25 DIAGNOSIS — J40 Bronchitis, not specified as acute or chronic: Secondary | ICD-10-CM | POA: Insufficient documentation

## 2016-01-25 DIAGNOSIS — J069 Acute upper respiratory infection, unspecified: Secondary | ICD-10-CM | POA: Diagnosis not present

## 2016-01-25 DIAGNOSIS — Z79899 Other long term (current) drug therapy: Secondary | ICD-10-CM | POA: Diagnosis not present

## 2016-01-25 DIAGNOSIS — R0981 Nasal congestion: Secondary | ICD-10-CM | POA: Diagnosis present

## 2016-01-25 MED ORDER — OXYMETAZOLINE HCL 0.05 % NA SOLN
1.0000 | Freq: Once | NASAL | Status: AC
  Administered 2016-01-25: 1 via NASAL
  Filled 2016-01-25: qty 15

## 2016-01-25 MED ORDER — IBUPROFEN 800 MG PO TABS
800.0000 mg | ORAL_TABLET | Freq: Once | ORAL | Status: AC
  Administered 2016-01-25: 800 mg via ORAL
  Filled 2016-01-25: qty 1

## 2016-01-25 NOTE — ED Notes (Signed)
PT c/o nasal congestion and dry cough x3 days. PT denies any OTC medication use and no fever.

## 2016-01-25 NOTE — ED Provider Notes (Signed)
CSN: [id number]     Arrival date & time 01/25/16  1300 History  By signing my name below, I, Meriel Pica, attest that this documentation has been prepared under the direction and in the presence of Lily Kocher, PA-C. Electronically Signed: Meriel Pica, ED Scribe. 01/25/2016. 3:46 PM.   Chief Complaint  Patient presents with  . Nasal Congestion   Patient is a 40 y.o. male presenting with URI. The history is provided by the patient. No language interpreter was used.  URI Presenting symptoms: congestion and cough   Presenting symptoms: no fever and no sore throat   Congestion:    Location:  Nasal   Interferes with eating/drinking: no   Severity:  Mild Duration:  3 days Timing:  Constant Chronicity:  New Risk factors: no sick contacts    HPI Comments: Aaron Spence is a 40 y.o. male who presents to the Emergency Department complaining of a constant, moderate URI X 3 days. He reports a dry cough, hoarse voice, and nasal congestion. Pt denies vomiting, diarrhea, sore throat, rashes, fever or chills. No known sick contacts.   Past Medical History  Diagnosis Date  . Tobacco use   . Bronchitis    History reviewed. No pertinent past surgical history. Family History  Problem Relation Age of Onset  . Bronchiolitis     Social History  Substance Use Topics  . Smoking status: Current Every Day Smoker -- 1.50 packs/day for 20 years    Types: Cigarettes  . Smokeless tobacco: Never Used  . Alcohol Use: No    Review of Systems  Constitutional: Negative for fever.  HENT: Positive for congestion and voice change. Negative for sore throat.   Respiratory: Positive for cough.   All other systems reviewed and are negative.  Allergies  Review of patient's allergies indicates no known allergies.  Home Medications   Prior to Admission medications   Medication Sig Start Date End Date Taking? Authorizing Provider  HYDROcodone-acetaminophen (NORCO) 7.5-325 MG tablet Take 1 tablet  by mouth every 4 (four) hours as needed for moderate pain. 01/05/16  Yes Sanjuana Kava, MD  naproxen (NAPROSYN) 500 MG tablet Take 1 tablet (500 mg total) by mouth 2 (two) times daily. Patient not taking: Reported on 01/25/2016 10/16/14   Fredia Sorrow, MD   BP 105/65 mmHg  Pulse 104  Temp(Src) 98.2 F (36.8 C) (Oral)  Resp 18  Ht 5\' 5"  (1.651 m)  Wt 117 lb 5 oz (53.213 kg)  BMI 19.52 kg/m2  SpO2 99% Physical Exam  Constitutional: He is oriented to person, place, and time. He appears well-developed and well-nourished. No distress.  HENT:  Head: Normocephalic.  Mouth/Throat: Uvula is midline and oropharynx is clear and moist. Uvula swelling present.  Nasal congestion present; uvula moderately swollen but midline, airway patent.   Eyes: Conjunctivae are normal.  Neck: Normal range of motion. Neck supple.  Full ROM of neck, no cervical LAD.  Cardiovascular: Normal rate.   Pulmonary/Chest: Effort normal and breath sounds normal. No respiratory distress. He has no wheezes. He has no rales.  Lungs clear to auscultation bilaterally.   Abdominal: Soft. Bowel sounds are normal.  Musculoskeletal: Normal range of motion.  Lymphadenopathy:    He has no cervical adenopathy.  Neurological: He is alert and oriented to person, place, and time. Coordination normal.  Skin: Skin is warm.  Psychiatric: He has a normal mood and affect. His behavior is normal.  Nursing note and vitals reviewed.   ED Course  Procedures  DIAGNOSTIC STUDIES: Oxygen Saturation is 99% on RA, normal by my interpretation.    COORDINATION OF CARE: 3:31 PM Discussed treatment plan with pt at bedside which includes to treat symptoms and advised increased fluid intake and hand washing. Pt agreed to plan.   MDM  Vital signs reviewed. The history and the examination suggest upper respiratory infection. The patient is advised to increase fluids, use decongestant of his choice. He will use Tylenol or ibuprofen for soreness  or fever. He is to return to the emergency department if any changes, problems, or concerns.    Final diagnoses:  URI (upper respiratory infection)    **I have reviewed nursing notes, vital signs, and all appropriate lab and imaging results for this patient.*  **I personally performed the services described in this documentation, which was scribed in my presence. The recorded information has been reviewed and is accurate.Lily Kocher, PA-C 01/26/16 1655  Ripley Fraise, MD 01/27/16 [phone]

## 2016-01-25 NOTE — Discharge Instructions (Signed)
Your examination is consistent with upper respiratory infection. Please increase fluids. Please wash hands frequently. Use Tylenol every 4 hours, ibuprofen every 6 hours for fever or aching. Use Afrin spray 3 times daily for 5 days only. Upper Respiratory Infection, Adult Most upper respiratory infections (URIs) are a viral infection of the air passages leading to the lungs. A URI affects the nose, throat, and upper air passages. The most common type of URI is nasopharyngitis and is typically referred to as "the common cold." URIs run their course and usually go away on their own. Most of the time, a URI does not require medical attention, but sometimes a bacterial infection in the upper airways can follow a viral infection. This is called a secondary infection. Sinus and middle ear infections are common types of secondary upper respiratory infections. Bacterial pneumonia can also complicate a URI. A URI can worsen asthma and chronic obstructive pulmonary disease (COPD). Sometimes, these complications can require emergency medical care and may be life threatening.  CAUSES Almost all URIs are caused by viruses. A virus is a type of germ and can spread from one person to another.  RISKS FACTORS You may be at risk for a URI if:   You smoke.   You have chronic heart or lung disease.  You have a weakened defense (immune) system.   You are very young or very old.   You have nasal allergies or asthma.  You work in crowded or poorly ventilated areas.  You work in health care facilities or schools. SIGNS AND SYMPTOMS  Symptoms typically develop 2-3 days after you come in contact with a cold virus. Most viral URIs last 7-10 days. However, viral URIs from the influenza virus (flu virus) can last 14-18 days and are typically more severe. Symptoms may include:   Runny or stuffy (congested) nose.   Sneezing.   Cough.   Sore throat.   Headache.   Fatigue.   Fever.   Loss of  appetite.   Pain in your forehead, behind your eyes, and over your cheekbones (sinus pain).  Muscle aches.  DIAGNOSIS  Your health care provider may diagnose a URI by:  Physical exam.  Tests to check that your symptoms are not due to another condition such as:  Strep throat.  Sinusitis.  Pneumonia.  Asthma. TREATMENT  A URI goes away on its own with time. It cannot be cured with medicines, but medicines may be prescribed or recommended to relieve symptoms. Medicines may help:  Reduce your fever.  Reduce your cough.  Relieve nasal congestion. HOME CARE INSTRUCTIONS   Take medicines only as directed by your health care provider.   Gargle warm saltwater or take cough drops to comfort your throat as directed by your health care provider.  Use a warm mist humidifier or inhale steam from a shower to increase air moisture. This may make it easier to breathe.  Drink enough fluid to keep your urine clear or pale yellow.   Eat soups and other clear broths and maintain good nutrition.   Rest as needed.   Return to work when your temperature has returned to normal or as your health care provider advises. You may need to stay home longer to avoid infecting others. You can also use a face mask and careful hand washing to prevent spread of the virus.  Increase the usage of your inhaler if you have asthma.   Do not use any tobacco products, including cigarettes, chewing tobacco, or electronic cigarettes.  If you need help quitting, ask your health care provider. PREVENTION  The best way to protect yourself from getting a cold is to practice good hygiene.   Avoid oral or hand contact with people with cold symptoms.   Wash your hands often if contact occurs.  There is no clear evidence that vitamin C, vitamin E, echinacea, or exercise reduces the chance of developing a cold. However, it is always recommended to get plenty of rest, exercise, and practice good nutrition.    SEEK MEDICAL CARE IF:   You are getting worse rather than better.   Your symptoms are not controlled by medicine.   You have chills.  You have worsening shortness of breath.  You have brown or red mucus.  You have yellow or brown nasal discharge.  You have pain in your face, especially when you bend forward.  You have a fever.  You have swollen neck glands.  You have pain while swallowing.  You have white areas in the back of your throat. SEEK IMMEDIATE MEDICAL CARE IF:   You have severe or persistent:  Headache.  Ear pain.  Sinus pain.  Chest pain.  You have chronic lung disease and any of the following:  Wheezing.  Prolonged cough.  Coughing up blood.  A change in your usual mucus.  You have a stiff neck.  You have changes in your:  Vision.  Hearing.  Thinking.  Mood. MAKE SURE YOU:   Understand these instructions.  Will watch your condition.  Will get help right away if you are not doing well or get worse.   This information is not intended to replace advice given to you by your health care provider. Make sure you discuss any questions you have with your health care provider.   Document Released: 04/12/2001 Document Revised: 03/03/2015 Document Reviewed: 01/22/2014 Elsevier Interactive Patient Education [employer].

## 2016-01-26 ENCOUNTER — Encounter: Payer: Self-pay | Admitting: Orthopaedic Surgery

## 2016-01-26 ENCOUNTER — Ambulatory Visit (INDEPENDENT_AMBULATORY_CARE_PROVIDER_SITE_OTHER): Payer: Medicare Other | Admitting: Orthopaedic Surgery

## 2016-01-26 VITALS — BP 120/64 | HR 76 | Temp 97.7°F | Resp 16 | Ht 65.0 in | Wt 117.0 lb

## 2016-01-26 DIAGNOSIS — M25511 Pain in right shoulder: Secondary | ICD-10-CM

## 2016-01-26 MED ORDER — HYDROCODONE-ACETAMINOPHEN 5-325 MG PO TABS: 1.0000 | ORAL_TABLET | ORAL | Status: DC | PRN

## 2016-01-26 NOTE — Progress Notes (Signed)
Patient [id number] Aaron Spence, male DOB:1976-07-14, 40 y.o. [id number]  Chief Complaint  Patient presents with  . Follow-up    follow up right shoulder    HPI  Aaron Spence is a 40 y.o. male who has chronic right shoulder pain.  He had an injection last time that helped.  He has no paresthesias.  He is better today.  He has no numbness, no acute pain.   Shoulder Pain  The pain is present in the right shoulder. This is a chronic problem. The current episode started more than 1 year ago. The problem occurs intermittently. The problem has been gradually improving. The quality of the pain is described as dull. The pain is at a severity of 2/10. The pain is mild. The symptoms are aggravated by activity. He has tried NSAIDS, oral narcotics, rest, heat and cold for the symptoms. The treatment provided moderate relief.    Body mass index is 19.47 kg/(m^2).   Review of Systems  Constitutional:       Patient does not have Diabetes Mellitus. Patient has hypertension. Patient has COPD or shortness of breath. Patient does not have BMI > 35. Patient has current smoking history.  HENT: Negative for congestion.   Respiratory: Positive for cough and shortness of breath.   Cardiovascular: Negative for chest pain.  Endocrine: Positive for cold intolerance.  Musculoskeletal: Positive for arthralgias.  Allergic/Immunologic: Negative for environmental allergies.    Past Medical History  Diagnosis Date  . Tobacco use   . Bronchitis     No past surgical history on file.  Family History  Problem Relation Age of Onset  . Bronchiolitis      Social History Social History  Substance Use Topics  . Smoking status: Current Every Day Smoker -- 1.50 packs/day for 20 years    Types: Cigarettes  . Smokeless tobacco: Never Used  . Alcohol Use: No    No Known Allergies  Current Outpatient Prescriptions  Medication Sig Dispense Refill  . HYDROcodone-acetaminophen (NORCO/VICODIN) 5-325 MG tablet  Take 1 tablet by mouth every 4 (four) hours as needed for moderate pain (Must last 30 days.  Do not take and drive a car or use machinery.). 120 tablet 0   No current facility-administered medications for this visit.     Physical Exam  Blood pressure 120/64, pulse 76, temperature 97.7 F (36.5 C), resp. rate 16, height 5\' 5"  (1.651 m), weight 117 lb (53.071 kg).  Constitutional: overall normal hygiene, normal nutrition, well developed, normal grooming, normal body habitus. Assistive device:none  Musculoskeletal: gait and station Limp none, muscle tone and strength are normal, no tremors or atrophy is present.  .  Neurological: coordination overall normal.  Deep tendon reflex/nerve stretch intact.  Sensation normal.  Cranial nerves II-XII intact.   Skin:   normal overall no scars, lesions, ulcers or rashes. No psoriasis.  Psychiatric: Alert and oriented x 3.  Recent memory intact, remote memory unclear.  Normal mood and affect. Well groomed.  Good eye contact.  Cardiovascular: overall no swelling, no varicosities, no edema bilaterally, normal temperatures of the legs and arms, no clubbing, cyanosis and good capillary refill.  Lymphatic: palpation is normal.  Examination of right Upper Extremity is done.  Inspection:   Overall:  Elbow non-tender without crepitus or defects, forearm non-tender without crepitus or defects, wrist non-tender without crepitus or defects, hand non-tender.    Shoulder: with glenohumeral joint tenderness, without effusion.   Upper arm: without swelling and tenderness   Range of  motion:   Overall:  Full range of motion of the elbow, full range of motion of wrist and full range of motion in fingers.   Shoulder:  right  160 degrees forward flexion; 140 degrees abduction; 35 degrees internal rotation, 35 degrees external rotation, 20 degrees extension, 40 degrees adduction.   Stability:   Overall:  Shoulder, elbow and wrist stable   Strength and  Tone:   Overall full shoulder muscles strength, full upper arm strength and normal upper arm bulk and tone.   The patient has been educated about the nature of the problem(s) and counseled on treatment options.  The patient appeared to understand what I have discussed and is in agreement with it.  Encounter Diagnosis  Name Primary?  . Right shoulder pain Yes    PLAN Call if any problems.  Precautions discussed.  Continue current medications.   Return to clinic 1 month

## 2016-02-25 ENCOUNTER — Encounter: Payer: Self-pay | Admitting: Orthopaedic Surgery

## 2016-02-25 ENCOUNTER — Ambulatory Visit (INDEPENDENT_AMBULATORY_CARE_PROVIDER_SITE_OTHER): Payer: Medicare Other | Admitting: Orthopaedic Surgery

## 2016-02-25 VITALS — BP 113/64 | HR 84 | Temp 97.7°F | Ht 65.0 in | Wt 117.0 lb

## 2016-02-25 DIAGNOSIS — M25511 Pain in right shoulder: Secondary | ICD-10-CM

## 2016-02-25 MED ORDER — HYDROCODONE-ACETAMINOPHEN 7.5-325 MG PO TABS: 1.0000 | ORAL_TABLET | ORAL | Status: DC | PRN

## 2016-02-25 NOTE — Progress Notes (Signed)
Patient [name] [name], male DOB:1976-02-04, 40 y.o. [id number]  Chief Complaint  Patient presents with  . Follow-up    Right shoulder    HPI  TREVONTAE GLOSS is a 40 y.o. male who has chronic right shoulder pain.  He has no new trauma. He uses ice at times which helps.  He tries to do his exercises.  He has no redness, no paresthesias.  HPI  Body mass index is 19.47 kg/(m^2).   Review of Systems  Constitutional:       Patient does not have Diabetes Mellitus. Patient has hypertension. Patient has COPD or shortness of breath. Patient does not have BMI > 35. Patient has current smoking history.  HENT: Negative for congestion.   Respiratory: Positive for cough and shortness of breath.   Cardiovascular: Negative for chest pain.  Endocrine: Positive for cold intolerance.  Musculoskeletal: Positive for arthralgias.  Allergic/Immunologic: Negative for environmental allergies.    Past Medical History  Diagnosis Date  . Tobacco use   . Bronchitis     History reviewed. No pertinent past surgical history.  Family History  Problem Relation Age of Onset  . Bronchiolitis      Social History Social History  Substance Use Topics  . Smoking status: Current Every Day Smoker -- 1.50 packs/day for 20 years    Types: Cigarettes  . Smokeless tobacco: Never Used  . Alcohol Use: No    No Known Allergies  Current Outpatient Prescriptions  Medication Sig Dispense Refill  . HYDROcodone-acetaminophen (NORCO) 7.5-325 MG tablet Take 1 tablet by mouth every 4 (four) hours as needed for moderate pain (Must last 30 days.  Do not drive or operate machinery while taking this medicine.). 120 tablet 0   No current facility-administered medications for this visit.     Physical Exam  Blood pressure 113/64, pulse 84, temperature 97.7 F (36.5 C), height 5\' 5"  (1.651 m), weight 117 lb (53.071 kg).  Constitutional: overall normal hygiene, normal nutrition, well developed, normal  grooming, normal body habitus. Assistive device:none  Musculoskeletal: gait and station Limp none, muscle tone and strength are normal, no tremors or atrophy is present.  .  Neurological: coordination overall normal.  Deep tendon reflex/nerve stretch intact.  Sensation normal.  Cranial nerves II-XII intact.   Skin:   normal overall no scars, lesions, ulcers or rashes. No psoriasis.  Psychiatric: Alert and oriented x 3.  Recent memory intact, remote memory unclear.  Normal mood and affect. Well groomed.  Good eye contact.  Cardiovascular: overall no swelling, no varicosities, no edema bilaterally, normal temperatures of the legs and arms, no clubbing, cyanosis and good capillary refill.  Lymphatic: palpation is normal.  Left shoulder normal.  Examination of right Upper Extremity is done.  Inspection:   Overall:  Elbow non-tender without crepitus or defects, forearm non-tender without crepitus or defects, wrist non-tender without crepitus or defects, hand non-tender.    Shoulder: with glenohumeral joint tenderness, without effusion.   Upper arm: without swelling and tenderness   Range of motion:   Overall:  Full range of motion of the elbow, full range of motion of wrist and full range of motion in fingers.   Shoulder:  right  160 degrees forward flexion; 145 degrees abduction; 35 degrees internal rotation, 35 degrees external rotation, 20 degrees extension, 40 degrees adduction.   Stability:   Overall:  Shoulder, elbow and wrist stable   Strength and Tone:   Overall full shoulder muscles strength, full upper arm strength and normal  upper arm bulk and tone.  The patient has been educated about the nature of the problem(s) and counseled on treatment options.  The patient appeared to understand what I have discussed and is in agreement with it.  Encounter Diagnosis  Name Primary?  . Right shoulder pain Yes    PLAN Call if any problems.  Precautions discussed.  Continue current  medications.   Return to clinic 1 month

## 2016-03-10 DIAGNOSIS — F17200 Nicotine dependence, unspecified, uncomplicated: Secondary | ICD-10-CM | POA: Diagnosis not present

## 2016-03-10 DIAGNOSIS — M549 Dorsalgia, unspecified: Secondary | ICD-10-CM | POA: Diagnosis not present

## 2016-03-24 ENCOUNTER — Ambulatory Visit (INDEPENDENT_AMBULATORY_CARE_PROVIDER_SITE_OTHER): Payer: Medicare Other | Admitting: Orthopaedic Surgery

## 2016-03-24 ENCOUNTER — Encounter: Payer: Self-pay | Admitting: Orthopaedic Surgery

## 2016-03-24 VITALS — BP 110/68 | HR 86 | Temp 97.9°F | Ht 65.0 in | Wt 117.0 lb

## 2016-03-24 DIAGNOSIS — M25511 Pain in right shoulder: Secondary | ICD-10-CM | POA: Diagnosis not present

## 2016-03-24 MED ORDER — HYDROCODONE-ACETAMINOPHEN 7.5-325 MG PO TABS: 1.0000 | ORAL_TABLET | ORAL | Status: DC | PRN

## 2016-03-24 NOTE — Progress Notes (Signed)
Patient [name] Aaron Spence, male DOB:09-14-1976, 40 y.o. [id number]  Chief Complaint  Patient presents with  . Follow-up    Right shoulder pain    HPI  Aaron Spence is a 40 y.o. male who has chronic pain of the right shoulder.  He has no redness, no swelling, no paresthesias.  He has been doing his exercises and taking the medicine.  HPI  Body mass index is 19.47 kg/(m^2).  ROS  Review of Systems  Constitutional:       Patient does not have Diabetes Mellitus. Patient has hypertension. Patient has COPD or shortness of breath. Patient does not have BMI > 35. Patient has current smoking history.  HENT: Negative for congestion.   Respiratory: Positive for cough and shortness of breath.   Cardiovascular: Negative for chest pain.  Endocrine: Positive for cold intolerance.  Musculoskeletal: Positive for arthralgias.  Allergic/Immunologic: Negative for environmental allergies.    Past Medical History  Diagnosis Date  . Tobacco use   . Bronchitis     History reviewed. No pertinent past surgical history.  Family History  Problem Relation Age of Onset  . Bronchiolitis      Social History Social History  Substance Use Topics  . Smoking status: Current Every Day Smoker -- 1.50 packs/day for 20 years    Types: Cigarettes  . Smokeless tobacco: Never Used  . Alcohol Use: No    No Known Allergies  Current Outpatient Prescriptions  Medication Sig Dispense Refill  . HYDROcodone-acetaminophen (NORCO) 7.5-325 MG tablet Take 1 tablet by mouth every 4 (four) hours as needed for moderate pain (Must last 30 days.  Do not drive or operate machinery while taking this medicine.). 120 tablet 0   No current facility-administered medications for this visit.     Physical Exam  Blood pressure 110/68, pulse 86, temperature 97.9 F (36.6 C), height 5\' 5"  (1.651 m), weight 117 lb (53.071 kg).  Constitutional: overall normal hygiene, normal nutrition, well developed, normal  grooming, normal body habitus. Assistive device:none  Musculoskeletal: gait and station Limp none, muscle tone and strength are normal, no tremors or atrophy is present.  .  Neurological: coordination overall normal.  Deep tendon reflex/nerve stretch intact.  Sensation normal.  Cranial nerves II-XII intact.   Skin:   normal overall no scars, lesions, ulcers or rashes. No psoriasis.  Psychiatric: Alert and oriented x 3.  Recent memory intact, remote memory unclear.  Normal mood and affect. Well groomed.  Good eye contact.  Cardiovascular: overall no swelling, no varicosities, no edema bilaterally, normal temperatures of the legs and arms, no clubbing, cyanosis and good capillary refill.  Lymphatic: palpation is normal.  Examination of right Upper Extremity is done.  Inspection:   Overall:  Elbow non-tender without crepitus or defects, forearm non-tender without crepitus or defects, wrist non-tender without crepitus or defects, hand non-tender.    Shoulder: with glenohumeral joint tenderness, without effusion.   Upper arm: without swelling and tenderness   Range of motion:   Overall:  Full range of motion of the elbow, full range of motion of wrist and full range of motion in fingers.   Shoulder:  right  160 degrees forward flexion; 145 degrees abduction; 35 degrees internal rotation, 35 degrees external rotation, 20 degrees extension, 40 degrees adduction.   Stability:   Overall:  Shoulder, elbow and wrist stable   Strength and Tone:   Overall full shoulder muscles strength, full upper arm strength and normal upper arm bulk and tone.  Left  shoulder negative.  The patient has been educated about the nature of the problem(s) and counseled on treatment options.  The patient appeared to understand what I have discussed and is in agreement with it.  Encounter Diagnosis  Name Primary?  . Right shoulder pain Yes    PLAN Call if any problems.  Precautions discussed.  Continue current  medications.   Return to clinic 2 months

## 2016-03-25 ENCOUNTER — Encounter: Payer: Self-pay | Admitting: Orthopaedic Surgery

## 2016-04-21 ENCOUNTER — Telehealth: Payer: Self-pay | Admitting: Orthopaedic Surgery

## 2016-04-21 MED ORDER — HYDROCODONE-ACETAMINOPHEN 5-325 MG PO TABS: 1.0000 | ORAL_TABLET | ORAL | Status: DC | PRN

## 2016-04-21 NOTE — Telephone Encounter (Signed)
Patient called and requested a refill on Hydrocodone-Acetamnophen(Norco) 7.5-325 mgs. Qty 120  Sig: Take 1 tablet by mouth every 4 (four) hours as needed for moderate pain (Must last 30 days. Do not drive or operate machinery while taking this medicine.).

## 2016-04-21 NOTE — Telephone Encounter (Signed)
Rx done. 

## 2016-05-24 ENCOUNTER — Ambulatory Visit (INDEPENDENT_AMBULATORY_CARE_PROVIDER_SITE_OTHER): Payer: Medicare Other | Admitting: Orthopaedic Surgery

## 2016-05-24 ENCOUNTER — Encounter: Payer: Self-pay | Admitting: Orthopaedic Surgery

## 2016-05-24 VITALS — Ht 65.0 in | Wt 117.0 lb

## 2016-05-24 DIAGNOSIS — M25511 Pain in right shoulder: Secondary | ICD-10-CM

## 2016-05-24 MED ORDER — HYDROCODONE-ACETAMINOPHEN 5-325 MG PO TABS: 1.0000 | ORAL_TABLET | ORAL | 0 refills | Status: DC | PRN

## 2016-05-24 NOTE — Progress Notes (Signed)
Patient [id number] Aaron Spence, male DOB:1976-06-16, 40 y.o. [id number]  Chief Complaint  Patient presents with  . Follow-up    right shoulder pain    HPI  Aaron Spence is a 40 y.o. male who has chronic right shoulder pain.  He has no paresthesias and no trauma.  He is taking his medicine and doing his exercises. HPI  Body mass index is 19.47 kg/m.  ROS  Review of Systems  Constitutional:       Patient does not have Diabetes Mellitus. Patient has hypertension. Patient has COPD or shortness of breath. Patient does not have BMI > 35. Patient has current smoking history.  HENT: Negative for congestion.   Respiratory: Positive for cough and shortness of breath.   Cardiovascular: Negative for chest pain.  Endocrine: Positive for cold intolerance.  Musculoskeletal: Positive for arthralgias.  Allergic/Immunologic: Negative for environmental allergies.    Past Medical History:  Diagnosis Date  . Bronchitis   . Tobacco use     History reviewed. No pertinent surgical history.  Family History  Problem Relation Age of Onset  . Bronchiolitis      Social History Social History  Substance Use Topics  . Smoking status: Current Every Day Smoker    Packs/day: 1.50    Years: 20.00    Types: Cigarettes  . Smokeless tobacco: Never Used  . Alcohol use No    No Known Allergies  Current Outpatient Prescriptions  Medication Sig Dispense Refill  . HYDROcodone-acetaminophen (NORCO/VICODIN) 5-325 MG tablet Take 1 tablet by mouth every 4 (four) hours as needed for moderate pain (Must last 30 days.  Do not take and drive a car or use machinery.). 120 tablet 0   No current facility-administered medications for this visit.      Physical Exam  Height 5\' 5"  (1.651 m), weight 117 lb (53.1 kg).  Constitutional: overall normal hygiene, normal nutrition, well developed, normal grooming, normal body habitus. Assistive device:none  Musculoskeletal: gait and station Limp none,  muscle tone and strength are normal, no tremors or atrophy is present.  .  Neurological: coordination overall normal.  Deep tendon reflex/nerve stretch intact.  Sensation normal.  Cranial nerves II-XII intact.   Skin:   normal overall no scars, lesions, ulcers or rashes. No psoriasis.  Psychiatric: Alert and oriented x 3.  Recent memory intact, remote memory unclear.  Normal mood and affect. Well groomed.  Good eye contact.  Cardiovascular: overall no swelling, no varicosities, no edema bilaterally, normal temperatures of the legs and arms, no clubbing, cyanosis and good capillary refill.  Lymphatic: palpation is normal.  Examination of right Upper Extremity is done.  Inspection:   Overall:  Elbow non-tender without crepitus or defects, forearm non-tender without crepitus or defects, wrist non-tender without crepitus or defects, hand non-tender.    Shoulder: with glenohumeral joint tenderness, without effusion.   Upper arm: without swelling and tenderness   Range of motion:   Overall:  Full range of motion of the elbow, full range of motion of wrist and full range of motion in fingers.   Shoulder:  right  165 degrees forward flexion; 150 degrees abduction; 35 degrees internal rotation, 35 degrees external rotation, 15 degrees extension, 40 degrees adduction.   Stability:   Overall:  Shoulder, elbow and wrist stable   Strength and Tone:   Overall full shoulder muscles strength, full upper arm strength and normal upper arm bulk and tone.  The patient has been educated about the nature of the problem(s) and  counseled on treatment options.  The patient appeared to understand what I have discussed and is in agreement with it.  Encounter Diagnosis  Name Primary?  . Right shoulder pain Yes    PLAN Call if any problems.  Precautions discussed.  Continue current medications.   Return to clinic 3 months   Electronically [place], MD 7/25/20178:28 PM

## 2016-06-22 ENCOUNTER — Telehealth: Payer: Self-pay | Admitting: Orthopaedic Surgery

## 2016-06-22 MED ORDER — HYDROCODONE-ACETAMINOPHEN 5-325 MG PO TABS: 1.0000 | ORAL_TABLET | ORAL | 0 refills | Status: DC | PRN

## 2016-06-22 NOTE — Telephone Encounter (Signed)
Patient called and requested a refill on Norco 5-325 mgs.  Qty  120   Sig: Take 1 tablet by mouth every 4 (four) hours as needed for moderate pain (Must last 30 days. Do not take and drive a car or use machinery.).

## 2016-08-10 ENCOUNTER — Telehealth: Payer: Self-pay | Admitting: Orthopaedic Surgery

## 2016-08-10 MED ORDER — HYDROCODONE-ACETAMINOPHEN 5-325 MG PO TABS: 1.0000 | ORAL_TABLET | Freq: Four times a day (QID) | ORAL | 0 refills | Status: DC | PRN

## 2016-08-10 NOTE — Telephone Encounter (Signed)
Hydrocodone-Acetaminophen 5/325mg      56 Tablets

## 2016-08-23 ENCOUNTER — Encounter: Payer: Self-pay | Admitting: Orthopaedic Surgery

## 2016-08-23 ENCOUNTER — Ambulatory Visit (INDEPENDENT_AMBULATORY_CARE_PROVIDER_SITE_OTHER): Payer: Medicare Other | Admitting: Orthopaedic Surgery

## 2016-08-23 VITALS — BP 123/67 | HR 80 | Temp 97.3°F | Ht 65.0 in | Wt 121.0 lb

## 2016-08-23 DIAGNOSIS — F17210 Nicotine dependence, cigarettes, uncomplicated: Secondary | ICD-10-CM | POA: Diagnosis not present

## 2016-08-23 DIAGNOSIS — M25511 Pain in right shoulder: Secondary | ICD-10-CM | POA: Diagnosis not present

## 2016-08-23 DIAGNOSIS — G8929 Other chronic pain: Secondary | ICD-10-CM

## 2016-08-23 MED ORDER — HYDROCODONE-ACETAMINOPHEN 5-325 MG PO TABS: 1.0000 | ORAL_TABLET | Freq: Four times a day (QID) | ORAL | 0 refills | Status: DC | PRN

## 2016-08-23 NOTE — Progress Notes (Signed)
Patient Aaron Spence, male DOB:Feb 28, 1976, 40 y.o. [id number]  Chief Complaint  Patient presents with  . Follow-up    Right Shoulder pain    HPI  Aaron Spence is a 40 y.o. male who has chronic pain of the right shoulder.  He has no new injury.  He has been doing his exercises.  He has no redness or numbness. HPI  Body mass index is 20.14 kg/m.  ROS  Review of Systems  Constitutional:       Patient does not have Diabetes Mellitus. Patient has hypertension. Patient has COPD or shortness of breath. Patient does not have BMI > 35. Patient has current smoking history.  HENT: Negative for congestion.   Respiratory: Positive for cough and shortness of breath.   Cardiovascular: Negative for chest pain.  Endocrine: Positive for cold intolerance.  Musculoskeletal: Positive for arthralgias.  Allergic/Immunologic: Negative for environmental allergies.    Past Medical History:  Diagnosis Date  . Bronchitis   . Tobacco use     History reviewed. No pertinent surgical history.  Family History  Problem Relation Age of Onset  . Bronchiolitis      Social History Social History  Substance Use Topics  . Smoking status: Current Every Day Smoker    Packs/day: 1.50    Years: 20.00    Types: Cigarettes  . Smokeless tobacco: Never Used  . Alcohol use No    No Known Allergies  Current Outpatient Prescriptions  Medication Sig Dispense Refill  . HYDROcodone-acetaminophen (NORCO/VICODIN) 5-325 MG tablet Take 1 tablet by mouth every 6 (six) hours as needed for moderate pain (Must last 14 days.Do not take and drive a car or use machinery.). 40 tablet 0   No current facility-administered medications for this visit.      Physical Exam  Blood pressure 123/67, pulse 80, temperature 97.3 F (36.3 C), height 5\' 5"  (1.651 m), weight 121 lb (54.9 kg).  Constitutional: overall normal hygiene, normal nutrition, well developed, normal grooming, normal body  habitus. Assistive device:none  Musculoskeletal: gait and station Limp none, muscle tone and strength are normal, no tremors or atrophy is present.  .  Neurological: coordination overall normal.  Deep tendon reflex/nerve stretch intact.  Sensation normal.  Cranial nerves II-XII intact.   Skin:   Normal overall no scars, lesions, ulcers or rashes. No psoriasis.  Psychiatric: Alert and oriented x 3.  Recent memory intact, remote memory unclear.  Normal mood and affect. Well groomed.  Good eye contact.  Cardiovascular: overall no swelling, no varicosities, no edema bilaterally, normal temperatures of the legs and arms, no clubbing, cyanosis and good capillary refill.  Lymphatic: palpation is normal.  He smokes and is willing to cut back some but not quit.  Examination of right Upper Extremity is done.  Inspection:   Overall:  Elbow non-tender without crepitus or defects, forearm non-tender without crepitus or defects, wrist non-tender without crepitus or defects, hand non-tender.    Shoulder: with glenohumeral joint tenderness, without effusion.   Upper arm: without swelling and tenderness   Range of motion:   Overall:  Full range of motion of the elbow, full range of motion of wrist and full range of motion in fingers.   Shoulder:  right  165 degrees forward flexion; 145 degrees abduction; 35 degrees internal rotation, 35 degrees external rotation, 15 degrees extension, 40 degrees adduction.   Stability:   Overall:  Shoulder, elbow and wrist stable   Strength and Tone:   Overall full shoulder muscles  strength, full upper arm strength and normal upper arm bulk and tone.  The patient has been educated about the nature of the problem(s) and counseled on treatment options.  The patient appeared to understand what I have discussed and is in agreement with it.  Encounter Diagnoses  Name Primary?  . Chronic right shoulder pain   . Cigarette nicotine dependence without complication Yes     PLAN Call if any problems.  Precautions discussed.  Continue current medications.   Return to clinic 3 months   Electronically Signed Sanjuana Kava, MD 10/24/201710:39 AM

## 2016-08-23 NOTE — Patient Instructions (Signed)
Smoking Cessation, Tips for Success If you are ready to quit smoking, congratulations! You have chosen to help yourself be healthier. Cigarettes bring nicotine, tar, carbon monoxide, and other irritants into your body. Your lungs, heart, and blood vessels will be able to work better without these poisons. There are many different ways to quit smoking. Nicotine gum, nicotine patches, a nicotine inhaler, or nicotine nasal spray can help with physical craving. Hypnosis, support groups, and medicines help break the habit of smoking. WHAT THINGS CAN I DO TO MAKE QUITTING EASIER?  Here are some tips to help you quit for good:  Pick a date when you will quit smoking completely. Tell all of your friends and family about your plan to quit on that date.  Do not try to slowly cut down on the number of cigarettes you are smoking. Pick a quit date and quit smoking completely starting on that day.  Throw away all cigarettes.   Clean and remove all ashtrays from your home, work, and car.  On a card, write down your reasons for quitting. Carry the card with you and read it when you get the urge to smoke.  Cleanse your body of nicotine. Drink enough water and fluids to keep your urine clear or pale yellow. Do this after quitting to flush the nicotine from your body.  Learn to predict your moods. Do not let a bad situation be your excuse to have a cigarette. Some situations in your life might tempt you into wanting a cigarette.  Never have "just one" cigarette. It leads to wanting another and another. Remind yourself of your decision to quit.  Change habits associated with smoking. If you smoked while driving or when feeling stressed, try other activities to replace smoking. Stand up when drinking your coffee. Brush your teeth after eating. Sit in a different chair when you read the paper. Avoid alcohol while trying to quit, and try to drink fewer caffeinated beverages. Alcohol and caffeine may urge you to  smoke.  Avoid foods and drinks that can trigger a desire to smoke, such as sugary or spicy foods and alcohol.  Ask people who smoke not to smoke around you.  Have something planned to do right after eating or having a cup of coffee. For example, plan to take a walk or exercise.  Try a relaxation exercise to calm you down and decrease your stress. Remember, you may be tense and nervous for the first 2 weeks after you quit, but this will pass.  Find new activities to keep your hands busy. Play with a pen, coin, or rubber band. Doodle or draw things on paper.  Brush your teeth right after eating. This will help cut down on the craving for the taste of tobacco after meals. You can also try mouthwash.   Use oral substitutes in place of cigarettes. Try using lemon drops, carrots, cinnamon sticks, or chewing gum. Keep them handy so they are available when you have the urge to smoke.  When you have the urge to smoke, try deep breathing.  Designate your home as a nonsmoking area.  If you are a heavy smoker, ask your health care provider about a prescription for nicotine chewing gum. It can ease your withdrawal from nicotine.  Reward yourself. Set aside the cigarette money you save and buy yourself something nice.  Look for support from others. Join a support group or smoking cessation program. Ask someone at home or at work to help you with your plan   to quit smoking.  Always ask yourself, "Do I need this cigarette or is this just a reflex?" Tell yourself, "Today, I choose not to smoke," or "I do not want to smoke." You are reminding yourself of your decision to quit.  Do not replace cigarette smoking with electronic cigarettes (commonly called e-cigarettes). The safety of e-cigarettes is unknown, and some may contain harmful chemicals.  If you relapse, do not give up! Plan ahead and think about what you will do the next time you get the urge to smoke. HOW WILL I FEEL WHEN I QUIT SMOKING? You  may have symptoms of withdrawal because your body is used to nicotine (the addictive substance in cigarettes). You may crave cigarettes, be irritable, feel very hungry, cough often, get headaches, or have difficulty concentrating. The withdrawal symptoms are only temporary. They are strongest when you first quit but will go away within 10-14 days. When withdrawal symptoms occur, stay in control. Think about your reasons for quitting. Remind yourself that these are signs that your body is healing and getting used to being without cigarettes. Remember that withdrawal symptoms are easier to treat than the major diseases that smoking can cause.  Even after the withdrawal is over, expect periodic urges to smoke. However, these cravings are generally short lived and will go away whether you smoke or not. Do not smoke! WHAT RESOURCES ARE AVAILABLE TO HELP ME QUIT SMOKING? Your health care provider can direct you to community resources or hospitals for support, which may include:  Group support.  Education.  Hypnosis.  Therapy.   This information is not intended to replace advice given to you by your health care provider. Make sure you discuss any questions you have with your health care provider.   Document Released: 07/15/2004 Document Revised: 11/07/2014 Document Reviewed: 04/04/2013 Elsevier Interactive Patient Education 2016 [employer]  

## 2016-09-14 ENCOUNTER — Emergency Department (HOSPITAL_COMMUNITY)
Admission: EM | Admit: 2016-09-14 | Discharge: 2016-09-14 | Disposition: A | Discharge status: Home / Self Care | Payer: Medicare Other | Attending: Emergency Medicine | Admitting: Emergency Medicine

## 2016-09-14 ENCOUNTER — Encounter (HOSPITAL_COMMUNITY): Payer: Self-pay

## 2016-09-14 ENCOUNTER — Telehealth: Payer: Self-pay | Admitting: Orthopedic Surgery

## 2016-09-14 ENCOUNTER — Other Ambulatory Visit: Payer: Self-pay | Admitting: *Deleted

## 2016-09-14 ENCOUNTER — Emergency Department (HOSPITAL_COMMUNITY): Payer: Medicare Other

## 2016-09-14 DIAGNOSIS — J430 Unilateral pulmonary emphysema [MacLeod's syndrome]: Secondary | ICD-10-CM | POA: Insufficient documentation

## 2016-09-14 DIAGNOSIS — J439 Emphysema, unspecified: Secondary | ICD-10-CM | POA: Diagnosis not present

## 2016-09-14 DIAGNOSIS — Z79899 Other long term (current) drug therapy: Secondary | ICD-10-CM | POA: Insufficient documentation

## 2016-09-14 DIAGNOSIS — F17210 Nicotine dependence, cigarettes, uncomplicated: Secondary | ICD-10-CM | POA: Diagnosis not present

## 2016-09-14 DIAGNOSIS — M779 Enthesopathy, unspecified: Secondary | ICD-10-CM | POA: Insufficient documentation

## 2016-09-14 DIAGNOSIS — M778 Other enthesopathies, not elsewhere classified: Secondary | ICD-10-CM

## 2016-09-14 DIAGNOSIS — M25512 Pain in left shoulder: Secondary | ICD-10-CM | POA: Diagnosis not present

## 2016-09-14 DIAGNOSIS — M7580 Other shoulder lesions, unspecified shoulder: Secondary | ICD-10-CM

## 2016-09-14 DIAGNOSIS — M7590 Shoulder lesion, unspecified, unspecified shoulder: Secondary | ICD-10-CM | POA: Diagnosis not present

## 2016-09-14 MED ORDER — IOPAMIDOL (ISOVUE-300) INJECTION 61%
75.0000 mL | Freq: Once | INTRAVENOUS | Status: AC | PRN
  Administered 2016-09-14: 75 mL via INTRAVENOUS

## 2016-09-14 MED ORDER — DICLOFENAC SODIUM 75 MG PO TBEC: 75.0000 mg | DELAYED_RELEASE_TABLET | Freq: Two times a day (BID) | ORAL | 0 refills | Status: DC

## 2016-09-14 MED ORDER — IOPAMIDOL (ISOVUE-M 300) INJECTION 61%: 15.0000 mL | Freq: Once | INTRAMUSCULAR | Status: DC | PRN

## 2016-09-14 MED ORDER — HYDROCODONE-ACETAMINOPHEN 5-325 MG PO TABS: 1.0000 | ORAL_TABLET | Freq: Four times a day (QID) | ORAL | 0 refills | Status: DC | PRN

## 2016-09-14 MED ORDER — IBUPROFEN 400 MG PO TABS
400.0000 mg | ORAL_TABLET | Freq: Once | ORAL | Status: AC
  Administered 2016-09-14: 400 mg via ORAL
  Filled 2016-09-14: qty 1

## 2016-09-14 MED ORDER — HYDROCODONE-ACETAMINOPHEN 5-325 MG PO TABS
1.0000 | ORAL_TABLET | Freq: Once | ORAL | Status: AC
  Administered 2016-09-14: 1 via ORAL
  Filled 2016-09-14: qty 1

## 2016-09-14 NOTE — Telephone Encounter (Signed)
Hydrocodone-Acetaminophen  5/325mg  Qty 40 Tablets ° °Take 1 tablet by mouth every 6 (six) hours as needed for moderate pain (Must last 14 days. Do not take and drive a car or use machinery.). °

## 2016-09-14 NOTE — ED Triage Notes (Signed)
Pt reports pain in left shoulder that started while working last night.  Reports pain is worse with movement.  Denies any chest pain.

## 2016-09-14 NOTE — ED Provider Notes (Signed)
[facility] DEPT Provider Note   CSN: [id number] Arrival date & time: 09/14/16  1540     History   Chief Complaint No chief complaint on file.   HPI Aaron Spence is a 40 y.o. male.  Patient is a 40 year old male who presents to the emergency department with a complaint of left shoulder pain. The patient states that while he was working on last evening he began to have increasing pain involving the left shoulder. The pain is worse with movement. However wrist does not help a whole lot. He has had issues with his right shoulder in the past, and now is beginning to have problems with his left shoulder. He is not dropping objects, but states that when he has to lift, push, or portal that he has increasing pain.   The history is provided by the patient.    Past Medical History:  Diagnosis Date  . Bronchitis   . Tobacco use     Patient Active Problem List   Diagnosis Date Noted  . Right shoulder pain 12/08/2015  . Pain in joint, shoulder region 05/05/2014  . Muscle weakness (generalized) 05/05/2014    History reviewed. No pertinent surgical history.     Home Medications    Prior to Admission medications   Medication Sig Start Date End Date Taking? Authorizing Provider  HYDROcodone-acetaminophen (NORCO/VICODIN) 5-325 MG tablet Take 1 tablet by mouth every 6 (six) hours as needed for moderate pain (Must last 14 days.Do not take and drive a car or use machinery.). 09/14/16   Carole Civil, MD    Family History Family History  Problem Relation Age of Onset  . Bronchiolitis      Social History Social History  Substance Use Topics  . Smoking status: Current Every Day Smoker    Packs/day: 1.50    Years: 20.00    Types: Cigarettes  . Smokeless tobacco: Never Used  . Alcohol use No     Allergies   Patient has no known allergies.   Review of Systems Review of Systems  Musculoskeletal: Positive for arthralgias.  All other systems reviewed and are  negative.    Physical Exam Updated Vital Signs BP 125/67 (BP Location: Right Arm)   Pulse 97   Temp 98.2 F (36.8 C) (Oral)   Resp 18   Ht 5\' 6"  (1.676 m)   Wt 59 kg   SpO2 99%   BMI 20.98 kg/m   Physical Exam  Constitutional: He is oriented to person, place, and time. He appears well-developed and well-nourished.  Non-toxic appearance.  HENT:  Head: Normocephalic.  Right Ear: Tympanic membrane and external ear normal.  Left Ear: Tympanic membrane and external ear normal.  Eyes: EOM and lids are normal. Pupils are equal, round, and reactive to light.  Neck: Normal range of motion. Neck supple. Carotid bruit is not present.  Cardiovascular: Normal rate, regular rhythm, normal heart sounds, intact distal pulses and normal pulses.   Pulmonary/Chest: Breath sounds normal. No respiratory distress.  Abdominal: Soft. Bowel sounds are normal. There is no tenderness. There is no guarding.  Musculoskeletal:       Left shoulder: He exhibits decreased range of motion, tenderness, crepitus and pain. He exhibits no deformity.  Lymphadenopathy:       Head (right side): No submandibular adenopathy present.       Head (left side): No submandibular adenopathy present.    He has no cervical adenopathy.  Neurological: He is alert and oriented to person, place, and  time. He has normal strength. No cranial nerve deficit or sensory deficit.  Skin: Skin is warm and dry.  Psychiatric: He has a normal mood and affect. His speech is normal.  Nursing note and vitals reviewed.    ED Treatments / Results  Labs (all labs ordered are listed, but only abnormal results are displayed) Labs Reviewed - No data to display  EKG  EKG Interpretation None       Radiology No results found.  Procedures Procedures (including critical care time)  Medications Ordered in ED Medications - No data to display   Initial Impression / Assessment and Plan / ED Course  I have reviewed the triage vital signs  and the nursing notes.  Pertinent labs & imaging results that were available during my care of the patient were reviewed by me and considered in my medical decision making (see chart for details).  Clinical Course     **I have reviewed nursing notes, vital signs, and all appropriate lab and imaging results for this patient.*  Final Clinical Impressions(s) / ED Diagnoses  X-ray of the left shoulder is negative for fracture or dislocation. The before meals joint is intact. There is however an irregularity and lucency of the left lateral lung. There is an irregularity of the left rib. Additional imaging has been suggested.  CT scan of the chest with contrast shows no abnormality of the left hemithorax. No focal rib abnormality.  Patient referred to orthopedics for additional evaluation and management concerning his shoulder. Patient placed on diclofenac. Patient will use heat to the area.    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

## 2016-09-14 NOTE — Discharge Instructions (Signed)
Your examination suggest tendinitis involving your left shoulder. The CT scan of your chest suggest emphysema. Please discuss this with Dr. Legrand Rams. Please see Dr. Luna Glasgow concerning your shoulder. Please use diclofenac 2 times daily with food.

## 2016-09-27 DIAGNOSIS — F17200 Nicotine dependence, unspecified, uncomplicated: Secondary | ICD-10-CM | POA: Diagnosis not present

## 2016-09-27 DIAGNOSIS — M549 Dorsalgia, unspecified: Secondary | ICD-10-CM | POA: Diagnosis not present

## 2016-10-18 ENCOUNTER — Telehealth: Payer: Self-pay | Admitting: Orthopaedic Surgery

## 2016-10-18 MED ORDER — HYDROCODONE-ACETAMINOPHEN 5-325 MG PO TABS: 1.0000 | ORAL_TABLET | Freq: Four times a day (QID) | ORAL | 0 refills | Status: DC | PRN

## 2016-10-18 NOTE — Telephone Encounter (Signed)
Patient called for refill:  HYDROcodone-acetaminophen (NORCO/VICODIN) 5-325 MG tablet 40 tablet

## 2016-11-22 ENCOUNTER — Telehealth: Payer: Self-pay | Admitting: Orthopaedic Surgery

## 2016-11-22 ENCOUNTER — Ambulatory Visit: Payer: Medicare Other | Admitting: Orthopaedic Surgery

## 2016-11-22 MED ORDER — HYDROCODONE-ACETAMINOPHEN 5-325 MG PO TABS: 1.0000 | ORAL_TABLET | Freq: Four times a day (QID) | ORAL | 0 refills | Status: DC | PRN

## 2016-11-22 NOTE — Telephone Encounter (Signed)
Hydrocodone-Acetaminophen 5/325mg  Qty 35 Tablets °

## 2016-11-24 ENCOUNTER — Ambulatory Visit (INDEPENDENT_AMBULATORY_CARE_PROVIDER_SITE_OTHER): Payer: Medicare Other | Admitting: Orthopaedic Surgery

## 2016-11-24 ENCOUNTER — Encounter: Payer: Self-pay | Admitting: Orthopaedic Surgery

## 2016-11-24 VITALS — BP 108/64 | HR 91 | Temp 97.9°F | Ht 65.0 in | Wt 125.0 lb

## 2016-11-24 DIAGNOSIS — G8929 Other chronic pain: Secondary | ICD-10-CM | POA: Diagnosis not present

## 2016-11-24 DIAGNOSIS — M25511 Pain in right shoulder: Secondary | ICD-10-CM | POA: Diagnosis not present

## 2016-11-24 DIAGNOSIS — F17210 Nicotine dependence, cigarettes, uncomplicated: Secondary | ICD-10-CM

## 2016-11-24 NOTE — Patient Instructions (Signed)
Steps to Quit Smoking Smoking tobacco can be bad for your health. It can also affect almost every organ in your body. Smoking puts you and people around you at risk for many serious long-lasting (chronic) diseases. Quitting smoking is hard, but it is one of the best things that you can do for your health. It is never too late to quit. What are the benefits of quitting smoking? When you quit smoking, you lower your risk for getting serious diseases and conditions. They can include:  Lung cancer or lung disease.  Heart disease.  Stroke.  Heart attack.  Not being able to have children (infertility).  Weak bones (osteoporosis) and broken bones (fractures). If you have coughing, wheezing, and shortness of breath, those symptoms may get better when you quit. You may also get sick less often. If you are pregnant, quitting smoking can help to lower your chances of having a baby of low birth weight. What can I do to help me quit smoking? Talk with your doctor about what can help you quit smoking. Some things you can do (strategies) include:  Quitting smoking totally, instead of slowly cutting back how much you smoke over a period of time.  Going to in-person counseling. You are more likely to quit if you go to many counseling sessions.  Using resources and support systems, such as:  Online chats with a counselor.  Phone quitlines.  Printed self-help materials.  Support groups or group counseling.  Text messaging programs.  Mobile phone apps or applications.  Taking medicines. Some of these medicines may have nicotine in them. If you are pregnant or breastfeeding, do not take any medicines to quit smoking unless your doctor says it is okay. Talk with your doctor about counseling or other things that can help you. Talk with your doctor about using more than one strategy at the same time, such as taking medicines while you are also going to in-person counseling. This can help make quitting  easier. What things can I do to make it easier to quit? Quitting smoking might feel very hard at first, but there is a lot that you can do to make it easier. Take these steps:  Talk to your family and friends. Ask them to support and encourage you.  Call phone quitlines, reach out to support groups, or work with a counselor.  Ask people who smoke to not smoke around you.  Avoid places that make you want (trigger) to smoke, such as:  Bars.  Parties.  Smoke-break areas at work.  Spend time with people who do not smoke.  Lower the stress in your life. Stress can make you want to smoke. Try these things to help your stress:  Getting regular exercise.  Deep-breathing exercises.  Yoga.  Meditating.  Doing a body scan. To do this, close your eyes, focus on one area of your body at a time from head to toe, and notice which parts of your body are tense. Try to relax the muscles in those areas.  Download or buy apps on your mobile phone or tablet that can help you stick to your quit plan. There are many free apps, such as QuitGuide from the CDC (Centers for Disease Control and Prevention). You can find more support from smokefree.gov and other websites. This information is not intended to replace advice given to you by your health care provider. Make sure you discuss any questions you have with your health care provider. Document Released: 08/13/2009 Document Revised: 06/14/2016 Document   Reviewed: 03/03/2015 Elsevier Interactive Patient Education  2017 Elsevier Inc.  

## 2016-11-24 NOTE — Progress Notes (Signed)
Patient Aaron Spence, male DOB:30-Oct-1976, 41 y.o. [id number]  Chief Complaint  Patient presents with  . Follow-up    Right shoulder pain    HPI  Aaron Spence is a 41 y.o. male who has chronic pain of the right shoulder.  He has no paresthesias or new trauma.  He has no redness.  He has pain with overhead use and some crepitus at times.   HPI  Body mass index is 20.8 kg/m.  ROS  Review of Systems  Constitutional:       Patient does not have Diabetes Mellitus. Patient has hypertension. Patient has COPD or shortness of breath. Patient does not have BMI > 35. Patient has current smoking history.  HENT: Negative for congestion.   Respiratory: Positive for cough and shortness of breath.   Cardiovascular: Negative for chest pain.  Endocrine: Positive for cold intolerance.  Musculoskeletal: Positive for arthralgias.  Allergic/Immunologic: Negative for environmental allergies.    Past Medical History:  Diagnosis Date  . Bronchitis   . Tobacco use     No past surgical history on file.  Family History  Problem Relation Age of Onset  . Bronchiolitis      Social History Social History  Substance Use Topics  . Smoking status: Current Every Day Smoker    Packs/day: 1.50    Years: 20.00    Types: Cigarettes  . Smokeless tobacco: Never Used  . Alcohol use No    No Known Allergies  Current Outpatient Prescriptions  Medication Sig Dispense Refill  . diclofenac (VOLTAREN) 75 MG EC tablet Take 1 tablet (75 mg total) by mouth 2 (two) times daily. 14 tablet 0  . HYDROcodone-acetaminophen (NORCO/VICODIN) 5-325 MG tablet Take 1 tablet by mouth every 6 (six) hours as needed for moderate pain (Must last 14 days.Do not take and drive a car or use machinery.). 25 tablet 0   No current facility-administered medications for this visit.      Physical Exam  Blood pressure 108/64, pulse 91, temperature 97.9 F (36.6 C), height 5\' 5"  (1.651 m), weight 125 lb (56.7  kg).  Constitutional: overall normal hygiene, normal nutrition, well developed, normal grooming, normal body habitus. Assistive device:none  Musculoskeletal: gait and station Limp none, muscle tone and strength are normal, no tremors or atrophy is present.  .  Neurological: coordination overall normal.  Deep tendon reflex/nerve stretch intact.  Sensation normal.  Cranial nerves II-XII intact.   Skin:   Normal overall no scars, lesions, ulcers or rashes. No psoriasis.  Psychiatric: Alert and oriented x 3.  Recent memory intact, remote memory unclear.  Normal mood and affect. Well groomed.  Good eye contact.  Cardiovascular: overall no swelling, no varicosities, no edema bilaterally, normal temperatures of the legs and arms, no clubbing, cyanosis and good capillary refill.  Lymphatic: palpation is normal.  Examination of right Upper Extremity is done.  Inspection:   Overall:  Elbow non-tender without crepitus or defects, forearm non-tender without crepitus or defects, wrist non-tender without crepitus or defects, hand non-tender.    Shoulder: with glenohumeral joint tenderness, without effusion.   Upper arm: without swelling and tenderness   Range of motion:   Overall:  Full range of motion of the elbow, full range of motion of wrist and full range of motion in fingers.   Shoulder:  right  165 degrees forward flexion; 140 degrees abduction; 35 degrees internal rotation, 35 degrees external rotation, 15 degrees extension, 40 degrees adduction.   Stability:   Overall:  Shoulder, elbow and wrist stable   Strength and Tone:   Overall full shoulder muscles strength, full upper arm strength and normal upper arm bulk and tone.   The patient has been educated about the nature of the problem(s) and counseled on treatment options.  The patient appeared to understand what I have discussed and is in agreement with it.  Encounter Diagnoses  Name Primary?  . Chronic right shoulder pain Yes  .  Cigarette nicotine dependence without complication     PLAN Call if any problems.  Precautions discussed.  Continue current medications.   Return to clinic 3 months   Electronically Signed Sanjuana Kava, MD 1/25/201810:04 AM

## 2016-12-20 ENCOUNTER — Telehealth: Payer: Self-pay | Admitting: Orthopaedic Surgery

## 2016-12-20 NOTE — Telephone Encounter (Signed)
Patient requests refill on Hydrocodone/Acetaminophen 5-325  Mgs.   Qty  25  Sig: Take 1 tablet by mouth every 6 (six) hours as needed for moderate pain (Must last 14 days.Do not take and drive a car or use machinery.).

## 2016-12-20 NOTE — Telephone Encounter (Signed)
Relayed to patient per Dr Keeling's response. Patient voiced understanding. °

## 2016-12-20 NOTE — Telephone Encounter (Signed)
Stop pain medicine at this point.

## 2017-02-22 ENCOUNTER — Ambulatory Visit (INDEPENDENT_AMBULATORY_CARE_PROVIDER_SITE_OTHER): Payer: Medicare Other | Admitting: Orthopaedic Surgery

## 2017-02-22 ENCOUNTER — Encounter: Payer: Self-pay | Admitting: Orthopaedic Surgery

## 2017-02-22 VITALS — BP 108/74 | HR 84 | Temp 98.8°F | Ht 65.0 in | Wt 133.0 lb

## 2017-02-22 DIAGNOSIS — G8929 Other chronic pain: Secondary | ICD-10-CM

## 2017-02-22 DIAGNOSIS — M25511 Pain in right shoulder: Secondary | ICD-10-CM

## 2017-02-22 DIAGNOSIS — F17210 Nicotine dependence, cigarettes, uncomplicated: Secondary | ICD-10-CM | POA: Diagnosis not present

## 2017-02-22 MED ORDER — HYDROCODONE-ACETAMINOPHEN 5-325 MG PO TABS: 1.0000 | ORAL_TABLET | Freq: Four times a day (QID) | ORAL | 0 refills | Status: DC | PRN

## 2017-02-22 NOTE — Progress Notes (Signed)
Patient Aaron Spence, male DOB:24-Aug-1976, 41 y.o. [id number]  Chief Complaint  Patient presents with  . Follow-up    RIGHT SHOULDER PAIN    HPI  Aaron Spence is a 41 y.o. male who has chronic right shoulder pain.  He has had more pain with cooler rainy weather we are having.  He has no paresthesias or new trauma.  He is doing his exercises. HPI  Body mass index is 22.13 kg/m.  ROS  Review of Systems  Constitutional:       Patient does not have Diabetes Mellitus. Patient has hypertension. Patient has COPD or shortness of breath. Patient does not have BMI > 35. Patient has current smoking history.  HENT: Negative for congestion.   Respiratory: Positive for cough and shortness of breath.   Cardiovascular: Negative for chest pain.  Endocrine: Positive for cold intolerance.  Musculoskeletal: Positive for arthralgias.  Allergic/Immunologic: Negative for environmental allergies.    Past Medical History:  Diagnosis Date  . Bronchitis   . Tobacco use     No past surgical history on file.  Family History  Problem Relation Age of Onset  . Bronchiolitis      Social History Social History  Substance Use Topics  . Smoking status: Current Every Day Smoker    Packs/day: 1.50    Years: 20.00    Types: Cigarettes  . Smokeless tobacco: Never Used  . Alcohol use No    No Known Allergies  Current Outpatient Prescriptions  Medication Sig Dispense Refill  . diclofenac (VOLTAREN) 75 MG EC tablet Take 1 tablet (75 mg total) by mouth 2 (two) times daily. 14 tablet 0  . HYDROcodone-acetaminophen (NORCO/VICODIN) 5-325 MG tablet Take 1 tablet by mouth every 6 (six) hours as needed for moderate pain (Must last 14 days.Do not take and drive a car or use machinery.). 25 tablet 0   No current facility-administered medications for this visit.      Physical Exam  Blood pressure 108/74, pulse 84, temperature 98.8 F (37.1 C), height 5\' 5"  (1.651 m), weight 133 lb  (60.3 kg).  Constitutional: overall normal hygiene, normal nutrition, well developed, normal grooming, normal body habitus. Assistive device:none  Musculoskeletal: gait and station Limp none, muscle tone and strength are normal, no tremors or atrophy is present.  .  Neurological: coordination overall normal.  Deep tendon reflex/nerve stretch intact.  Sensation normal.  Cranial nerves II-XII intact.   Skin:   Normal overall no scars, lesions, ulcers or rashes. No psoriasis.  Psychiatric: Alert and oriented x 3.  Recent memory intact, remote memory unclear.  Normal mood and affect. Well groomed.  Good eye contact.  Cardiovascular: overall no swelling, no varicosities, no edema bilaterally, normal temperatures of the legs and arms, no clubbing, cyanosis and good capillary refill.  Lymphatic: palpation is normal.  Examination of right Upper Extremity is done.  Inspection:   Overall:  Elbow non-tender without crepitus or defects, forearm non-tender without crepitus or defects, wrist non-tender without crepitus or defects, hand non-tender.    Shoulder: with glenohumeral joint tenderness, with effusion.   Upper arm: without swelling and tenderness   Range of motion:   Overall:  Full range of motion of the elbow, full range of motion of wrist and full range of motion in fingers.   Shoulder:  right  145 degrees forward flexion; 120 degrees abduction; 35 degrees internal rotation, 35 degrees external rotation, 15 degrees extension, 40 degrees adduction.   Stability:   Overall:  Shoulder,  elbow and wrist stable   Strength and Tone:   Overall full shoulder muscles strength, full upper arm strength and normal upper arm bulk and tone.   The patient has been educated about the nature of the problem(s) and counseled on treatment options.  The patient appeared to understand what I have discussed and is in agreement with it.  Encounter Diagnoses  Name Primary?  . Chronic right shoulder pain Yes   . Cigarette nicotine dependence without complication     PLAN Call if any problems.  Precautions discussed.  Continue current medications.   Return to clinic 3 months   I have reviewed the [place] web site prior to prescribing narcotic medicine for this patient.  Electronically Signed Sanjuana Kava, MD 4/25/20189:30 AM

## 2017-02-22 NOTE — Patient Instructions (Signed)
Steps to Quit Smoking Smoking tobacco can be bad for your health. It can also affect almost every organ in your body. Smoking puts you and people around you at risk for many serious long-lasting (chronic) diseases. Quitting smoking is hard, but it is one of the best things that you can do for your health. It is never too late to quit. What are the benefits of quitting smoking? When you quit smoking, you lower your risk for getting serious diseases and conditions. They can include:  Lung cancer or lung disease.  Heart disease.  Stroke.  Heart attack.  Not being able to have children (infertility).  Weak bones (osteoporosis) and broken bones (fractures). If you have coughing, wheezing, and shortness of breath, those symptoms may get better when you quit. You may also get sick less often. If you are pregnant, quitting smoking can help to lower your chances of having a baby of low birth weight. What can I do to help me quit smoking? Talk with your doctor about what can help you quit smoking. Some things you can do (strategies) include:  Quitting smoking totally, instead of slowly cutting back how much you smoke over a period of time.  Going to in-person counseling. You are more likely to quit if you go to many counseling sessions.  Using resources and support systems, such as:  Online chats with a counselor.  Phone quitlines.  Printed self-help materials.  Support groups or group counseling.  Text messaging programs.  Mobile phone apps or applications.  Taking medicines. Some of these medicines may have nicotine in them. If you are pregnant or breastfeeding, do not take any medicines to quit smoking unless your doctor says it is okay. Talk with your doctor about counseling or other things that can help you. Talk with your doctor about using more than one strategy at the same time, such as taking medicines while you are also going to in-person counseling. This can help make quitting  easier. What things can I do to make it easier to quit? Quitting smoking might feel very hard at first, but there is a lot that you can do to make it easier. Take these steps:  Talk to your family and friends. Ask them to support and encourage you.  Call phone quitlines, reach out to support groups, or work with a counselor.  Ask people who smoke to not smoke around you.  Avoid places that make you want (trigger) to smoke, such as:  Bars.  Parties.  Smoke-break areas at work.  Spend time with people who do not smoke.  Lower the stress in your life. Stress can make you want to smoke. Try these things to help your stress:  Getting regular exercise.  Deep-breathing exercises.  Yoga.  Meditating.  Doing a body scan. To do this, close your eyes, focus on one area of your body at a time from head to toe, and notice which parts of your body are tense. Try to relax the muscles in those areas.  Download or buy apps on your mobile phone or tablet that can help you stick to your quit plan. There are many free apps, such as QuitGuide from the CDC (Centers for Disease Control and Prevention). You can find more support from smokefree.gov and other websites. This information is not intended to replace advice given to you by your health care provider. Make sure you discuss any questions you have with your health care provider. Document Released: 08/13/2009 Document Revised: 06/14/2016 Document   Reviewed: 03/03/2015 Elsevier Interactive Patient Education  2017 Elsevier Inc.  

## 2017-03-21 ENCOUNTER — Telehealth: Payer: Self-pay | Admitting: Orthopaedic Surgery

## 2017-03-22 MED ORDER — HYDROCODONE-ACETAMINOPHEN 5-325 MG PO TABS
1.0000 | ORAL_TABLET | Freq: Four times a day (QID) | ORAL | 0 refills | Status: DC | PRN
Start: 2017-03-22 — End: 2017-05-24

## 2017-04-04 DIAGNOSIS — H6123 Impacted cerumen, bilateral: Secondary | ICD-10-CM | POA: Diagnosis not present

## 2017-04-24 ENCOUNTER — Telehealth: Payer: Self-pay

## 2017-04-24 NOTE — Telephone Encounter (Signed)
Request refill for Hydrocodone

## 2017-04-24 NOTE — Telephone Encounter (Signed)
He can take Advil, Aleve or Tylenol.  No more narcotics.

## 2017-05-14 ENCOUNTER — Emergency Department (HOSPITAL_COMMUNITY)
Admission: EM | Admit: 2017-05-14 | Discharge: 2017-05-15 | Disposition: A | Discharge status: Home / Self Care | Payer: Medicare Other | Attending: Emergency Medicine | Admitting: Emergency Medicine

## 2017-05-14 ENCOUNTER — Emergency Department (HOSPITAL_COMMUNITY): Payer: Medicare Other

## 2017-05-14 ENCOUNTER — Encounter (HOSPITAL_COMMUNITY): Payer: Self-pay | Admitting: *Deleted

## 2017-05-14 DIAGNOSIS — Y998 Other external cause status: Secondary | ICD-10-CM | POA: Diagnosis not present

## 2017-05-14 DIAGNOSIS — S3992XA Unspecified injury of lower back, initial encounter: Secondary | ICD-10-CM | POA: Diagnosis not present

## 2017-05-14 DIAGNOSIS — M545 Low back pain: Secondary | ICD-10-CM | POA: Diagnosis not present

## 2017-05-14 DIAGNOSIS — S161XXA Strain of muscle, fascia and tendon at neck level, initial encounter: Secondary | ICD-10-CM | POA: Diagnosis not present

## 2017-05-14 DIAGNOSIS — M542 Cervicalgia: Secondary | ICD-10-CM | POA: Diagnosis not present

## 2017-05-14 DIAGNOSIS — Y929 Unspecified place or not applicable: Secondary | ICD-10-CM | POA: Diagnosis not present

## 2017-05-14 DIAGNOSIS — W2210XA Striking against or struck by unspecified automobile airbag, initial encounter: Secondary | ICD-10-CM | POA: Diagnosis not present

## 2017-05-14 DIAGNOSIS — S39012A Strain of muscle, fascia and tendon of lower back, initial encounter: Secondary | ICD-10-CM | POA: Diagnosis not present

## 2017-05-14 DIAGNOSIS — S199XXA Unspecified injury of neck, initial encounter: Secondary | ICD-10-CM | POA: Diagnosis not present

## 2017-05-14 DIAGNOSIS — F17210 Nicotine dependence, cigarettes, uncomplicated: Secondary | ICD-10-CM | POA: Diagnosis not present

## 2017-05-14 DIAGNOSIS — Y9389 Activity, other specified: Secondary | ICD-10-CM | POA: Insufficient documentation

## 2017-05-14 DIAGNOSIS — M25511 Pain in right shoulder: Secondary | ICD-10-CM | POA: Diagnosis not present

## 2017-05-14 MED ORDER — IBUPROFEN 800 MG PO TABS
800.0000 mg | ORAL_TABLET | Freq: Once | ORAL | Status: AC
  Administered 2017-05-14: 800 mg via ORAL
  Filled 2017-05-14: qty 1

## 2017-05-14 MED ORDER — CYCLOBENZAPRINE HCL 10 MG PO TABS
10.0000 mg | ORAL_TABLET | Freq: Once | ORAL | Status: AC
  Administered 2017-05-14: 10 mg via ORAL
  Filled 2017-05-14: qty 1

## 2017-05-14 MED ORDER — CYCLOBENZAPRINE HCL 10 MG PO TABS
10.0000 mg | ORAL_TABLET | Freq: Three times a day (TID) | ORAL | 0 refills | Status: DC
Start: 2017-05-14 — End: 2017-08-24

## 2017-05-14 MED ORDER — IBUPROFEN 600 MG PO TABS: 600.0000 mg | ORAL_TABLET | Freq: Four times a day (QID) | ORAL | 0 refills | Status: DC

## 2017-05-14 NOTE — ED Notes (Signed)
From radiology 

## 2017-05-14 NOTE — ED Notes (Signed)
Awaiting dispo
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
## 2017-05-14 NOTE — ED Notes (Signed)
Pt in rad 
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
## 2017-05-14 NOTE — Discharge Instructions (Signed)
Your vital signs within normal limits. Your x-rays are negative for acute fracture or dislocation. You can expect to be sore over the next several days. Please use ibuprofen with breakfast, lunch, dinner, and at bedtime. Please use Flexeril 3 times daily if needed for spasm type pain. This medication may cause drowsiness. Please do not operate a vehicle, drink alcohol, operate machinery, or participate in activities requiring concentration when taking this medication. Please see Dr. Legrand Rams in the office for additional follow-up and evaluation.

## 2017-05-24 ENCOUNTER — Encounter: Payer: Self-pay | Admitting: Orthopaedic Surgery

## 2017-05-24 ENCOUNTER — Ambulatory Visit (INDEPENDENT_AMBULATORY_CARE_PROVIDER_SITE_OTHER): Payer: Medicare Other | Admitting: Orthopaedic Surgery

## 2017-05-24 VITALS — BP 115/75 | HR 77 | Temp 97.3°F | Ht 65.0 in | Wt 128.0 lb

## 2017-05-24 DIAGNOSIS — M545 Low back pain, unspecified: Secondary | ICD-10-CM

## 2017-05-24 DIAGNOSIS — M25511 Pain in right shoulder: Secondary | ICD-10-CM | POA: Diagnosis not present

## 2017-05-24 DIAGNOSIS — F17210 Nicotine dependence, cigarettes, uncomplicated: Secondary | ICD-10-CM | POA: Diagnosis not present

## 2017-05-24 DIAGNOSIS — G8929 Other chronic pain: Secondary | ICD-10-CM

## 2017-05-24 MED ORDER — HYDROCODONE-ACETAMINOPHEN 5-325 MG PO TABS: 1.0000 | ORAL_TABLET | Freq: Four times a day (QID) | ORAL | 0 refills | Status: DC | PRN

## 2017-05-24 NOTE — Patient Instructions (Signed)
Steps to Quit Smoking Smoking tobacco can be bad for your health. It can also affect almost every organ in your body. Smoking puts you and people around you at risk for many serious long-lasting (chronic) diseases. Quitting smoking is hard, but it is one of the best things that you can do for your health. It is never too late to quit. What are the benefits of quitting smoking? When you quit smoking, you lower your risk for getting serious diseases and conditions. They can include:  Lung cancer or lung disease.  Heart disease.  Stroke.  Heart attack.  Not being able to have children (infertility).  Weak bones (osteoporosis) and broken bones (fractures).  If you have coughing, wheezing, and shortness of breath, those symptoms may get better when you quit. You may also get sick less often. If you are pregnant, quitting smoking can help to lower your chances of having a baby of low birth weight. What can I do to help me quit smoking? Talk with your doctor about what can help you quit smoking. Some things you can do (strategies) include:  Quitting smoking totally, instead of slowly cutting back how much you smoke over a period of time.  Going to in-person counseling. You are more likely to quit if you go to many counseling sessions.  Using resources and support systems, such as: ? Online chats with a counselor. ? Phone quitlines. ? Printed self-help materials. ? Support groups or group counseling. ? Text messaging programs. ? Mobile phone apps or applications.  Taking medicines. Some of these medicines may have nicotine in them. If you are pregnant or breastfeeding, do not take any medicines to quit smoking unless your doctor says it is okay. Talk with your doctor about counseling or other things that can help you.  Talk with your doctor about using more than one strategy at the same time, such as taking medicines while you are also going to in-person counseling. This can help make  quitting easier. What things can I do to make it easier to quit? Quitting smoking might feel very hard at first, but there is a lot that you can do to make it easier. Take these steps:  Talk to your family and friends. Ask them to support and encourage you.  Call phone quitlines, reach out to support groups, or work with a counselor.  Ask people who smoke to not smoke around you.  Avoid places that make you want (trigger) to smoke, such as: ? Bars. ? Parties. ? Smoke-break areas at work.  Spend time with people who do not smoke.  Lower the stress in your life. Stress can make you want to smoke. Try these things to help your stress: ? Getting regular exercise. ? Deep-breathing exercises. ? Yoga. ? Meditating. ? Doing a body scan. To do this, close your eyes, focus on one area of your body at a time from head to toe, and notice which parts of your body are tense. Try to relax the muscles in those areas.  Download or buy apps on your mobile phone or tablet that can help you stick to your quit plan. There are many free apps, such as QuitGuide from the CDC (Centers for Disease Control and Prevention). You can find more support from smokefree.gov and other websites.  This information is not intended to replace advice given to you by your health care provider. Make sure you discuss any questions you have with your health care provider. Document Released: 08/13/2009 Document   Revised: 06/14/2016 Document Reviewed: 03/03/2015 Elsevier Interactive Patient Education  2018 [employer] Inc.  
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
## 2017-06-02 ENCOUNTER — Ambulatory Visit (HOSPITAL_COMMUNITY): Payer: Medicare Other | Attending: Orthopaedic Surgery | Admitting: Physical Therapy

## 2017-06-02 ENCOUNTER — Encounter (HOSPITAL_COMMUNITY): Payer: Self-pay | Admitting: Physical Therapy

## 2017-06-02 DIAGNOSIS — M62830 Muscle spasm of back: Secondary | ICD-10-CM | POA: Diagnosis not present

## 2017-06-02 DIAGNOSIS — M545 Low back pain, unspecified: Secondary | ICD-10-CM
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
## 2017-06-02 NOTE — Patient Instructions (Signed)
DOUBLE KNEE TO CHEST STRETCH - [place]  While Lying on your back,  hold your knees and gently pull them up towards your chest. x10 reps    SINGLE KNEE TO CHEST STRETCH - [facility]  While lying on your back, use your hands and gently draw up a knee towards your chest.   Keep your other knee straight and lying on the ground.  x10 reps each    Supine trunk rotation  Starting position: arms in a "T" shape, knees bent  Movement: Lower both legs down to one side, keeping opposite shoulder on mat.  Hold for 30 seconds and rotate to other side.   *Add rotation of head/neck to opposite side if you would like a neck stretch.  Make sure your head is in a neutral position and chin tucked when you rotate  x10 reps each side    seated trunk rotation   Sit with you feet laced around the front legs of the chair now grab the right side of the chair with both hands and now pull your trunk and rotate your trunk to the right until your chest is pointing toward the right side now repeat on both sides

## 2017-06-15 ENCOUNTER — Ambulatory Visit: Payer: Medicare Other | Admitting: Orthopaedic Surgery

## 2017-06-21 ENCOUNTER — Ambulatory Visit (INDEPENDENT_AMBULATORY_CARE_PROVIDER_SITE_OTHER): Payer: Medicare Other | Admitting: Orthopaedic Surgery

## 2017-06-21 ENCOUNTER — Encounter: Payer: Self-pay | Admitting: Orthopaedic Surgery

## 2017-06-21 VITALS — BP 103/66 | HR 76 | Temp 96.9°F | Ht 65.0 in | Wt 129.0 lb

## 2017-06-21 DIAGNOSIS — F17210 Nicotine dependence, cigarettes, uncomplicated: Secondary | ICD-10-CM | POA: Diagnosis not present

## 2017-06-21 DIAGNOSIS — M25511 Pain in right shoulder: Secondary | ICD-10-CM | POA: Diagnosis not present

## 2017-06-21 DIAGNOSIS — G8929 Other chronic pain: Secondary | ICD-10-CM

## 2017-06-21 MED ORDER — HYDROCODONE-ACETAMINOPHEN 5-325 MG PO TABS: 1.0000 | ORAL_TABLET | Freq: Four times a day (QID) | ORAL | 0 refills | Status: DC | PRN

## 2017-06-21 NOTE — Progress Notes (Signed)
Patient Aaron Spence, male DOB:05/21/1976, 41 y.o. VPX:[id number]  Chief Complaint  Patient presents with  . Follow-up    Shoulder and back    HPI  Aaron Spence is a 41 y.o. male who has chronic right shoulder pain.  He has been to PT and is doing his exercises. He has no numbness.  He has no new trauma or redness or swelling. HPI  Body mass index is 21.47 kg/m.  ROS  Review of Systems  Constitutional:       Patient does not have Diabetes Mellitus. Patient has hypertension. Patient has COPD or shortness of breath. Patient does not have BMI > 35. Patient has current smoking history.  HENT: Negative for congestion.   Respiratory: Positive for cough and shortness of breath.   Cardiovascular: Negative for chest pain.  Endocrine: Positive for cold intolerance.  Musculoskeletal: Positive for arthralgias.  Allergic/Immunologic: Negative for environmental allergies.    Past Medical History:  Diagnosis Date  . Bronchitis   . Tobacco use     No past surgical history on file.  Family History  Problem Relation Age of Onset  . Bronchiolitis Unknown     Social History Social History  Substance Use Topics  . Smoking status: Current Every Day Smoker    Packs/day: 1.50    Years: 20.00    Types: Cigarettes  . Smokeless tobacco: Never Used  . Alcohol use No    No Known Allergies  Current Outpatient Prescriptions  Medication Sig Dispense Refill  . cyclobenzaprine (FLEXERIL) 10 MG tablet Take 1 tablet (10 mg total) by mouth 3 (three) times daily. 20 tablet 0  . diclofenac (VOLTAREN) 75 MG EC tablet Take 1 tablet (75 mg total) by mouth 2 (two) times daily. 14 tablet 0  . HYDROcodone-acetaminophen (NORCO/VICODIN) 5-325 MG tablet Take 1 tablet by mouth every 6 (six) hours as needed for moderate pain (Must last 14 days.Do not take and drive a car or use machinery.). 45 tablet 0  . ibuprofen (ADVIL,MOTRIN) 600 MG tablet Take 1 tablet (600 mg total) by mouth 4 (four)  times daily. 30 tablet 0   No current facility-administered medications for this visit.      Physical Exam  Blood pressure 103/66, pulse 76, temperature (!) 96.9 F (36.1 C), height 5\' 5"  (1.651 m), weight 129 lb (58.5 kg).  Constitutional: overall normal hygiene, normal nutrition, well developed, normal grooming, normal body habitus. Assistive device:none  Musculoskeletal: gait and station Limp none, muscle tone and strength are normal, no tremors or atrophy is present.  .  Neurological: coordination overall normal.  Deep tendon reflex/nerve stretch intact.  Sensation normal.  Cranial nerves II-XII intact.   Skin:   Normal overall no scars, lesions, ulcers or rashes. No psoriasis.  Psychiatric: Alert and oriented x 3.  Recent memory intact, remote memory unclear.  Normal mood and affect. Well groomed.  Good eye contact.  Cardiovascular: overall no swelling, no varicosities, no edema bilaterally, normal temperatures of the legs and arms, no clubbing, cyanosis and good capillary refill.  Lymphatic: palpation is normal.  Examination of right Upper Extremity is done.  Inspection:   Overall:  Elbow non-tender without crepitus or defects, forearm non-tender without crepitus or defects, wrist non-tender without crepitus or defects, hand non-tender.    Shoulder: with glenohumeral joint tenderness, without effusion.   Upper arm: without swelling and tenderness   Range of motion:   Overall:  Full range of motion of the elbow, full range of motion of  wrist and full range of motion in fingers.   Shoulder:  right  165 degrees forward flexion; 145 degrees abduction; 30 degrees internal rotation, 30 degrees external rotation, 15 degrees extension, 40 degrees adduction.   Stability:   Overall:  Shoulder, elbow and wrist stable   Strength and Tone:   Overall full shoulder muscles strength, full upper arm strength and normal upper arm bulk and tone.  He continues to smoke and is not willing  to cut back.  The patient has been educated about the nature of the problem(s) and counseled on treatment options.  The patient appeared to understand what I have discussed and is in agreement with it.  Encounter Diagnoses  Name Primary?  . Chronic right shoulder pain Yes  . Cigarette nicotine dependence without complication     PLAN Call if any problems.  Precautions discussed.  Continue current medications.   Return to clinic 6 weeks   I have reviewed the [facility] web site prior to prescribing narcotic medicine for this patient. Electronically Signed Sanjuana Kava, MD 8/22/201811:10 AM

## 2017-06-21 NOTE — Patient Instructions (Signed)
Steps to Quit Smoking Smoking tobacco can be bad for your health. It can also affect almost every organ in your body. Smoking puts you and people around you at risk for many serious Lenna Hagarty-lasting (chronic) diseases. Quitting smoking is hard, but it is one of the best things that you can do for your health. It is never too late to quit. What are the benefits of quitting smoking? When you quit smoking, you lower your risk for getting serious diseases and conditions. They can include:  Lung cancer or lung disease.  Heart disease.  Stroke.  Heart attack.  Not being able to have children (infertility).  Weak bones (osteoporosis) and broken bones (fractures).  If you have coughing, wheezing, and shortness of breath, those symptoms may get better when you quit. You may also get sick less often. If you are pregnant, quitting smoking can help to lower your chances of having a baby of low birth weight. What can I do to help me quit smoking? Talk with your doctor about what can help you quit smoking. Some things you can do (strategies) include:  Quitting smoking totally, instead of slowly cutting back how much you smoke over a period of time.  Going to in-person counseling. You are more likely to quit if you go to many counseling sessions.  Using resources and support systems, such as: ? Online chats with a counselor. ? Phone quitlines. ? Printed self-help materials. ? Support groups or group counseling. ? Text messaging programs. ? Mobile phone apps or applications.  Taking medicines. Some of these medicines may have nicotine in them. If you are pregnant or breastfeeding, do not take any medicines to quit smoking unless your doctor says it is okay. Talk with your doctor about counseling or other things that can help you.  Talk with your doctor about using more than one strategy at the same time, such as taking medicines while you are also going to in-person counseling. This can help make  quitting easier. What things can I do to make it easier to quit? Quitting smoking might feel very hard at first, but there is a lot that you can do to make it easier. Take these steps:  Talk to your family and friends. Ask them to support and encourage you.  Call phone quitlines, reach out to support groups, or work with a counselor.  Ask people who smoke to not smoke around you.  Avoid places that make you want (trigger) to smoke, such as: ? Bars. ? Parties. ? Smoke-break areas at work.  Spend time with people who do not smoke.  Lower the stress in your life. Stress can make you want to smoke. Try these things to help your stress: ? Getting regular exercise. ? Deep-breathing exercises. ? Yoga. ? Meditating. ? Doing a body scan. To do this, close your eyes, focus on one area of your body at a time from head to toe, and notice which parts of your body are tense. Try to relax the muscles in those areas.  Download or buy apps on your mobile phone or tablet that can help you stick to your quit plan. There are many free apps, such as QuitGuide from the CDC (Centers for Disease Control and Prevention). You can find more support from smokefree.gov and other websites.  This information is not intended to replace advice given to you by your health care provider. Make sure you discuss any questions you have with your health care provider. Document Released: 08/13/2009 Document   Revised: 06/14/2016 Document Reviewed: 03/03/2015 Elsevier Interactive Patient Education  2018 [employer] Inc.  

## 2017-07-11 ENCOUNTER — Emergency Department (HOSPITAL_COMMUNITY)
Admission: EM | Admit: 2017-07-11 | Discharge: 2017-07-11 | Disposition: A | Discharge status: Home / Self Care | Payer: Medicare Other | Attending: Emergency Medicine | Admitting: Emergency Medicine

## 2017-07-11 ENCOUNTER — Emergency Department (HOSPITAL_COMMUNITY): Payer: Medicare Other

## 2017-07-11 ENCOUNTER — Encounter (HOSPITAL_COMMUNITY): Payer: Self-pay

## 2017-07-11 DIAGNOSIS — Z79899 Other long term (current) drug therapy: Secondary | ICD-10-CM | POA: Diagnosis not present

## 2017-07-11 DIAGNOSIS — J069 Acute upper respiratory infection, unspecified: Secondary | ICD-10-CM | POA: Diagnosis not present

## 2017-07-11 DIAGNOSIS — R05 Cough: Secondary | ICD-10-CM | POA: Diagnosis not present

## 2017-07-11 DIAGNOSIS — F17210 Nicotine dependence, cigarettes, uncomplicated: Secondary | ICD-10-CM | POA: Diagnosis not present

## 2017-07-11 MED ORDER — BENZONATATE 100 MG PO CAPS: 100.0000 mg | ORAL_CAPSULE | Freq: Three times a day (TID) | ORAL | 0 refills | Status: DC

## 2017-07-11 MED ORDER — ALBUTEROL SULFATE HFA 108 (90 BASE) MCG/ACT IN AERS
1.0000 | INHALATION_SPRAY | Freq: Once | RESPIRATORY_TRACT | Status: AC
  Administered 2017-07-11: 1 via RESPIRATORY_TRACT
  Filled 2017-07-11: qty 6.7

## 2017-07-11 MED ORDER — SALINE SPRAY 0.65 % NA SOLN: 1.0000 | NASAL | 0 refills | Status: DC | PRN

## 2017-07-11 NOTE — ED Provider Notes (Signed)
[facility] DEPT Provider Note   CSN: [id number] Arrival date & time: 07/11/17  2155     History   Chief Complaint Chief Complaint  Patient presents with  . Cough    HPI Aaron Spence is a 41 y.o. male with history of back or use of bronchitis who presents with a 2 day history of cough and congestion. He has been taking over-the-counter cough medicine without relief. He denies fevers, chest pain, shortness of breath, abdominal pain, nausea, vomiting. He does request an inhaler, as he does not want to get point of wheezing and shortness of breath, as he has a past with similar symptoms. He denies any sore throat or ear pain.  HPI  Past Medical History:  Diagnosis Date  . Bronchitis   . Tobacco use     Patient Active Problem List   Diagnosis Date Noted  . Right shoulder pain 12/08/2015  . Pain in joint, shoulder region 05/05/2014  . Muscle weakness (generalized) 05/05/2014    History reviewed. No pertinent surgical history.     Home Medications    Prior to Admission medications   Medication Sig Start Date End Date Taking? Authorizing Provider  benzonatate (TESSALON) 100 MG capsule Take 1 capsule (100 mg total) by mouth every 8 (eight) hours. 07/11/17   Keyron Pokorski, Bea Graff, PA-C  cyclobenzaprine (FLEXERIL) 10 MG tablet Take 1 tablet (10 mg total) by mouth 3 (three) times daily. 05/14/17   Lily Kocher, PA-C  diclofenac (VOLTAREN) 75 MG EC tablet Take 1 tablet (75 mg total) by mouth 2 (two) times daily. 09/14/16   Lily Kocher, PA-C  HYDROcodone-acetaminophen (NORCO/VICODIN) 5-325 MG tablet Take 1 tablet by mouth every 6 (six) hours as needed for moderate pain (Must last 14 days.Do not take and drive a car or use machinery.). 06/21/17   Sanjuana Kava, MD  ibuprofen (ADVIL,MOTRIN) 600 MG tablet Take 1 tablet (600 mg total) by mouth 4 (four) times daily. 05/14/17   Lily Kocher, PA-C  sodium chloride (OCEAN) 0.65 % SOLN nasal spray Place 1 spray into both nostrils as  needed for congestion. 07/11/17   Frederica Kuster, PA-C    Family History Family History  Problem Relation Age of Onset  . Bronchiolitis Unknown     Social History Social History  Substance Use Topics  . Smoking status: Current Every Day Smoker    Packs/day: 1.50    Years: 20.00    Types: Cigarettes  . Smokeless tobacco: Never Used  . Alcohol use No     Allergies   Patient has no known allergies.   Review of Systems Review of Systems  Constitutional: Negative for chills and fever.  HENT: Positive for congestion. Negative for facial swelling and sore throat.   Respiratory: Positive for cough. Negative for chest tightness, shortness of breath and wheezing.   Cardiovascular: Negative for chest pain.  Gastrointestinal: Negative for abdominal pain, nausea and vomiting.  Psychiatric/Behavioral: The patient is not nervous/anxious.      Physical Exam Updated Vital Signs BP 111/74 (BP Location: Right Arm)   Pulse 99   Temp 98.2 F (36.8 C) (Oral)   Resp 17   Ht 5\' 5"  (1.651 m)   Wt 58.5 kg (129 lb)   SpO2 97%   BMI 21.47 kg/m   Physical Exam  Constitutional: He appears well-developed and well-nourished. No distress.  HENT:  Head: Normocephalic and atraumatic.  Mouth/Throat: Oropharynx is clear and moist. No oropharyngeal exudate.  Eyes: Pupils are equal, round, and reactive  to light. Conjunctivae are normal. Right eye exhibits no discharge. Left eye exhibits no discharge. No scleral icterus.  Neck: Normal range of motion. Neck supple. No thyromegaly present.  Cardiovascular: Normal rate, regular rhythm, normal heart sounds and intact distal pulses.  Exam reveals no gallop and no friction rub.   No murmur heard. Pulmonary/Chest: Effort normal. No stridor. No respiratory distress. He has decreased breath sounds. He has no wheezes. He has no rhonchi. He has no rales.  Abdominal: Soft. Bowel sounds are normal. He exhibits no distension. There is no tenderness. There is  no rebound and no guarding.  Musculoskeletal: He exhibits no edema.  Lymphadenopathy:    He has no cervical adenopathy.  Neurological: He is alert. Coordination normal.  Skin: Skin is warm and dry. No rash noted. He is not diaphoretic. No pallor.  Psychiatric: He has a normal mood and affect.  Nursing note and vitals reviewed.    ED Treatments / Results  Labs (all labs ordered are listed, but only abnormal results are displayed) Labs Reviewed - No data to display  EKG  EKG Interpretation None       Radiology Dg Chest 2 View  Result Date: 07/11/2017 CLINICAL DATA:  Cough and congestion x2 days. 1.5 pack per day current smoker times 20 years. EXAM: CHEST  2 VIEW COMPARISON:  None. FINDINGS: Emphysematous hyperinflation of the lungs without pneumonic consolidation, CHF, effusion or pneumothorax. Heart and mediastinal contours are within normal limits. No acute osseous abnormality. IMPRESSION: Emphysematous hyperinflation the lungs.  No pneumonic consolidation. Electronically Signed   By: Ashley Royalty M.D.   On: 07/11/2017 22:41    Procedures Procedures (including critical care time)  Medications Ordered in ED Medications  albuterol (PROVENTIL HFA;VENTOLIN HFA) 108 (90 Base) MCG/ACT inhaler 1 puff (1 puff Inhalation Given 07/11/17 2259)     Initial Impression / Assessment and Plan / ED Course  I have reviewed the triage vital signs and the nursing notes.  Pertinent labs & imaging results that were available during my care of the patient were reviewed by me and considered in my medical decision making (see chart for details).     Patient with a 2 day history of cough and congestion. Chest x-ray shows emphysematous hyperinflation in the lungs, but no pneumonic consolidation. No fevers at home. Will treat symptomatically. Patient requests albuterol inhaler, as he is out of his home. We'll discharge home with Tessalon, nasal saline for congestion. Return precautions discussed.  Follow-up to PCP. Patient discharged in satisfactory condition. Patient also evaluated by Dr. Laverta Baltimore who data the patient's management and agrees with plan.  Final Clinical Impressions(s) / ED Diagnoses   Final diagnoses:  Upper respiratory tract infection, unspecified type    New Prescriptions New Prescriptions   BENZONATATE (TESSALON) 100 MG CAPSULE    Take 1 capsule (100 mg total) by mouth every 8 (eight) hours.   SODIUM CHLORIDE (OCEAN) 0.65 % SOLN NASAL SPRAY    Place 1 spray into both nostrils as needed for congestion.     Frederica Kuster, PA-C 07/11/17 2320    Margette Fast, MD 07/12/17 1341

## 2017-07-11 NOTE — ED Triage Notes (Addendum)
Reports of cough and congestion x2 days. Reports of taking cough medicine.

## 2017-07-11 NOTE — Discharge Instructions (Signed)
Please see your primary care provider for follow-up of today's visit. Use albuterol inhaler every 4-6 hours with 1-2 puffs as needed for cough, wheezing, or shortness of breath. You can take Tessalon every 8 hours as needed for cough. Use saline spray to help with your nasal congestion. Please return to the emergency department if you develop any new or worsening symptoms.

## 2017-07-11 NOTE — ED Notes (Signed)
Pt ambulatory to waiting room. Pt verbalized understanding of discharge instructions.   

## 2017-08-02 ENCOUNTER — Ambulatory Visit: Payer: Medicare Other | Admitting: Orthopaedic Surgery

## 2017-08-14 ENCOUNTER — Encounter (HOSPITAL_COMMUNITY): Payer: Self-pay

## 2017-08-14 ENCOUNTER — Emergency Department (HOSPITAL_COMMUNITY)
Admission: EM | Admit: 2017-08-14 | Discharge: 2017-08-14 | Disposition: A | Discharge status: Home / Self Care | Payer: Medicare Other | Attending: Emergency Medicine | Admitting: Emergency Medicine

## 2017-08-14 DIAGNOSIS — R6889 Other general symptoms and signs: Secondary | ICD-10-CM

## 2017-08-14 DIAGNOSIS — R51 Headache: Secondary | ICD-10-CM | POA: Diagnosis not present

## 2017-08-14 DIAGNOSIS — R05 Cough: Secondary | ICD-10-CM | POA: Insufficient documentation

## 2017-08-14 DIAGNOSIS — F17210 Nicotine dependence, cigarettes, uncomplicated: Secondary | ICD-10-CM | POA: Insufficient documentation

## 2017-08-14 DIAGNOSIS — R509 Fever, unspecified: Secondary | ICD-10-CM | POA: Diagnosis not present

## 2017-08-14 DIAGNOSIS — Z79899 Other long term (current) drug therapy: Secondary | ICD-10-CM | POA: Diagnosis not present

## 2017-08-14 MED ORDER — ACETAMINOPHEN 325 MG PO TABS
ORAL_TABLET | ORAL | Status: AC
  Filled 2017-08-14: qty 2

## 2017-08-14 MED ORDER — ACETAMINOPHEN 325 MG PO TABS
650.0000 mg | ORAL_TABLET | Freq: Once | ORAL | Status: AC
  Administered 2017-08-14: 650 mg via ORAL

## 2017-08-14 NOTE — ED Triage Notes (Signed)
Reports of headache that started last night. Has not taken anything for pain.

## 2017-08-14 NOTE — ED Notes (Signed)
Pt states that his headache is gone after being given Tylenol.

## 2017-08-14 NOTE — Discharge Instructions (Signed)
As discussed,  your exam is reassuring tonight.  I suspect you have a viral illness that should resolve with rest and doses of ibuprofen (motrin) or tylenol (acetaminophen) which will help with fever, headache and body aches. Make you are staying hydrated by drinking plenty of fluids.

## 2017-08-17 ENCOUNTER — Ambulatory Visit: Payer: Medicare Other | Admitting: Orthopaedic Surgery

## 2017-08-17 NOTE — ED Provider Notes (Signed)
[facility] EMERGENCY DEPARTMENT Provider Note   CSN: [id number] Arrival date & time: 08/14/17  1550     History   Chief Complaint Chief Complaint  Patient presents with  . Headache    HPI Aaron Spence is a 41 y.o. male presenting with a 1 day history of flu like symptoms which includes headache, subjective fever, dry cough and body aches.  Symptoms do not include sore throat, nasal congestion, neck pain or stiffness, dizziness, vision changes, ear pain, shortness of breath, chest pain,  Nausea, vomiting or diarrhea.  The patient has had no medicines prior to arrival but was given tylenol upon first arrival here and reports complete resolution of his symptoms at this time.   The history is provided by the patient.    Past Medical History:  Diagnosis Date  . Bronchitis   . Tobacco use     Patient Active Problem List   Diagnosis Date Noted  . Right shoulder pain 12/08/2015  . Pain in joint, shoulder region 05/05/2014  . Muscle weakness (generalized) 05/05/2014    History reviewed. No pertinent surgical history.     Home Medications    Prior to Admission medications   Medication Sig Start Date End Date Taking? Authorizing Provider  benzonatate (TESSALON) 100 MG capsule Take 1 capsule (100 mg total) by mouth every 8 (eight) hours. Patient not taking: Reported on 08/14/2017 07/11/17   Frederica Kuster, PA-C  cyclobenzaprine (FLEXERIL) 10 MG tablet Take 1 tablet (10 mg total) by mouth 3 (three) times daily. Patient not taking: Reported on 08/14/2017 05/14/17   Lily Kocher, PA-C  diclofenac (VOLTAREN) 75 MG EC tablet Take 1 tablet (75 mg total) by mouth 2 (two) times daily. 09/14/16   Lily Kocher, PA-C  HYDROcodone-acetaminophen (NORCO/VICODIN) 5-325 MG tablet Take 1 tablet by mouth every 6 (six) hours as needed for moderate pain (Must last 14 days.Do not take and drive a car or use machinery.). Patient not taking: Reported on 08/14/2017 06/21/17   Sanjuana Kava,  MD  ibuprofen (ADVIL,MOTRIN) 600 MG tablet Take 1 tablet (600 mg total) by mouth 4 (four) times daily. Patient not taking: Reported on 08/14/2017 05/14/17   Lily Kocher, PA-C  sodium chloride (OCEAN) 0.65 % SOLN nasal spray Place 1 spray into both nostrils as needed for congestion. Patient not taking: Reported on 08/14/2017 07/11/17   Frederica Kuster, PA-C    Family History Family History  Problem Relation Age of Onset  . Bronchiolitis Unknown     Social History Social History  Substance Use Topics  . Smoking status: Current Every Day Smoker    Packs/day: 1.50    Years: 20.00    Types: Cigarettes  . Smokeless tobacco: Never Used  . Alcohol use No     Allergies   Patient has no known allergies.   Review of Systems Review of Systems  Constitutional: Positive for fever. Negative for chills.  HENT: Negative for congestion, ear pain, rhinorrhea, sinus pressure, sore throat, trouble swallowing and voice change.   Eyes: Negative.  Negative for discharge.  Respiratory: Positive for cough. Negative for shortness of breath, wheezing and stridor.   Cardiovascular: Negative.  Negative for chest pain.  Gastrointestinal: Negative.  Negative for abdominal pain.  Genitourinary: Negative.   Musculoskeletal: Positive for myalgias. Negative for neck pain and neck stiffness.  Skin: Negative for rash.  Neurological: Positive for headaches.     Physical Exam Updated Vital Signs BP 109/72   Pulse 96   Temp 100.2  F (37.9 C) (Oral)   Resp 18   Ht 5\' 6"  (1.676 m)   Wt 56.7 kg (125 lb)   SpO2 97%   BMI 20.18 kg/m   Physical Exam  Constitutional: He is oriented to person, place, and time. He appears well-developed and well-nourished.  HENT:  Head: Normocephalic and atraumatic.  Right Ear: Tympanic membrane and ear canal normal.  Left Ear: Tympanic membrane and ear canal normal.  Nose: No mucosal edema or rhinorrhea.  Mouth/Throat: Uvula is midline, oropharynx is clear and moist  and mucous membranes are normal. No oropharyngeal exudate, posterior oropharyngeal edema, posterior oropharyngeal erythema or tonsillar abscesses.  Eyes: Conjunctivae are normal.  Neck: Normal range of motion.  Cardiovascular: Normal rate and normal heart sounds.   Pulmonary/Chest: Effort normal. No respiratory distress. He has no wheezes. He has no rales.  Lungs ctab.  Abdominal: Soft. He exhibits no distension. There is no tenderness. There is no guarding.  Musculoskeletal: Normal range of motion.  Lymphadenopathy:    He has no cervical adenopathy.  Neurological: He is alert and oriented to person, place, and time. He has normal strength. No cranial nerve deficit or sensory deficit. He exhibits normal muscle tone.  Skin: Skin is warm and dry. No rash noted.  Psychiatric: He has a normal mood and affect.     ED Treatments / Results  Labs (all labs ordered are listed, but only abnormal results are displayed) Labs Reviewed - No data to display  EKG  EKG Interpretation None       Radiology No results found.  Procedures Procedures (including critical care time)  Medications Ordered in ED Medications  acetaminophen (TYLENOL) tablet 650 mg (650 mg Oral Given 08/14/17 2120)     Initial Impression / Assessment and Plan / ED Course  I have reviewed the triage vital signs and the nursing notes.  Pertinent labs & imaging results that were available during my care of the patient were reviewed by me and considered in my medical decision making (see chart for details).     Pt with low grade fever, headache, myalgias resolved with tylenol.  Exam negative with no evidence of meningitis, lungs clear without respiratory complaint.  Pt with probable viral syndrome. Encouraged rest, increased fluids and continuing to tx fever/headache with tylenol.  Strict return precautions discussed, or f/u with pcp.   Final Clinical Impressions(s) / ED Diagnoses   Final diagnoses:  Flu-like  symptoms    New Prescriptions Discharge Medication List as of 08/14/2017 11:06 PM       Evalee Jefferson, PA-C 08/17/17 1037    Julianne Rice, MD 08/25/17 [phone]

## 2017-08-22 ENCOUNTER — Ambulatory Visit: Payer: Medicare Other | Admitting: Orthopaedic Surgery

## 2017-08-24 ENCOUNTER — Ambulatory Visit (INDEPENDENT_AMBULATORY_CARE_PROVIDER_SITE_OTHER): Payer: Medicare Other | Admitting: Orthopaedic Surgery

## 2017-08-24 ENCOUNTER — Encounter: Payer: Self-pay | Admitting: Orthopaedic Surgery

## 2017-08-24 VITALS — BP 106/73 | HR 62 | Temp 97.0°F | Ht 65.0 in | Wt 127.0 lb

## 2017-08-24 DIAGNOSIS — M25511 Pain in right shoulder: Secondary | ICD-10-CM

## 2017-08-24 DIAGNOSIS — F17210 Nicotine dependence, cigarettes, uncomplicated: Secondary | ICD-10-CM

## 2017-08-24 DIAGNOSIS — G8929 Other chronic pain: Secondary | ICD-10-CM | POA: Diagnosis not present

## 2017-08-24 MED ORDER — HYDROCODONE-ACETAMINOPHEN 5-325 MG PO TABS: 1.0000 | ORAL_TABLET | Freq: Four times a day (QID) | ORAL | 0 refills | Status: DC | PRN

## 2017-08-24 MED ORDER — DICLOFENAC SODIUM 75 MG PO TBEC: 75.0000 mg | DELAYED_RELEASE_TABLET | Freq: Two times a day (BID) | ORAL | 2 refills | Status: DC

## 2017-08-24 NOTE — Patient Instructions (Signed)

## 2017-08-24 NOTE — Progress Notes (Signed)
Patient Aaron Spence, male DOB:May 16, 1976, 41 y.o. FTD:[id number]  Chief Complaint  Patient presents with  . Shoulder Pain    HPI  MINGO SIEGERT is a 41 y.o. male who has chronic right shoulder pain.  He has no new trauma, no paresthesias.  He is doing his exercises. He has more pain with overhead use.  He has not been taking his NSAID.  I will restart them. HPI  Body mass index is 21.13 kg/m.  ROS  Review of Systems  Constitutional:       Patient does not have Diabetes Mellitus. Patient has hypertension. Patient has COPD or shortness of breath. Patient does not have BMI > 35. Patient has current smoking history.  HENT: Negative for congestion.   Respiratory: Positive for cough and shortness of breath.   Cardiovascular: Negative for chest pain.  Endocrine: Positive for cold intolerance.  Musculoskeletal: Positive for arthralgias.  Allergic/Immunologic: Negative for environmental allergies.    Past Medical History:  Diagnosis Date  . Bronchitis   . Tobacco use     No past surgical history on file.  Family History  Problem Relation Age of Onset  . Bronchiolitis Unknown     Social History Social History  Substance Use Topics  . Smoking status: Current Every Day Smoker    Packs/day: 1.50    Years: 20.00    Types: Cigarettes  . Smokeless tobacco: Never Used  . Alcohol use No    No Known Allergies  Current Outpatient Prescriptions  Medication Sig Dispense Refill  . diclofenac (VOLTAREN) 75 MG EC tablet Take 1 tablet (75 mg total) by mouth 2 (two) times daily with a meal. 60 tablet 2  . HYDROcodone-acetaminophen (NORCO/VICODIN) 5-325 MG tablet Take 1 tablet by mouth every 6 (six) hours as needed for moderate pain (Must last 14 days.Do not take and drive a car or use machinery.). 30 tablet 0  . sodium chloride (OCEAN) 0.65 % SOLN nasal spray Place 1 spray into both nostrils as needed for congestion. (Patient not taking: Reported on 08/14/2017) 30 mL 0    No current facility-administered medications for this visit.      Physical Exam  Blood pressure 106/73, pulse 62, temperature (!) 97 F (36.1 C), height 5\' 5"  (1.651 m), weight 127 lb (57.6 kg).  Constitutional: overall normal hygiene, normal nutrition, well developed, normal grooming, normal body habitus. Assistive device:none  Musculoskeletal: gait and station Limp none, muscle tone and strength are normal, no tremors or atrophy is present.  .  Neurological: coordination overall normal.  Deep tendon reflex/nerve stretch intact.  Sensation normal.  Cranial nerves II-XII intact.   Skin:   Normal overall no scars, lesions, ulcers or rashes. No psoriasis.  Psychiatric: Alert and oriented x 3.  Recent memory intact, remote memory unclear.  Normal mood and affect. Well groomed.  Good eye contact.  Cardiovascular: overall no swelling, no varicosities, no edema bilaterally, normal temperatures of the legs and arms, no clubbing, cyanosis and good capillary refill.  Lymphatic: palpation is normal.  All other systems reviewed and are negative   Examination of right Upper Extremity is done.  Inspection:   Overall:  Elbow non-tender without crepitus or defects, forearm non-tender without crepitus or defects, wrist non-tender without crepitus or defects, hand non-tender.    Shoulder: with glenohumeral joint tenderness, without effusion.   Upper arm: without swelling and tenderness   Range of motion:   Overall:  Full range of motion of the elbow, full range of motion  of wrist and full range of motion in fingers.   Shoulder:  right  180 degrees forward flexion; 165 degrees abduction; 30 degrees internal rotation, 30 degrees external rotation, 15 degrees extension, 40 degrees adduction.   Stability:   Overall:  Shoulder, elbow and wrist stable   Strength and Tone:   Overall full shoulder muscles strength, full upper arm strength and normal upper arm bulk and tone. The patient has been  educated about the nature of the problem(s) and counseled on treatment options.  The patient appeared to understand what I have discussed and is in agreement with it.  Encounter Diagnoses  Name Primary?  . Chronic right shoulder pain Yes  . Cigarette nicotine dependence without complication     PLAN Call if any problems.  Precautions discussed.  Continue current medications.   Return to clinic 3 months   I have reviewed the [facility] web site prior to prescribing narcotic medicine for this patient. Electronically Signed Sanjuana Kava, MD 10/25/201810:16 AM

## 2017-10-04 ENCOUNTER — Telehealth: Payer: Self-pay | Admitting: Orthopaedic Surgery

## 2017-10-04 MED ORDER — HYDROCODONE-ACETAMINOPHEN 5-325 MG PO TABS: 1.0000 | ORAL_TABLET | Freq: Four times a day (QID) | ORAL | 0 refills | Status: DC | PRN

## 2017-10-04 NOTE — Telephone Encounter (Signed)
Hydrocodone-Acetaminophen  5/325 mg  Qty 30 Tablets °

## 2017-10-29 ENCOUNTER — Other Ambulatory Visit: Payer: Self-pay

## 2017-10-29 ENCOUNTER — Encounter (HOSPITAL_COMMUNITY): Payer: Self-pay | Admitting: Emergency Medicine

## 2017-10-29 DIAGNOSIS — R05 Cough: Secondary | ICD-10-CM | POA: Diagnosis not present

## 2017-10-29 DIAGNOSIS — Z760 Encounter for issue of repeat prescription: Secondary | ICD-10-CM | POA: Diagnosis not present

## 2017-10-29 DIAGNOSIS — F17210 Nicotine dependence, cigarettes, uncomplicated: Secondary | ICD-10-CM | POA: Insufficient documentation

## 2017-10-29 DIAGNOSIS — Z79899 Other long term (current) drug therapy: Secondary | ICD-10-CM | POA: Insufficient documentation

## 2017-10-29 DIAGNOSIS — R509 Fever, unspecified: Secondary | ICD-10-CM | POA: Insufficient documentation

## 2017-10-29 NOTE — ED Triage Notes (Signed)
Pt states "high fever" tonight, did not take his temperature, but felt hot, pt denies other s/s but states "I might need to get another inhaler too"

## 2017-10-30 ENCOUNTER — Other Ambulatory Visit: Payer: Self-pay

## 2017-10-30 ENCOUNTER — Emergency Department (HOSPITAL_COMMUNITY): Payer: Medicare Other

## 2017-10-30 ENCOUNTER — Emergency Department (HOSPITAL_COMMUNITY)
Admission: EM | Admit: 2017-10-30 | Discharge: 2017-10-30 | Disposition: A | Discharge status: Home / Self Care | Payer: Medicare Other | Attending: Emergency Medicine | Admitting: Emergency Medicine

## 2017-10-30 DIAGNOSIS — Z760 Encounter for issue of repeat prescription: Secondary | ICD-10-CM

## 2017-10-30 DIAGNOSIS — R509 Fever, unspecified: Secondary | ICD-10-CM | POA: Diagnosis not present

## 2017-10-30 MED ORDER — ALBUTEROL SULFATE HFA 108 (90 BASE) MCG/ACT IN AERS
2.0000 | INHALATION_SPRAY | Freq: Once | RESPIRATORY_TRACT | Status: AC
  Administered 2017-10-30: 2 via RESPIRATORY_TRACT
  Filled 2017-10-30: qty 6.7

## 2017-10-30 NOTE — Discharge Instructions (Signed)
Use the inhaler as prescribed.  Stop smoking.  Follow-up with your doctor.  Return to the ED if you develop new or worsening symptoms.

## 2017-10-30 NOTE — ED Provider Notes (Signed)
[facility] EMERGENCY DEPARTMENT Provider Note   CSN: [id number] Arrival date & time: 10/29/17  2227     History   Chief Complaint Chief Complaint  Patient presents with  . Fever    HPI Aaron Spence is a 41 y.o. male.  Smoker with history of bronchitis presenting with "high fever" at home earlier tonight.  Did not check his temperature but felt hot.  Feels like he might need another inhaler.  Denies any runny nose, cough, chest pain or shortness of breath.  No sore throat.  No nausea or vomiting.  No chest pain.  States he feels like he is breathing normally now.  Denies any sick contacts.  Good p.o. intake and urine output.   The history is provided by the patient.  Fever   Associated symptoms include cough. Pertinent negatives include no chest pain, no vomiting, no congestion and no headaches.    Past Medical History:  Diagnosis Date  . Bronchitis   . Tobacco use     Patient Active Problem List   Diagnosis Date Noted  . Right shoulder pain 12/08/2015  . Pain in joint, shoulder region 05/05/2014  . Muscle weakness (generalized) 05/05/2014    History reviewed. No pertinent surgical history.     Home Medications    Prior to Admission medications   Medication Sig Start Date End Date Taking? Authorizing Provider  diclofenac (VOLTAREN) 75 MG EC tablet Take 1 tablet (75 mg total) by mouth 2 (two) times daily with a meal. 08/24/17  Yes Sanjuana Kava, MD  HYDROcodone-acetaminophen (NORCO/VICODIN) 5-325 MG tablet Take 1 tablet by mouth every 6 (six) hours as needed for moderate pain (Must last 14 days.Do not take and drive a car or use machinery.). 10/04/17  Yes Sanjuana Kava, MD  sodium chloride (OCEAN) 0.65 % SOLN nasal spray Place 1 spray into both nostrils as needed for congestion. 07/11/17  Yes Law, Bea Graff, PA-C    Family History Family History  Problem Relation Age of Onset  . Bronchiolitis Unknown     Social History Social History   Tobacco Use   . Smoking status: Current Every Day Smoker    Packs/day: 1.50    Years: 20.00    Pack years: 30.00    Types: Cigarettes  . Smokeless tobacco: Never Used  Substance Use Topics  . Alcohol use: Yes    Alcohol/week: 1.2 oz    Types: 2 Cans of beer per week    Comment: occassional  . Drug use: No     Allergies   Patient has no known allergies.   Review of Systems Review of Systems  Constitutional: Positive for fever. Negative for activity change and appetite change.  HENT: Positive for rhinorrhea. Negative for congestion.   Eyes: Negative for visual disturbance.  Respiratory: Positive for cough. Negative for chest tightness and shortness of breath.   Cardiovascular: Negative for chest pain.  Gastrointestinal: Negative for abdominal pain, nausea and vomiting.  Genitourinary: Negative for dysuria, hematuria and testicular pain.  Musculoskeletal: Negative for arthralgias and myalgias.  Neurological: Negative for dizziness, weakness and headaches.    all other systems are negative except as noted in the HPI and PMH.    Physical Exam Updated Vital Signs BP 112/70 (BP Location: Right Arm)   Pulse 87   Temp 97.7 F (36.5 C) (Oral)   Resp 16   Ht 5\' 2"  (1.575 m)   Wt 56.7 kg (125 lb)   SpO2 99%   BMI 22.86 kg/m  Physical Exam  Constitutional: He is oriented to person, place, and time. He appears well-developed and well-nourished. No distress.  Well appearing.  No distress  HENT:  Head: Normocephalic and atraumatic.  Mouth/Throat: Oropharynx is clear and moist. No oropharyngeal exudate.  Bilateral cerumen impaction  Eyes: Conjunctivae and EOM are normal. Pupils are equal, round, and reactive to light.  Neck: Normal range of motion. Neck supple.  No meningismus.  Cardiovascular: Normal rate, regular rhythm, normal heart sounds and intact distal pulses.  No murmur heard. Pulmonary/Chest: Effort normal and breath sounds normal. No respiratory distress. He exhibits no  tenderness.  Abdominal: Soft. There is no tenderness. There is no rebound and no guarding.  Musculoskeletal: Normal range of motion. He exhibits no edema or tenderness.  Neurological: He is alert and oriented to person, place, and time. No cranial nerve deficit. He exhibits normal muscle tone. Coordination normal.  No ataxia on finger to nose bilaterally. No pronator drift. 5/5 strength throughout. CN 2-12 intact.Equal grip strength. Sensation intact.   Skin: Skin is warm.  Psychiatric: He has a normal mood and affect. His behavior is normal.  Nursing note and vitals reviewed.    ED Treatments / Results  Labs (all labs ordered are listed, but only abnormal results are displayed) Labs Reviewed - No data to display  EKG  EKG Interpretation None       Radiology Dg Chest 2 View  Result Date: 10/30/2017 CLINICAL DATA:  Acute onset of fever. EXAM: CHEST  2 VIEW COMPARISON:  Chest radiograph performed 07/11/2017 FINDINGS: The lungs are well-aerated and clear. There is no evidence of focal opacification, pleural effusion or pneumothorax. The heart is normal in size; the mediastinal contour is within normal limits. No acute osseous abnormalities are seen. IMPRESSION: No acute cardiopulmonary process seen. Electronically Signed   By: Garald Balding M.D.   On: 10/30/2017 03:06    Procedures Procedures (including critical care time)  Medications Ordered in ED Medications  albuterol (PROVENTIL HFA;VENTOLIN HFA) 108 (90 Base) MCG/ACT inhaler 2 puff (not administered)     Initial Impression / Assessment and Plan / ED Course  I have reviewed the triage vital signs and the nursing notes.  Pertinent labs & imaging results that were available during my care of the patient were reviewed by me and considered in my medical decision making (see chart for details).    Patient with subjective fever at home.  He is afebrile and appears well on exam.  Lungs are clear.  He is requesting refill of  his inhaler.  Chest x-ray is negative.  Patient tolerating p.o. and ambulatory.  No wheezing.  No hypoxia.  Will refill inhaler.  Discussed smoking cessation.  Follow-up with PCP.  Return precautions discussed  Final Clinical Impressions(s) / ED Diagnoses   Final diagnoses:  Medication refill    ED Discharge Orders    None       Manami Tutor, Annie Main, MD 10/30/17 [phone]

## 2017-11-14 ENCOUNTER — Telehealth: Payer: Self-pay | Admitting: Orthopaedic Surgery

## 2017-11-14 MED ORDER — HYDROCODONE-ACETAMINOPHEN 5-325 MG PO TABS: 1.0000 | ORAL_TABLET | Freq: Four times a day (QID) | ORAL | 0 refills | Status: DC | PRN

## 2017-11-14 NOTE — Telephone Encounter (Signed)
Request received for refill:  HYDROcodone-acetaminophen (NORCO/VICODIN) 5-325 MG tablet 30 tablet  - [facility], [employer]

## 2017-11-23 ENCOUNTER — Encounter: Payer: Self-pay | Admitting: Orthopaedic Surgery

## 2017-11-23 ENCOUNTER — Ambulatory Visit (INDEPENDENT_AMBULATORY_CARE_PROVIDER_SITE_OTHER): Payer: Medicare Other | Admitting: Orthopaedic Surgery

## 2017-11-23 VITALS — BP 117/74 | HR 113 | Ht 65.0 in | Wt 124.0 lb

## 2017-11-23 DIAGNOSIS — G8929 Other chronic pain: Secondary | ICD-10-CM | POA: Diagnosis not present

## 2017-11-23 DIAGNOSIS — F17210 Nicotine dependence, cigarettes, uncomplicated: Secondary | ICD-10-CM

## 2017-11-23 DIAGNOSIS — M25511 Pain in right shoulder: Secondary | ICD-10-CM | POA: Diagnosis not present

## 2017-11-23 MED ORDER — HYDROCODONE-ACETAMINOPHEN 5-325 MG PO TABS: ORAL_TABLET | ORAL | 0 refills | Status: DC

## 2017-11-23 NOTE — Progress Notes (Signed)
Patient Aaron Spence, male DOB:05/30/1976, 42 y.o. FYB:[id number]  Chief Complaint  Patient presents with  . Follow-up    Shoulders    HPI  Aaron Spence is a 42 y.o. male who has chronic right shoulder pain.  He has no new trauma.  He has had more pain in the cold weather.  He has done his exercises and taken his medicine.  He has no numbness.   HPI  Body mass index is 20.63 kg/m.  ROS  Review of Systems  Constitutional:       Patient does not have Diabetes Mellitus. Patient has hypertension. Patient has COPD or shortness of breath. Patient does not have BMI > 35. Patient has current smoking history.  HENT: Negative for congestion.   Respiratory: Positive for cough and shortness of breath.   Cardiovascular: Negative for chest pain.  Endocrine: Positive for cold intolerance.  Musculoskeletal: Positive for arthralgias.  Allergic/Immunologic: Negative for environmental allergies.  All other systems reviewed and are negative.   Past Medical History:  Diagnosis Date  . Bronchitis   . Tobacco use     History reviewed. No pertinent surgical history.  Family History  Problem Relation Age of Onset  . Bronchiolitis Unknown     Social History Social History   Tobacco Use  . Smoking status: Current Every Day Smoker    Packs/day: 1.50    Years: 20.00    Pack years: 30.00    Types: Cigarettes  . Smokeless tobacco: Never Used  Substance Use Topics  . Alcohol use: Yes    Alcohol/week: 1.2 oz    Types: 2 Cans of beer per week    Comment: occassional  . Drug use: No    No Known Allergies  Current Outpatient Medications  Medication Sig Dispense Refill  . diclofenac (VOLTAREN) 75 MG EC tablet Take 1 tablet (75 mg total) by mouth 2 (two) times daily with a meal. 60 tablet 2  . HYDROcodone-acetaminophen (NORCO/VICODIN) 5-325 MG tablet One tablet by mouth every six hours as needed for pain.  Limit 30 days. 30 tablet 0  . sodium chloride (OCEAN) 0.65 % SOLN nasal  spray Place 1 spray into both nostrils as needed for congestion. 30 mL 0   No current facility-administered medications for this visit.      Physical Exam  Blood pressure 117/74, pulse (!) 113, height 5\' 5"  (1.651 m), weight 124 lb (56.2 kg).  Constitutional: overall normal hygiene, normal nutrition, well developed, normal grooming, normal body habitus. Assistive device:none  Musculoskeletal: gait and station Limp none, muscle tone and strength are normal, no tremors or atrophy is present.  .  Neurological: coordination overall normal.  Deep tendon reflex/nerve stretch intact.  Sensation normal.  Cranial nerves II-XII intact.   Skin:   Normal overall no scars, lesions, ulcers or rashes. No psoriasis.  Psychiatric: Alert and oriented x 3.  Recent memory intact, remote memory unclear.  Normal mood and affect. Well groomed.  Good eye contact.  Cardiovascular: overall no swelling, no varicosities, no edema bilaterally, normal temperatures of the legs and arms, no clubbing, cyanosis and good capillary refill.  Lymphatic: palpation is normal.  All other systems reviewed and are negative   The patient has been educated about the nature of the problem(s) and counseled on treatment options.  The patient appeared to understand what I have discussed and is in agreement with it.  Encounter Diagnoses  Name Primary?  . Chronic right shoulder pain Yes  . Cigarette nicotine  dependence without complication     PLAN Call if any problems.  Precautions discussed.  Continue current medications.   Return to clinic 4 months   I have reviewed the [place] web site prior to prescribing narcotic medicine for this patient.  Electronically Signed Sanjuana Kava, MD 1/24/20193:06 PM

## 2017-11-27 DIAGNOSIS — Z5181 Encounter for therapeutic drug level monitoring: Secondary | ICD-10-CM | POA: Diagnosis not present

## 2017-11-29 DIAGNOSIS — Z5181 Encounter for therapeutic drug level monitoring: Secondary | ICD-10-CM | POA: Diagnosis not present

## 2017-12-01 DIAGNOSIS — Z5181 Encounter for therapeutic drug level monitoring: Secondary | ICD-10-CM | POA: Diagnosis not present

## 2017-12-04 DIAGNOSIS — Z5181 Encounter for therapeutic drug level monitoring: Secondary | ICD-10-CM | POA: Diagnosis not present

## 2017-12-06 DIAGNOSIS — Z5181 Encounter for therapeutic drug level monitoring: Secondary | ICD-10-CM | POA: Diagnosis not present

## 2017-12-12 DIAGNOSIS — Z5181 Encounter for therapeutic drug level monitoring: Secondary | ICD-10-CM | POA: Diagnosis not present

## 2017-12-14 DIAGNOSIS — Z5181 Encounter for therapeutic drug level monitoring: Secondary | ICD-10-CM | POA: Diagnosis not present

## 2017-12-18 DIAGNOSIS — Z5181 Encounter for therapeutic drug level monitoring: Secondary | ICD-10-CM | POA: Diagnosis not present

## 2017-12-20 DIAGNOSIS — Z5181 Encounter for therapeutic drug level monitoring: Secondary | ICD-10-CM | POA: Diagnosis not present

## 2017-12-24 ENCOUNTER — Emergency Department (HOSPITAL_COMMUNITY): Payer: Medicare Other

## 2017-12-24 ENCOUNTER — Emergency Department (HOSPITAL_COMMUNITY)
Admission: EM | Admit: 2017-12-24 | Discharge: 2017-12-24 | Disposition: A | Discharge status: Home / Self Care | Payer: Medicare Other | Attending: Emergency Medicine | Admitting: Emergency Medicine

## 2017-12-24 ENCOUNTER — Encounter (HOSPITAL_COMMUNITY): Payer: Self-pay | Admitting: Emergency Medicine

## 2017-12-24 DIAGNOSIS — F17210 Nicotine dependence, cigarettes, uncomplicated: Secondary | ICD-10-CM | POA: Diagnosis not present

## 2017-12-24 DIAGNOSIS — R0602 Shortness of breath: Secondary | ICD-10-CM | POA: Diagnosis not present

## 2017-12-24 DIAGNOSIS — Z859 Personal history of malignant neoplasm, unspecified: Secondary | ICD-10-CM | POA: Insufficient documentation

## 2017-12-24 DIAGNOSIS — R079 Chest pain, unspecified: Secondary | ICD-10-CM | POA: Diagnosis not present

## 2017-12-24 DIAGNOSIS — R0789 Other chest pain: Secondary | ICD-10-CM | POA: Diagnosis not present

## 2017-12-24 HISTORY — DX: Malignant (primary) neoplasm, unspecified: C80.1

## 2017-12-24 LAB — COMPREHENSIVE METABOLIC PANEL
ALBUMIN: 4.2 g/dL (ref 3.5–5.0)
ALK PHOS: 69 U/L (ref 38–126)
ALT: 25 U/L (ref 17–63)
ANION GAP: 10 (ref 5–15)
AST: 21 U/L (ref 15–41)
BUN: 15 mg/dL (ref 6–20)
CALCIUM: 9.2 mg/dL (ref 8.9–10.3)
CO2: 26 mmol/L (ref 22–32)
CREATININE: 0.94 mg/dL (ref 0.61–1.24)
Chloride: 103 mmol/L (ref 101–111)
GFR calc Af Amer: >60 mL/min (ref >60)
GFR calc non Af Amer: >60 mL/min (ref >60)
GLUCOSE: 72 mg/dL (ref 65–99)
Potassium: 3.7 mmol/L (ref 3.5–5.1)
SODIUM: 139 mmol/L (ref 135–145)
TOTAL PROTEIN: 6.8 g/dL (ref 6.5–8.1)
Total Bilirubin: 0.2 mg/dL — ABNORMAL LOW (ref 0.3–1.2)

## 2017-12-24 LAB — CBC
HEMATOCRIT: 42.3 % (ref 39.0–52.0)
Hemoglobin: 13.9 g/dL (ref 13.0–17.0)
MCH: 28.7 pg (ref 26.0–34.0)
MCHC: 32.9 g/dL (ref 30.0–36.0)
MCV: 87.2 fL (ref 78.0–100.0)
PLATELETS: 283 10*3/uL (ref 150–400)
RBC: 4.85 MIL/uL (ref 4.22–5.81)
RDW: 13.8 % (ref 11.5–15.5)
WBC: 7 10*3/uL (ref 4.0–10.5)

## 2017-12-24 LAB — I-STAT TROPONIN, ED: Troponin i, poc: 0 ng/mL (ref 0.00–0.08)

## 2017-12-24 MED ORDER — ALBUTEROL SULFATE HFA 108 (90 BASE) MCG/ACT IN AERS: 2.0000 | INHALATION_SPRAY | RESPIRATORY_TRACT | 0 refills | Status: DC | PRN

## 2017-12-24 NOTE — ED Triage Notes (Signed)
Pt states his chest has been hurting since 9pm last night. C/o mid sternal chest pain that radiates to L. Arm.

## 2017-12-24 NOTE — ED Provider Notes (Signed)
[facility] EMERGENCY DEPARTMENT Provider Note   CSN: [id number] Arrival date & time: 12/24/17  0019  Time seen 01:05 AM   History   Chief Complaint Chief Complaint  Patient presents with  . Chest Pain    HPI Aaron Spence is a 42 y.o. male.  HPI patient states he works at [employer] and they were very busy tonight and he was very active running around doing his job.  He got off work at 81 and states he went home and about 930 he started having a chest discomfort and he points to the lower central portion of his chest.  He states he ate and took a shower however the pain persisted.  He describes it as a tightness and he had mild shortness of breath with it.  He denies nausea or vomiting but thinks he was a little sweaty.  He denies any cough.  He states the pain lasted about 1 hour.  He states he feels fine now.  He states he is never had this pain before.  Patient's maternal grandfather had a history of heart disease otherwise but he also the family has heart problems.  PCP Rosita Fire, MD   Past Medical History:  Diagnosis Date  . Cancer ([place])     There are no active problems to display for this patient.   History reviewed. No pertinent surgical history.     Home Medications    Prior to Admission medications   Medication Sig Start Date End Date Taking? Authorizing Provider  HYDROcodone-acetaminophen (NORCO/VICODIN) 5-325 MG tablet Take 1 tablet by mouth every 6 (six) hours as needed for moderate pain.   Yes [provider]  albuterol (PROVENTIL HFA;VENTOLIN HFA) 108 (90 Base) MCG/ACT inhaler Inhale 2 puffs into the lungs every 4 (four) hours as needed. 12/24/17   Rolland Porter, MD    Family History No family history on file.  Social History Social History   Tobacco Use  . Smoking status: Current Every Day Smoker    Types: Cigarettes  . Smokeless tobacco: Never Used  Substance Use Topics  . Alcohol use: No    Frequency: Never  . Drug use: No  lives  with mother Smokes 2-3 cigs a day down from 1 ppd   Allergies   Patient has no known allergies.   Review of Systems Review of Systems  All other systems reviewed and are negative.    Physical Exam Updated Vital Signs BP 126/74   Pulse 91   Temp 98.1 F (36.7 C) (Oral)   Resp 17   Ht 5\' 6"  (1.676 m)   Wt 54.4 kg (120 lb)   SpO2 99%   BMI 19.37 kg/m   Vital signs normal    Physical Exam  Constitutional: He is oriented to person, place, and time. He appears well-developed and well-nourished.  Non-toxic appearance. He does not appear ill. No distress.  HENT:  Head: Normocephalic and atraumatic.  Right Ear: External ear normal.  Left Ear: External ear normal.  Nose: Nose normal. No mucosal edema or rhinorrhea.  Mouth/Throat: Oropharynx is clear and moist and mucous membranes are normal. No dental abscesses or uvula swelling.  Eyes: Conjunctivae and EOM are normal. Pupils are equal, round, and reactive to light.  Neck: Normal range of motion and full passive range of motion without pain. Neck supple.  Cardiovascular: Normal rate, regular rhythm and normal heart sounds. Exam reveals no gallop and no friction rub.  No murmur heard. Pulmonary/Chest: Effort normal  and breath sounds normal. No respiratory distress. He has no wheezes. He has no rhonchi. He has no rales. He exhibits no tenderness and no crepitus.  Area of chest pain noted    Abdominal: Soft. Normal appearance and bowel sounds are normal. He exhibits no distension. There is no tenderness. There is no rebound and no guarding.  Musculoskeletal: Normal range of motion. He exhibits no edema or tenderness.  Moves all extremities well.   Neurological: He is alert and oriented to person, place, and time. He has normal strength. No cranial nerve deficit.  Patient seems mentally slow  Skin: Skin is warm, dry and intact. No rash noted. No erythema. No pallor.  Psychiatric: He has a normal mood and affect. His speech is  normal and behavior is normal. His mood appears not anxious.  Nursing note and vitals reviewed.    ED Treatments / Results  Labs (all labs ordered are listed, but only abnormal results are displayed) Results for orders placed or performed during the hospital encounter of 12/24/17  CBC  Result Value Ref Range   WBC 7.0 4.0 - 10.5 K/uL   RBC 4.85 4.22 - 5.81 MIL/uL   Hemoglobin 13.9 13.0 - 17.0 g/dL   HCT 42.3 39.0 - 52.0 %   MCV 87.2 78.0 - 100.0 fL   MCH 28.7 26.0 - 34.0 pg   MCHC 32.9 30.0 - 36.0 g/dL   RDW 13.8 11.5 - 15.5 %   Platelets 283 150 - 400 K/uL  Comprehensive metabolic panel  Result Value Ref Range   Sodium 139 135 - 145 mmol/L   Potassium 3.7 3.5 - 5.1 mmol/L   Chloride 103 101 - 111 mmol/L   CO2 26 22 - 32 mmol/L   Glucose, Bld 72 65 - 99 mg/dL   BUN 15 6 - 20 mg/dL   Creatinine, Ser 0.94 0.61 - 1.24 mg/dL   Calcium 9.2 8.9 - 10.3 mg/dL   Total Protein 6.8 6.5 - 8.1 g/dL   Albumin 4.2 3.5 - 5.0 g/dL   AST 21 15 - 41 U/L   ALT 25 17 - 63 U/L   Alkaline Phosphatase 69 38 - 126 U/L   Total Bilirubin 0.2 (L) 0.3 - 1.2 mg/dL   GFR calc non Af Amer >60 >60 mL/min   GFR calc Af Amer >60 >60 mL/min   Anion gap 10 5 - 15  I-stat troponin, ED  Result Value Ref Range   Troponin i, poc 0.00 0.00 - 0.08 ng/mL   Comment 3           Laboratory interpretation all normal     EKG  EKG Interpretation  Date/Time:  Sunday December 24 2017 00:36:30 EST Ventricular Rate:  87 PR Interval:    QRS Duration: 80 QT Interval:  356 QTC Calculation: 429 R Axis:   88 Text Interpretation:  Sinus rhythm Anteroseptal infarct, age indeterminate No old tracing to compare Confirmed by Rolland Porter [phone]) on 12/24/2017 1:05:52 AM       Radiology Dg Chest 2 View  Result Date: 12/24/2017 CLINICAL DATA:  42 year old male with chest pain. EXAM: CHEST  2 VIEW COMPARISON:  None. FINDINGS: The heart size and mediastinal contours are within normal limits. Both lungs are clear. The  visualized skeletal structures are unremarkable. IMPRESSION: No active cardiopulmonary disease. Electronically Signed   By: Anner Crete M.D.   On: 12/24/2017 02:36    Procedures Procedures (including critical care time)  Medications Ordered in ED Medications -  No data to display   Initial Impression / Assessment and Plan / ED Course  I have reviewed the triage vital signs and the nursing notes.  Pertinent labs & imaging results that were available during my care of the patient were reviewed by me and considered in my medical decision making (see chart for details).     Patient had refused to have his blood drawn however I talked to the patient.  When he saw all the different blood tubes he thought he would get stuck for each 1.  I explained to him that he would only get one needle stick and they would get the multiple tubes.  I also talked to him it would be able to tell us if he has diabetes or any other medical problems.  I also waited until 2 AM to draw his blood so that would be a 4-1/2-hour troponin blood level so he would not have to have it done twice, I am not sure he would let us draw his blood again.  We discussed that his EKG was normal.  Recheck at 2:45 AM patient has not had any more chest pain since he has been to the emergency department.  We discussed his laboratory test and chest x-ray which were normal.  We discussed he could take Motrin or Tylenol if needed for chest pain if it returns.  Patient is requesting an inhaler.  He states he was given one in the fall and it helped with his breathing.  He denies having any wheezing tonight.  Final Clinical Impressions(s) / ED Diagnoses   Final diagnoses:  Atypical chest pain    ED Discharge Orders        Ordered    albuterol (PROVENTIL HFA;VENTOLIN HFA) 108 (90 Base) MCG/ACT inhaler  Every 4 hours PRN     12/24/17 0253      Plan discharge  Rolland Porter, MD, Barbette Or, MD 12/24/17 [phone]

## 2017-12-24 NOTE — Discharge Instructions (Signed)
Your tests tonight were all normal.  If you get the chest pain again you can take Motrin 400 mg and/or acetaminophen 650 mg every 6 hours as needed.  Use the inhaler if you get wheezing or shortness of breath.  Return to the emergency department if your symptoms seem worse.

## 2017-12-25 ENCOUNTER — Encounter: Payer: Self-pay | Admitting: Orthopaedic Surgery

## 2017-12-25 DIAGNOSIS — Z5181 Encounter for therapeutic drug level monitoring: Secondary | ICD-10-CM | POA: Diagnosis not present

## 2017-12-27 DIAGNOSIS — Z5181 Encounter for therapeutic drug level monitoring: Secondary | ICD-10-CM | POA: Diagnosis not present

## 2018-01-01 DIAGNOSIS — Z5181 Encounter for therapeutic drug level monitoring: Secondary | ICD-10-CM | POA: Diagnosis not present

## 2018-01-03 DIAGNOSIS — Z5181 Encounter for therapeutic drug level monitoring: Secondary | ICD-10-CM | POA: Diagnosis not present

## 2018-01-09 DIAGNOSIS — Z5181 Encounter for therapeutic drug level monitoring: Secondary | ICD-10-CM | POA: Diagnosis not present

## 2018-01-11 DIAGNOSIS — Z5181 Encounter for therapeutic drug level monitoring: Secondary | ICD-10-CM | POA: Diagnosis not present

## 2018-01-17 ENCOUNTER — Other Ambulatory Visit: Payer: Self-pay | Admitting: Orthopaedic Surgery

## 2018-01-17 DIAGNOSIS — Z5181 Encounter for therapeutic drug level monitoring: Secondary | ICD-10-CM | POA: Diagnosis not present

## 2018-01-17 MED ORDER — HYDROCODONE-ACETAMINOPHEN 5-325 MG PO TABS: ORAL_TABLET | ORAL | 0 refills | Status: DC

## 2018-01-17 NOTE — Telephone Encounter (Signed)
Patient of Dr. Brooke Bonito request refill on Hydrocodone/Acetaminophen (Norcdo)  5-325  Mgs.   Qty  30       Sig: One tablet by mouth every six hours as needed for pain. Limit 30 days.          Patient states [facility] uses [facility] [address]. In [place]

## 2018-01-19 DIAGNOSIS — Z5181 Encounter for therapeutic drug level monitoring: Secondary | ICD-10-CM | POA: Diagnosis not present

## 2018-01-22 DIAGNOSIS — Z5181 Encounter for therapeutic drug level monitoring: Secondary | ICD-10-CM | POA: Diagnosis not present

## 2018-01-24 DIAGNOSIS — Z5181 Encounter for therapeutic drug level monitoring: Secondary | ICD-10-CM | POA: Diagnosis not present

## 2018-02-14 ENCOUNTER — Other Ambulatory Visit: Payer: Self-pay | Admitting: Orthopaedic Surgery

## 2018-02-14 MED ORDER — HYDROCODONE-ACETAMINOPHEN 5-325 MG PO TABS: ORAL_TABLET | ORAL | 0 refills | Status: DC

## 2018-02-14 NOTE — Telephone Encounter (Signed)
Hydrocodone-Acetaminophen  5/325 mg  Qty  30 Tablets  One tablet by mouth every six hours as needed for pain. Limit 30 days.  PATIENT USES [facility] ON [address]

## 2018-03-06 ENCOUNTER — Other Ambulatory Visit (HOSPITAL_COMMUNITY): Payer: Self-pay | Admitting: Internal Medicine

## 2018-03-06 DIAGNOSIS — F17210 Nicotine dependence, cigarettes, uncomplicated: Secondary | ICD-10-CM | POA: Diagnosis not present

## 2018-03-06 DIAGNOSIS — M549 Dorsalgia, unspecified: Secondary | ICD-10-CM | POA: Diagnosis not present

## 2018-03-06 DIAGNOSIS — E049 Nontoxic goiter, unspecified: Secondary | ICD-10-CM

## 2018-03-06 DIAGNOSIS — Z0000 Encounter for general adult medical examination without abnormal findings: Secondary | ICD-10-CM | POA: Diagnosis not present

## 2018-03-06 DIAGNOSIS — E039 Hypothyroidism, unspecified: Secondary | ICD-10-CM | POA: Diagnosis not present

## 2018-03-06 DIAGNOSIS — R7989 Other specified abnormal findings of blood chemistry: Secondary | ICD-10-CM | POA: Diagnosis not present

## 2018-03-06 DIAGNOSIS — F17200 Nicotine dependence, unspecified, uncomplicated: Secondary | ICD-10-CM | POA: Diagnosis not present

## 2018-03-09 ENCOUNTER — Ambulatory Visit (HOSPITAL_COMMUNITY)
Admission: RE | Admit: 2018-03-09 | Discharge: 2018-03-09 | Disposition: A | Discharge status: Home / Self Care | Payer: Medicare Other | Source: Ambulatory Visit | Attending: Internal Medicine | Admitting: Internal Medicine

## 2018-03-09 DIAGNOSIS — E0789 Other specified disorders of thyroid: Secondary | ICD-10-CM | POA: Diagnosis not present

## 2018-03-09 DIAGNOSIS — E049 Nontoxic goiter, unspecified: Secondary | ICD-10-CM | POA: Diagnosis not present

## 2018-03-20 ENCOUNTER — Encounter: Payer: Self-pay | Admitting: Orthopaedic Surgery

## 2018-03-20 ENCOUNTER — Ambulatory Visit (INDEPENDENT_AMBULATORY_CARE_PROVIDER_SITE_OTHER): Payer: Medicare Other | Admitting: Orthopaedic Surgery

## 2018-03-20 VITALS — BP 109/66 | HR 102 | Ht 66.0 in | Wt 118.0 lb

## 2018-03-20 DIAGNOSIS — G8929 Other chronic pain: Secondary | ICD-10-CM

## 2018-03-20 DIAGNOSIS — M25511 Pain in right shoulder: Secondary | ICD-10-CM

## 2018-03-20 MED ORDER — HYDROCODONE-ACETAMINOPHEN 5-325 MG PO TABS: ORAL_TABLET | ORAL | 0 refills | Status: DC

## 2018-03-20 NOTE — Progress Notes (Signed)
Patient Aaron Spence, male DOB:April 03, 1976, 42 y.o. BOF:[id number]  Chief Complaint  Patient presents with  . Follow-up    CHRONIC RIGHT SHOULDER PAIN    HPI  Aaron Spence is a 42 y.o. male who has chronic right shoulder pain.  He has had increased pain since he has been working in his yard.  He has no numbness, no redness.  He is taking his medicine. HPI  Body mass index is 19.05 kg/m.  ROS  Review of Systems  Constitutional:       Patient does not have Diabetes Mellitus. Patient has hypertension. Patient has COPD or shortness of breath. Patient does not have BMI > 35. Patient has current smoking history.  HENT: Negative for congestion.   Respiratory: Positive for cough and shortness of breath.   Cardiovascular: Negative for chest pain.  Endocrine: Positive for cold intolerance.  Musculoskeletal: Positive for arthralgias.  Allergic/Immunologic: Negative for environmental allergies.  All other systems reviewed and are negative.   Past Medical History:  Diagnosis Date  . Bronchitis   . Cancer ([place])   . Tobacco use     History reviewed. No pertinent surgical history.  Family History  Problem Relation Age of Onset  . Bronchiolitis Unknown     Social History Social History   Tobacco Use  . Smoking status: Current Every Day Smoker    Packs/day: 1.50    Years: 20.00    Pack years: 30.00    Types: Cigarettes  . Smokeless tobacco: Never Used  Substance Use Topics  . Alcohol use: No    Frequency: Never    Comment: occassional  . Drug use: No    No Known Allergies  Current Outpatient Medications  Medication Sig Dispense Refill  . albuterol (PROVENTIL HFA;VENTOLIN HFA) 108 (90 Base) MCG/ACT inhaler Inhale 2 puffs into the lungs every 4 (four) hours as needed. 6.7 g 0  . diclofenac (VOLTAREN) 75 MG EC tablet Take 1 tablet (75 mg total) by mouth 2 (two) times daily with a meal. 60 tablet 2  . HYDROcodone-acetaminophen (NORCO/VICODIN) 5-325 MG tablet One  tablet by mouth every six hours as needed for pain.  Limit 30 days. 30 tablet 0  . sodium chloride (OCEAN) 0.65 % SOLN nasal spray Place 1 spray into both nostrils as needed for congestion. 30 mL 0   No current facility-administered medications for this visit.      Physical Exam  Blood pressure 109/66, pulse (!) 102, height 5\' 6"  (1.676 m), weight 118 lb (53.5 kg).  Constitutional: overall normal hygiene, normal nutrition, well developed, normal grooming, normal body habitus. Assistive device:none  Musculoskeletal: gait and station Limp none, muscle tone and strength are normal, no tremors or atrophy is present.  .  Neurological: coordination overall normal.  Deep tendon reflex/nerve stretch intact.  Sensation normal.  Cranial nerves II-XII intact.   Skin:   Normal overall no scars, lesions, ulcers or rashes. No psoriasis.  Psychiatric: Alert and oriented x 3.  Recent memory intact, remote memory unclear.  Normal mood and affect. Well groomed.  Good eye contact.  Cardiovascular: overall no swelling, no varicosities, no edema bilaterally, normal temperatures of the legs and arms, no clubbing, cyanosis and good capillary refill.  Lymphatic: palpation is normal.  Right shoulder with decreased motion.  Forward 145, abduction 145, internal 35, external 35, extension 10, adduction 40.  NV intact.  Grips are normal.  All other systems reviewed and are negative   The patient has been educated about  the nature of the problem(s) and counseled on treatment options.  The patient appeared to understand what I have discussed and is in agreement with it.  Encounter Diagnosis  Name Primary?  . Chronic right shoulder pain Yes    PLAN Call if any problems.  Precautions discussed.  Continue current medications.   Return to clinic 3 months   I have reviewed the [place] web site prior to prescribing narcotic medicine for this  patient.  Electronically Signed Sanjuana Kava, MD 5/21/20193:03 PM

## 2018-03-20 NOTE — Patient Instructions (Signed)
Steps to Quit Smoking Smoking tobacco can be bad for your health. It can also affect almost every organ in your body. Smoking puts you and people around you at risk for many serious Aaron Spence-lasting (chronic) diseases. Quitting smoking is hard, but it is one of the best things that you can do for your health. It is never too late to quit. What are the benefits of quitting smoking? When you quit smoking, you lower your risk for getting serious diseases and conditions. They can include:  Lung cancer or lung disease.  Heart disease.  Stroke.  Heart attack.  Not being able to have children (infertility).  Weak bones (osteoporosis) and broken bones (fractures).  If you have coughing, wheezing, and shortness of breath, those symptoms may get better when you quit. You may also get sick less often. If you are pregnant, quitting smoking can help to lower your chances of having a baby of low birth weight. What can I do to help me quit smoking? Talk with your doctor about what can help you quit smoking. Some things you can do (strategies) include:  Quitting smoking totally, instead of slowly cutting back how much you smoke over a period of time.  Going to in-person counseling. You are more likely to quit if you go to many counseling sessions.  Using resources and support systems, such as: ? Online chats with a counselor. ? Phone quitlines. ? Printed self-help materials. ? Support groups or group counseling. ? Text messaging programs. ? Mobile phone apps or applications.  Taking medicines. Some of these medicines may have nicotine in them. If you are pregnant or breastfeeding, do not take any medicines to quit smoking unless your doctor says it is okay. Talk with your doctor about counseling or other things that can help you.  Talk with your doctor about using more than one strategy at the same time, such as taking medicines while you are also going to in-person counseling. This can help make  quitting easier. What things can I do to make it easier to quit? Quitting smoking might feel very hard at first, but there is a lot that you can do to make it easier. Take these steps:  Talk to your family and friends. Ask them to support and encourage you.  Call phone quitlines, reach out to support groups, or work with a counselor.  Ask people who smoke to not smoke around you.  Avoid places that make you want (trigger) to smoke, such as: ? Bars. ? Parties. ? Smoke-break areas at work.  Spend time with people who do not smoke.  Lower the stress in your life. Stress can make you want to smoke. Try these things to help your stress: ? Getting regular exercise. ? Deep-breathing exercises. ? Yoga. ? Meditating. ? Doing a body scan. To do this, close your eyes, focus on one area of your body at a time from head to toe, and notice which parts of your body are tense. Try to relax the muscles in those areas.  Download or buy apps on your mobile phone or tablet that can help you stick to your quit plan. There are many free apps, such as QuitGuide from the CDC (Centers for Disease Control and Prevention). You can find more support from smokefree.gov and other websites.  This information is not intended to replace advice given to you by your health care provider. Make sure you discuss any questions you have with your health care provider. Document Released: 08/13/2009 Document   Revised: 06/14/2016 Document Reviewed: 03/03/2015 Elsevier Interactive Patient Education  2018 Elsevier Inc.  

## 2018-03-22 ENCOUNTER — Ambulatory Visit: Payer: Self-pay | Admitting: Orthopaedic Surgery

## 2018-04-18 ENCOUNTER — Telehealth: Payer: Self-pay | Admitting: Orthopaedic Surgery

## 2018-04-18 NOTE — Telephone Encounter (Signed)
Hydrocodone-Acetaminophen  5/325 mg  Qty 30 Tablets ° °PATIENT USES [facility] [address] °

## 2018-04-19 MED ORDER — HYDROCODONE-ACETAMINOPHEN 5-325 MG PO TABS: ORAL_TABLET | ORAL | 0 refills | Status: DC

## 2018-05-14 ENCOUNTER — Telehealth: Payer: Self-pay | Admitting: Orthopaedic Surgery

## 2018-05-14 NOTE — Telephone Encounter (Signed)
Patient requests refill:  HYDROcodone-acetaminophen (NORCO/VICODIN) 5-325 MG tablet 25 tablet  [facility], [place]  - relayed appears to be early request

## 2018-05-15 MED ORDER — HYDROCODONE-ACETAMINOPHEN 5-325 MG PO TABS: ORAL_TABLET | ORAL | 0 refills | Status: DC

## 2018-06-19 ENCOUNTER — Encounter: Payer: Self-pay | Admitting: Orthopaedic Surgery

## 2018-06-19 ENCOUNTER — Ambulatory Visit (INDEPENDENT_AMBULATORY_CARE_PROVIDER_SITE_OTHER): Payer: Medicare Other | Admitting: Orthopaedic Surgery

## 2018-06-19 ENCOUNTER — Ambulatory Visit (INDEPENDENT_AMBULATORY_CARE_PROVIDER_SITE_OTHER): Payer: Medicare Other

## 2018-06-19 VITALS — BP 101/69 | HR 88 | Temp 98.1°F | Ht 66.0 in | Wt 122.0 lb

## 2018-06-19 DIAGNOSIS — F17210 Nicotine dependence, cigarettes, uncomplicated: Secondary | ICD-10-CM | POA: Diagnosis not present

## 2018-06-19 DIAGNOSIS — M25562 Pain in left knee: Secondary | ICD-10-CM

## 2018-06-19 DIAGNOSIS — G8929 Other chronic pain: Secondary | ICD-10-CM

## 2018-06-19 DIAGNOSIS — M25511 Pain in right shoulder: Secondary | ICD-10-CM | POA: Diagnosis not present

## 2018-06-19 MED ORDER — HYDROCODONE-ACETAMINOPHEN 5-325 MG PO TABS: ORAL_TABLET | ORAL | 0 refills | Status: DC

## 2018-06-19 NOTE — Progress Notes (Signed)
Patient Aaron Spence, male DOB:11/20/1975, 42 y.o. NAT:[id number]  Chief Complaint  Patient presents with  . Shoulder Pain    Recheck on right shoulder pain.    HPI  Aaron Spence is a 42 y.o. male who has chronic pain of the right shoulder.  He has popping at times and swelling at times.  He has pain with overhead use.  He has no new trauma.  He has developed pain in the last week of the left knee.  It is more medially.  It is tender but does not give way and has not swollen.  He has no redness.   Body mass index is 19.69 kg/m.  ROS  Review of Systems  Constitutional:       Patient does not have Diabetes Mellitus. Patient has hypertension. Patient has COPD or shortness of breath. Patient does not have BMI > 35. Patient has current smoking history.  HENT: Negative for congestion.   Respiratory: Positive for cough and shortness of breath.   Cardiovascular: Negative for chest pain.  Endocrine: Positive for cold intolerance.  Musculoskeletal: Positive for arthralgias.  Allergic/Immunologic: Negative for environmental allergies.  All other systems reviewed and are negative.   All other systems reviewed and are negative.  The following is a summary of the past history medically, past history surgically, known current medicines, social history and family history.  This information is gathered electronically by the computer from prior information and documentation.  I review this each visit and have found including this information at this point in the chart is beneficial and informative.    Past Medical History:  Diagnosis Date  . Bronchitis   . Cancer ([place])   . Tobacco use     No past surgical history on file.  Family History  Problem Relation Age of Onset  . Bronchiolitis Unknown     Social History Social History   Tobacco Use  . Smoking status: Current Every Day Smoker    Packs/day: 1.50    Years: 20.00    Pack years: 30.00    Types: Cigarettes  .  Smokeless tobacco: Never Used  Substance Use Topics  . Alcohol use: No    Frequency: Never    Comment: occassional  . Drug use: No    No Known Allergies  Current Outpatient Medications  Medication Sig Dispense Refill  . albuterol (PROVENTIL HFA;VENTOLIN HFA) 108 (90 Base) MCG/ACT inhaler Inhale 2 puffs into the lungs every 4 (four) hours as needed. 6.7 g 0  . diclofenac (VOLTAREN) 75 MG EC tablet Take 1 tablet (75 mg total) by mouth 2 (two) times daily with a meal. 60 tablet 2  . HYDROcodone-acetaminophen (NORCO/VICODIN) 5-325 MG tablet One tablet by mouth every six hours as needed for pain.  Limit 30 days. 25 tablet 0  . sodium chloride (OCEAN) 0.65 % SOLN nasal spray Place 1 spray into both nostrils as needed for congestion. 30 mL 0   No current facility-administered medications for this visit.      Physical Exam  Blood pressure 101/69, pulse 88, temperature 98.1 F (36.7 C), height 5\' 6"  (1.676 m), weight 122 lb (55.3 kg).  Constitutional: overall normal hygiene, normal nutrition, well developed, normal grooming, normal body habitus. Assistive device:none  Musculoskeletal: gait and station Limp left, muscle tone and strength are normal, no tremors or atrophy is present.  .  Neurological: coordination overall normal.  Deep tendon reflex/nerve stretch intact.  Sensation normal.  Cranial nerves II-XII intact.   Skin:  Normal overall no scars, lesions, ulcers or rashes. No psoriasis.  Psychiatric: Alert and oriented x 3.  Recent memory intact, remote memory unclear.  Normal mood and affect. Well groomed.  Good eye contact.  Cardiovascular: overall no swelling, no varicosities, no edema bilaterally, normal temperatures of the legs and arms, no clubbing, cyanosis and good capillary refill.  Lymphatic: palpation is normal.  Right shoulder has forward flexion 165, abduction 150, internal 35, external 35, extension 15 and adduction full.  NV intact.  He has no effusion.  His  left knee has tenderness medially, ROM 0 to 115, slight limp left, NV intact, knee is stable, no effusion.  All other systems reviewed and are negative   The patient has been educated about the nature of the problem(s) and counseled on treatment options.  The patient appeared to understand what I have discussed and is in agreement with it.  Encounter Diagnoses  Name Primary?  . Acute pain of left knee Yes  . Chronic right shoulder pain   . Cigarette nicotine dependence without complication    X-rays were done of the left knee, reported separately.  PLAN Call if any problems.  Precautions discussed.  Continue current medications.   Return to clinic 3 months   I have reviewed the [place] web site prior to prescribing narcotic medicine for this patient.  The patient has read and signed an Opioid Treatment Agreement which has been scanned and added to the medical record.  The patient understands the agreement and agrees to abide with it.  The patient has chronic pain that is being treated with an opioid which relieves the pain.  The patient understands potential complications with chronic opioid treatment.   Electronically Signed Sanjuana Kava, MD 8/20/20193:21 PM

## 2018-07-19 ENCOUNTER — Telehealth: Payer: Self-pay | Admitting: Orthopaedic Surgery

## 2018-07-19 MED ORDER — HYDROCODONE-ACETAMINOPHEN 5-325 MG PO TABS: ORAL_TABLET | ORAL | 0 refills | Status: DC

## 2018-07-19 NOTE — Telephone Encounter (Signed)
Hydrocodone-Acetaminophen 5/325 mg  Qty 25 Tablets  PATIENT USES [facility] [address]

## 2018-08-20 ENCOUNTER — Other Ambulatory Visit: Payer: Self-pay | Admitting: Orthopaedic Surgery

## 2018-08-20 MED ORDER — HYDROCODONE-ACETAMINOPHEN 5-325 MG PO TABS: ORAL_TABLET | ORAL | 0 refills | Status: DC

## 2018-08-20 NOTE — Telephone Encounter (Signed)
Dr. Brooke Bonito patient  Hydrocodone-Acetaminophen  5/325 mg  Qty  25 Tablets  One tablet by mouth every six hours as needed for pain. Limit 30 days.  PATIENT USES [facility] [address]

## 2018-09-18 ENCOUNTER — Ambulatory Visit (INDEPENDENT_AMBULATORY_CARE_PROVIDER_SITE_OTHER): Payer: Medicare Other | Admitting: Orthopaedic Surgery

## 2018-09-18 ENCOUNTER — Encounter: Payer: Self-pay | Admitting: Orthopaedic Surgery

## 2018-09-18 VITALS — BP 113/61 | HR 94 | Ht 66.0 in | Wt 125.0 lb

## 2018-09-18 DIAGNOSIS — G8929 Other chronic pain: Secondary | ICD-10-CM | POA: Diagnosis not present

## 2018-09-18 DIAGNOSIS — F17210 Nicotine dependence, cigarettes, uncomplicated: Secondary | ICD-10-CM

## 2018-09-18 DIAGNOSIS — M25511 Pain in right shoulder: Secondary | ICD-10-CM | POA: Diagnosis not present

## 2018-09-18 MED ORDER — HYDROCODONE-ACETAMINOPHEN 5-325 MG PO TABS: ORAL_TABLET | ORAL | 0 refills | Status: DC

## 2018-09-18 NOTE — Progress Notes (Signed)
Patient Aaron Spence, male DOB:[birth year], 42 y.o. XKG:[id number]  Chief Complaint  Patient presents with  . Shoulder Pain    Right shoulder    HPI  Aaron Spence is a 42 y.o. male who has chronic right shoulder pain.  He has no new trauma, no paresthesias, no swelling.  He has pain with overhead use.  He has been doing his exercises.  He has no neck apin.   Body mass index is 20.18 kg/m.  ROS  Review of Systems  Constitutional:       Patient does not have Diabetes Mellitus. Patient has hypertension. Patient has COPD or shortness of breath. Patient does not have BMI > 35. Patient has current smoking history.  HENT: Negative for congestion.   Respiratory: Positive for cough and shortness of breath.   Cardiovascular: Negative for chest pain.  Endocrine: Positive for cold intolerance.  Musculoskeletal: Positive for arthralgias.  Allergic/Immunologic: Negative for environmental allergies.  All other systems reviewed and are negative.   All other systems reviewed and are negative.  The following is a summary of the past history medically, past history surgically, known current medicines, social history and family history.  This information is gathered electronically by the computer from prior information and documentation.  I review this each visit and have found including this information at this point in the chart is beneficial and informative.    Past Medical History:  Diagnosis Date  . Bronchitis   . Cancer ([place])   . Tobacco use     History reviewed. No pertinent surgical history.  Family History  Problem Relation Age of Onset  . Bronchiolitis Unknown     Social History Social History   Tobacco Use  . Smoking status: Current Every Day Smoker    Packs/day: 1.50    Years: 20.00    Pack years: 30.00    Types: Cigarettes  . Smokeless tobacco: Never Used  Substance Use Topics  . Alcohol use: No    Frequency: Never    Comment: occassional  . Drug use:  No    No Known Allergies  Current Outpatient Medications  Medication Sig Dispense Refill  . albuterol (PROVENTIL HFA;VENTOLIN HFA) 108 (90 Base) MCG/ACT inhaler Inhale 2 puffs into the lungs every 4 (four) hours as needed. 6.7 g 0  . diclofenac (VOLTAREN) 75 MG EC tablet Take 1 tablet (75 mg total) by mouth 2 (two) times daily with a meal. 60 tablet 2  . HYDROcodone-acetaminophen (NORCO/VICODIN) 5-325 MG tablet One tablet by mouth every six hours as needed for pain.  Limit 30 days. 25 tablet 0  . sodium chloride (OCEAN) 0.65 % SOLN nasal spray Place 1 spray into both nostrils as needed for congestion. 30 mL 0   No current facility-administered medications for this visit.      Physical Exam  Blood pressure 113/61, pulse 94, height 5\' 6"  (1.676 m), weight 125 lb (56.7 kg).  Constitutional: overall normal hygiene, normal nutrition, well developed, normal grooming, normal body habitus. Assistive device:none  Musculoskeletal: gait and station Limp none, muscle tone and strength are normal, no tremors or atrophy is present.  .  Neurological: coordination overall normal.  Deep tendon reflex/nerve stretch intact.  Sensation normal.  Cranial nerves II-XII intact.   Skin:   Normal overall no scars, lesions, ulcers or rashes. No psoriasis.  Psychiatric: Alert and oriented x 3.  Recent memory intact, remote memory unclear.  Normal mood and affect. Well groomed.  Good eye contact.  Cardiovascular:  overall no swelling, no varicosities, no edema bilaterally, normal temperatures of the legs and arms, no clubbing, cyanosis and good capillary refill.  Lymphatic: palpation is normal.  Examination of right Upper Extremity is done.  Inspection:   Overall:  Elbow non-tender without crepitus or defects, forearm non-tender without crepitus or defects, wrist non-tender without crepitus or defects, hand non-tender.    Shoulder: with glenohumeral joint tenderness, without effusion.   Upper arm: without  swelling and tenderness   Range of motion:   Overall:  Full range of motion of the elbow, full range of motion of wrist and full range of motion in fingers.   Shoulder:  right  165 degrees forward flexion; 150 degrees abduction; 35 degrees internal rotation, 35 degrees external rotation, 15 degrees extension, 40 degrees adduction.   Stability:   Overall:  Shoulder, elbow and wrist stable   Strength and Tone:   Overall full shoulder muscles strength, full upper arm strength and normal upper arm bulk and tone.  All other systems reviewed and are negative   The patient has been educated about the nature of the problem(s) and counseled on treatment options.  The patient appeared to understand what I have discussed and is in agreement with it.  Encounter Diagnoses  Name Primary?  . Chronic right shoulder pain Yes  . Cigarette nicotine dependence without complication     PLAN Call if any problems.  Precautions discussed.  Continue current medications.   Return to clinic 3 months   The patient has read and signed an Opioid Treatment Agreement which has been scanned and added to the medical record.  The patient understands the agreement and agrees to abide with it.  The patient has chronic pain that is being treated with an opioid which relieves the pain.  The patient understands potential complications with chronic opioid treatment.   I have reviewed the [place] web site prior to prescribing narcotic medicine for this patient.   Electronically Signed Sanjuana Kava, MD 11/19/20193:11 PM

## 2018-10-18 ENCOUNTER — Telehealth: Payer: Self-pay | Admitting: Orthopaedic Surgery

## 2018-10-18 NOTE — Telephone Encounter (Signed)
Patient called at 2:00 this afternoon to request refill on his medication.  I explained that Dr Luna Glasgow was out of the office until 10/30/18.  He requests Hydrocodone/Acetaminophen 5-325 mgs.  Qty  25  Sig: One tablet by mouth every six hours as needed for pain. Limit 30 days.  [facility] [address].

## 2018-10-30 MED ORDER — HYDROCODONE-ACETAMINOPHEN 5-325 MG PO TABS: ORAL_TABLET | ORAL | 0 refills | Status: DC

## 2018-11-29 ENCOUNTER — Telehealth: Payer: Self-pay | Admitting: Orthopaedic Surgery

## 2018-11-29 NOTE — Telephone Encounter (Signed)
Patient requests a refill on Hydrocodone/Acetaminophen 5-325  Mgs.   Qty  25  Sig: One tablet by mouth every six hours as needed for pain. Limit 30 days.  Patient uses [facility] on [address]

## 2018-12-03 ENCOUNTER — Telehealth: Payer: Self-pay | Admitting: Orthopaedic Surgery

## 2018-12-03 MED ORDER — HYDROCODONE-ACETAMINOPHEN 5-325 MG PO TABS: ORAL_TABLET | ORAL | 0 refills | Status: DC

## 2018-12-03 NOTE — Telephone Encounter (Signed)
[name] from [facility] called and she said accidentally hit delete on the prescription for Aaron Spence.  She asks that you send it in again.  Patient requests a refill on Hydrocodone/Acetaminophen 5-325  Mgs.   Qty  25  Sig: One tablet by mouth every six hours as needed for pain. Limit 30 days.  Patient uses [facility] on [address]

## 2018-12-04 MED ORDER — HYDROCODONE-ACETAMINOPHEN 5-325 MG PO TABS: ORAL_TABLET | ORAL | 0 refills | Status: DC

## 2018-12-12 ENCOUNTER — Encounter (HOSPITAL_COMMUNITY): Payer: Self-pay | Admitting: Emergency Medicine

## 2018-12-12 ENCOUNTER — Emergency Department (HOSPITAL_COMMUNITY): Payer: Medicare Other

## 2018-12-12 ENCOUNTER — Ambulatory Visit (INDEPENDENT_AMBULATORY_CARE_PROVIDER_SITE_OTHER): Payer: Medicare Other | Admitting: Orthopaedic Surgery

## 2018-12-12 ENCOUNTER — Encounter: Payer: Self-pay | Admitting: Orthopaedic Surgery

## 2018-12-12 ENCOUNTER — Emergency Department (HOSPITAL_COMMUNITY)
Admission: EM | Admit: 2018-12-12 | Discharge: 2018-12-12 | Disposition: A | Discharge status: Home / Self Care | Payer: Medicare Other | Attending: Emergency Medicine | Admitting: Emergency Medicine

## 2018-12-12 ENCOUNTER — Other Ambulatory Visit: Payer: Self-pay

## 2018-12-12 VITALS — BP 104/66 | HR 115 | Ht 66.0 in | Wt 125.0 lb

## 2018-12-12 DIAGNOSIS — G8929 Other chronic pain: Secondary | ICD-10-CM | POA: Diagnosis not present

## 2018-12-12 DIAGNOSIS — F17210 Nicotine dependence, cigarettes, uncomplicated: Secondary | ICD-10-CM

## 2018-12-12 DIAGNOSIS — M25511 Pain in right shoulder: Secondary | ICD-10-CM | POA: Diagnosis not present

## 2018-12-12 DIAGNOSIS — R05 Cough: Secondary | ICD-10-CM | POA: Diagnosis not present

## 2018-12-12 DIAGNOSIS — J069 Acute upper respiratory infection, unspecified: Secondary | ICD-10-CM | POA: Diagnosis not present

## 2018-12-12 DIAGNOSIS — Z859 Personal history of malignant neoplasm, unspecified: Secondary | ICD-10-CM | POA: Diagnosis not present

## 2018-12-12 DIAGNOSIS — Z79899 Other long term (current) drug therapy: Secondary | ICD-10-CM | POA: Insufficient documentation

## 2018-12-12 DIAGNOSIS — B9789 Other viral agents as the cause of diseases classified elsewhere: Secondary | ICD-10-CM

## 2018-12-12 MED ORDER — BENZONATATE 100 MG PO CAPS: 200.0000 mg | ORAL_CAPSULE | Freq: Three times a day (TID) | ORAL | 0 refills | Status: DC | PRN

## 2018-12-12 MED ORDER — BENZONATATE 100 MG PO CAPS
200.0000 mg | ORAL_CAPSULE | Freq: Once | ORAL | Status: AC
  Administered 2018-12-12: 200 mg via ORAL
  Filled 2018-12-12: qty 2

## 2018-12-12 NOTE — ED Triage Notes (Signed)
PT c/o productive cough over the past few days with no fever. PT states he thinks he had the flu this past Saturday-Tuesday but body aches and fever has subsided.

## 2018-12-12 NOTE — Discharge Instructions (Addendum)
Your exam is reassuring today that you are improving from this recent infection.  You may use the Tessalon if needed for continued cough symptom.  Rest and make sure you are drinking plenty fluids.

## 2018-12-12 NOTE — Progress Notes (Signed)
Patient [name], male DOB:1976-06-19, 43 y.o. JWJ:[id number]  Chief Complaint  Patient presents with  . Shoulder Pain    Right    HPI  Aaron Spence is a 43 y.o. male who has right shoulder pain chronically.  He has more pain with the cold weather we have had.  He has no new trauma, no paresthesias, no swelling.  He has more pain with overhead use.   Body mass index is 20.18 kg/m.  ROS  Review of Systems  Constitutional:       Patient does not have Diabetes Mellitus. Patient has hypertension. Patient has COPD or shortness of breath. Patient does not have BMI > 35. Patient has current smoking history.  HENT: Negative for congestion.   Respiratory: Positive for cough and shortness of breath.   Cardiovascular: Negative for chest pain.  Endocrine: Positive for cold intolerance.  Musculoskeletal: Positive for arthralgias.  Allergic/Immunologic: Negative for environmental allergies.  All other systems reviewed and are negative.   All other systems reviewed and are negative.  The following is a summary of the past history medically, past history surgically, known current medicines, social history and family history.  This information is gathered electronically by the computer from prior information and documentation.  I review this each visit and have found including this information at this point in the chart is beneficial and informative.    Past Medical History:  Diagnosis Date  . Bronchitis   . Cancer ([place])   . Tobacco use     History reviewed. No pertinent surgical history.  Family History  Problem Relation Age of Onset  . Bronchiolitis Unknown     Social History Social History   Tobacco Use  . Smoking status: Current Every Day Smoker    Packs/day: 1.50    Years: 20.00    Pack years: 30.00    Types: Cigarettes  . Smokeless tobacco: Never Used  Substance Use Topics  . Alcohol use: No    Frequency: Never    Comment: occassional  . Drug use: No     No Known Allergies  Current Outpatient Medications  Medication Sig Dispense Refill  . albuterol (PROVENTIL HFA;VENTOLIN HFA) 108 (90 Base) MCG/ACT inhaler Inhale 2 puffs into the lungs every 4 (four) hours as needed. 6.7 g 0  . diclofenac (VOLTAREN) 75 MG EC tablet Take 1 tablet (75 mg total) by mouth 2 (two) times daily with a meal. 60 tablet 2  . HYDROcodone-acetaminophen (NORCO/VICODIN) 5-325 MG tablet One tablet by mouth every six hours as needed for pain.  Limit 30 days. 22 tablet 0  . sodium chloride (OCEAN) 0.65 % SOLN nasal spray Place 1 spray into both nostrils as needed for congestion. 30 mL 0   No current facility-administered medications for this visit.      Physical Exam  Blood pressure 104/66, pulse (!) 115, height 5\' 6"  (1.676 m), weight 125 lb (56.7 kg).  Constitutional: overall normal hygiene, normal nutrition, well developed, normal grooming, normal body habitus. Assistive device:none  Musculoskeletal: gait and station Limp none, muscle tone and strength are normal, no tremors or atrophy is present.  .  Neurological: coordination overall normal.  Deep tendon reflex/nerve stretch intact.  Sensation normal.  Cranial nerves II-XII intact.   Skin:   Normal overall no scars, lesions, ulcers or rashes. No psoriasis.  Psychiatric: Alert and oriented x 3.  Recent memory intact, remote memory unclear.  Normal mood and affect. Well groomed.  Good eye contact.  Cardiovascular:  overall no swelling, no varicosities, no edema bilaterally, normal temperatures of the legs and arms, no clubbing, cyanosis and good capillary refill.  Lymphatic: palpation is normal.  Examination of right Upper Extremity is done.  Inspection:   Overall:  Elbow non-tender without crepitus or defects, forearm non-tender without crepitus or defects, wrist non-tender without crepitus or defects, hand non-tender.    Shoulder: with glenohumeral joint tenderness, without effusion.   Upper arm: without  swelling and tenderness   Range of motion:   Overall:  Full range of motion of the elbow, full range of motion of wrist and full range of motion in fingers.   Shoulder:  right  165 degrees forward flexion; 150 degrees abduction; 35 degrees internal rotation, 35 degrees external rotation, 15 degrees extension, 40 degrees adduction.   Stability:   Overall:  Shoulder, elbow and wrist stable   Strength and Tone:   Overall full shoulder muscles strength, full upper arm strength and normal upper arm bulk and tone.  All other systems reviewed and are negative   The patient has been educated about the nature of the problem(s) and counseled on treatment options.  The patient appeared to understand what I have discussed and is in agreement with it.  Encounter Diagnoses  Name Primary?  . Chronic right shoulder pain Yes  . Cigarette nicotine dependence without complication     PLAN Call if any problems.  Precautions discussed.  Continue current medications.   Return to clinic 3 months  Electronically Signed Sanjuana Kava, MD 2/12/20209:45 AM

## 2018-12-12 NOTE — ED Provider Notes (Signed)
[facility] EMERGENCY DEPARTMENT Provider Note   CSN: [id number] Arrival date & time: 12/12/18  1741     History   Chief Complaint Chief Complaint  Patient presents with  . Cough    HPI Aaron Spence is a 43 y.o. male presenting with a 5-day history of flulike symptoms, stating he developed non-productive cough along with subjective fever, generalized body aches starting 5 days ago.  He states his symptoms lasted for about 3 days, and have improved since then but still has a low-grade lingering nonproductive cough.  He denies shortness of breath, sore throat, headache, neck pain or stiffness, and has had no nausea, vomiting or abdominal pain.  He had a positive exposure to the flu last week at work.  His motivation for today's visit was to see if this was perhaps the flu, even though he is feeling improved at this time.  The history is provided by the patient.    Past Medical History:  Diagnosis Date  . Bronchitis   . Cancer ([place])   . Tobacco use     Patient Active Problem List   Diagnosis Date Noted  . Right shoulder pain 12/08/2015  . Pain in joint, shoulder region 05/05/2014  . Muscle weakness (generalized) 05/05/2014    History reviewed. No pertinent surgical history.      Home Medications    Prior to Admission medications   Medication Sig Start Date End Date Taking? Authorizing Provider  albuterol (PROVENTIL HFA;VENTOLIN HFA) 108 (90 Base) MCG/ACT inhaler Inhale 2 puffs into the lungs every 4 (four) hours as needed. 12/24/17   Rolland Porter, MD  benzonatate (TESSALON) 100 MG capsule Take 2 capsules (200 mg total) by mouth 3 (three) times daily as needed. 12/12/18   Mikkel Charrette, Almyra Free, PA-C  diclofenac (VOLTAREN) 75 MG EC tablet Take 1 tablet (75 mg total) by mouth 2 (two) times daily with a meal. 08/24/17   Sanjuana Kava, MD  HYDROcodone-acetaminophen (NORCO/VICODIN) 5-325 MG tablet One tablet by mouth every six hours as needed for pain.  Limit 30 days. 12/04/18   Sanjuana Kava, MD  sodium chloride (OCEAN) 0.65 % SOLN nasal spray Place 1 spray into both nostrils as needed for congestion. 07/11/17   Frederica Kuster, PA-C    Family History Family History  Problem Relation Age of Onset  . Bronchiolitis Other     Social History Social History   Tobacco Use  . Smoking status: Current Every Day Smoker    Packs/day: 1.50    Years: 20.00    Pack years: 30.00    Types: Cigarettes  . Smokeless tobacco: Never Used  Substance Use Topics  . Alcohol use: No    Frequency: Never    Comment: occassional  . Drug use: No     Allergies   Patient has no known allergies.   Review of Systems Review of Systems  Constitutional: Positive for fever. Negative for chills.  HENT: Negative for congestion, ear pain, rhinorrhea, sinus pressure, sore throat, trouble swallowing and voice change.   Eyes: Negative for discharge.  Respiratory: Positive for cough. Negative for shortness of breath, wheezing and stridor.   Cardiovascular: Negative for chest pain.  Gastrointestinal: Negative for abdominal pain.  Genitourinary: Negative.   Musculoskeletal: Positive for myalgias.     Physical Exam Updated Vital Signs BP 127/68   Pulse (!) 107   Temp 98.6 F (37 C)   Resp 18   SpO2 98%   Physical Exam Vitals signs and nursing note  reviewed.  Constitutional:      Appearance: He is well-developed.  HENT:     Head: Normocephalic and atraumatic.     Right Ear: Tympanic membrane normal.     Left Ear: Tympanic membrane normal.     Nose: Nose normal.     Mouth/Throat:     Mouth: Mucous membranes are moist.  Eyes:     Conjunctiva/sclera: Conjunctivae normal.  Neck:     Musculoskeletal: Normal range of motion.  Cardiovascular:     Rate and Rhythm: Normal rate and regular rhythm.     Heart sounds: Normal heart sounds.  Pulmonary:     Effort: Pulmonary effort is normal.     Breath sounds: Normal breath sounds. No wheezing.  Musculoskeletal: Normal range of motion.    Skin:    General: Skin is warm and dry.  Neurological:     Mental Status: He is alert.      ED Treatments / Results  Labs (all labs ordered are listed, but only abnormal results are displayed) Labs Reviewed - No data to display  EKG None  Radiology Dg Chest 2 View  Result Date: 12/12/2018 CLINICAL DATA:  Productive cough. EXAM: CHEST - 2 VIEW COMPARISON:  10/30/2017. FINDINGS: The heart size and mediastinal contours are within normal limits. Both lungs are clear. The visualized skeletal structures are unremarkable. Mild hyperinflation. IMPRESSION: No active cardiopulmonary disease.  Mild hyperinflation. Electronically Signed   By: Staci Righter M.D.   On: 12/12/2018 19:50    Procedures Procedures (including critical care time)  Medications Ordered in ED Medications  benzonatate (TESSALON) capsule 200 mg (200 mg Oral Given 12/12/18 2032)     Initial Impression / Assessment and Plan / ED Course  I have reviewed the triage vital signs and the nursing notes.  Pertinent labs & imaging results that were available during my care of the patient were reviewed by me and considered in my medical decision making (see chart for details).     Patient with probable viral URI, possibly influenza, but improving with no significant exam findings today.  He does endorse continued dry cough, no shortness of breath.  He was placed on Tessalon.  PRN follow-up anticipated.  Final Clinical Impressions(s) / ED Diagnoses   Final diagnoses:  Viral URI with cough    ED Discharge Orders         Ordered    benzonatate (TESSALON) 100 MG capsule  3 times daily PRN     12/12/18 2022           Evalee Jefferson, PA-C 12/13/18 L[place]le [place], MD 12/13/18 1635
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
## 2019-01-01 ENCOUNTER — Telehealth: Payer: Self-pay | Admitting: Orthopaedic Surgery

## 2019-01-01 MED ORDER — HYDROCODONE-ACETAMINOPHEN 5-325 MG PO TABS: ORAL_TABLET | ORAL | 0 refills | Status: DC

## 2019-01-01 NOTE — Telephone Encounter (Signed)
Hydrocodone-Acetaminophen 5/325 mg  Qty  22 Tablets   PATIENT USES [facility] ON [address]

## 2019-01-31 ENCOUNTER — Telehealth: Payer: Self-pay | Admitting: Orthopaedic Surgery

## 2019-01-31 NOTE — Telephone Encounter (Signed)
Patient requests refill on Hydrocodone/Acetaminophen 5-325  Mgs.   Qty  22  Sig: One tablet by mouth every six hours as needed for pain. Limit 30 days.  [facility] uses [facility] [address].

## 2019-02-04 NOTE — Telephone Encounter (Signed)
No more narcotics. Take Tylenol, Advil or Aleve. 

## 2019-03-12 ENCOUNTER — Ambulatory Visit: Payer: Self-pay | Admitting: Orthopaedic Surgery

## 2019-03-13 ENCOUNTER — Ambulatory Visit: Payer: Medicare Other | Admitting: Orthopaedic Surgery

## 2019-03-14 ENCOUNTER — Ambulatory Visit: Payer: Medicare Other | Admitting: Orthopaedic Surgery

## 2019-03-19 ENCOUNTER — Other Ambulatory Visit: Payer: Self-pay

## 2019-03-19 ENCOUNTER — Ambulatory Visit (INDEPENDENT_AMBULATORY_CARE_PROVIDER_SITE_OTHER): Payer: Medicare Other | Admitting: Orthopaedic Surgery

## 2019-03-19 ENCOUNTER — Encounter: Payer: Self-pay | Admitting: Orthopaedic Surgery

## 2019-03-19 VITALS — BP 109/63 | HR 81 | Temp 97.3°F | Ht 66.0 in | Wt 133.0 lb

## 2019-03-19 DIAGNOSIS — G8929 Other chronic pain: Secondary | ICD-10-CM | POA: Diagnosis not present

## 2019-03-19 DIAGNOSIS — F17210 Nicotine dependence, cigarettes, uncomplicated: Secondary | ICD-10-CM

## 2019-03-19 DIAGNOSIS — M25511 Pain in right shoulder: Secondary | ICD-10-CM

## 2019-03-19 MED ORDER — HYDROCODONE-ACETAMINOPHEN 5-325 MG PO TABS: ORAL_TABLET | ORAL | 0 refills | Status: DC

## 2019-03-19 NOTE — Patient Instructions (Signed)
Steps to Quit Smoking    Smoking tobacco can be bad for your health. It can also affect almost every organ in your body. Smoking puts you and people around you at risk for many serious long-lasting (chronic) diseases. Quitting smoking is hard, but it is one of the best things that you can do for your health. It is never too late to quit.  What are the benefits of quitting smoking?  When you quit smoking, you lower your risk for getting serious diseases and conditions. They can include:  · Lung cancer or lung disease.  · Heart disease.  · Stroke.  · Heart attack.  · Not being able to have children (infertility).  · Weak bones (osteoporosis) and broken bones (fractures).  If you have coughing, wheezing, and shortness of breath, those symptoms may get better when you quit. You may also get sick less often. If you are pregnant, quitting smoking can help to lower your chances of having a baby of low birth weight.  What can I do to help me quit smoking?  Talk with your doctor about what can help you quit smoking. Some things you can do (strategies) include:  · Quitting smoking totally, instead of slowly cutting back how much you smoke over a period of time.  · Going to in-person counseling. You are more likely to quit if you go to many counseling sessions.  · Using resources and support systems, such as:  ? Online chats with a counselor.  ? Phone quitlines.  ? Printed self-help materials.  ? Support groups or group counseling.  ? Text messaging programs.  ? Mobile phone apps or applications.  · Taking medicines. Some of these medicines may have nicotine in them. If you are pregnant or breastfeeding, do not take any medicines to quit smoking unless your doctor says it is okay. Talk with your doctor about counseling or other things that can help you.  Talk with your doctor about using more than one strategy at the same time, such as taking medicines while you are also going to in-person counseling. This can help make  quitting easier.  What things can I do to make it easier to quit?  Quitting smoking might feel very hard at first, but there is a lot that you can do to make it easier. Take these steps:  · Talk to your family and friends. Ask them to support and encourage you.  · Call phone quitlines, reach out to support groups, or work with a counselor.  · Ask people who smoke to not smoke around you.  · Avoid places that make you want (trigger) to smoke, such as:  ? Bars.  ? Parties.  ? Smoke-break areas at work.  · Spend time with people who do not smoke.  · Lower the stress in your life. Stress can make you want to smoke. Try these things to help your stress:  ? Getting regular exercise.  ? Deep-breathing exercises.  ? Yoga.  ? Meditating.  ? Doing a body scan. To do this, close your eyes, focus on one area of your body at a time from head to toe, and notice which parts of your body are tense. Try to relax the muscles in those areas.  · Download or buy apps on your mobile phone or tablet that can help you stick to your quit plan. There are many free apps, such as QuitGuide from the CDC (Centers for Disease Control and Prevention). You can find more   support from smokefree.gov and other websites.  This information is not intended to replace advice given to you by your health care provider. Make sure you discuss any questions you have with your health care provider.  Document Released: 08/13/2009 Document Revised: 06/14/2016 Document Reviewed: 03/03/2015  Elsevier Interactive Patient Education © 2019 Elsevier Inc.

## 2019-03-19 NOTE — Progress Notes (Signed)
Patient Aaron Spence:PYKDX [name], male DOB:1976/08/05, 43 y.o. IPJ:[id number]  Chief Complaint  Patient presents with  . Shoulder Pain    right     HPI  Aaron Spence is a 43 y.o. male who has chronic right shoulder pain. It is worse with rainy weather like today.  He has no new trauma.  He has no numbness or swelling.  He is doing his exercises.   Body mass index is 21.47 kg/m.  ROS  Review of Systems  Constitutional:       Patient does not have Diabetes Mellitus. Patient has hypertension. Patient has COPD or shortness of breath. Patient does not have BMI > 35. Patient has current smoking history.  HENT: Negative for congestion.   Respiratory: Positive for cough and shortness of breath.   Cardiovascular: Negative for chest pain.  Endocrine: Positive for cold intolerance.  Musculoskeletal: Positive for arthralgias.  Allergic/Immunologic: Negative for environmental allergies.  All other systems reviewed and are negative.   All other systems reviewed and are negative.  The following is a summary of the past history medically, past history surgically, known current medicines, social history and family history.  This information is gathered electronically by the computer from prior information and documentation.  I review this each visit and have found including this information at this point in the chart is beneficial and informative.    Past Medical History:  Diagnosis Date  . Bronchitis   . Cancer ([place])   . Tobacco use     History reviewed. No pertinent surgical history.  Family History  Problem Relation Age of Onset  . Bronchiolitis Other     Social History Social History   Tobacco Use  . Smoking status: Current Every Day Smoker    Packs/day: 1.50    Years: 20.00    Pack years: 30.00    Types: Cigarettes  . Smokeless tobacco: Never Used  Substance Use Topics  . Alcohol use: No    Frequency: Never    Comment: occassional  . Drug use: No    No Known  Allergies  Current Outpatient Medications  Medication Sig Dispense Refill  . albuterol (PROVENTIL HFA;VENTOLIN HFA) 108 (90 Base) MCG/ACT inhaler Inhale 2 puffs into the lungs every 4 (four) hours as needed. 6.7 g 0  . benzonatate (TESSALON) 100 MG capsule Take 2 capsules (200 mg total) by mouth 3 (three) times daily as needed. 30 capsule 0  . HYDROcodone-acetaminophen (NORCO/VICODIN) 5-325 MG tablet One tablet by mouth every six hours as needed for pain.  Limit 30 days. 22 tablet 0  . sodium chloride (OCEAN) 0.65 % SOLN nasal spray Place 1 spray into both nostrils as needed for congestion. 30 mL 0  . diclofenac (VOLTAREN) 75 MG EC tablet Take 1 tablet (75 mg total) by mouth 2 (two) times daily with a meal. (Patient not taking: Reported on 03/19/2019) 60 tablet 2   No current facility-administered medications for this visit.      Physical Exam  Blood pressure 109/63, pulse 81, height 5\' 6"  (1.676 m), weight 133 lb (60.3 kg).  Constitutional: overall normal hygiene, normal nutrition, well developed, normal grooming, normal body habitus. Assistive device:none  Musculoskeletal: gait and station Limp none, muscle tone and strength are normal, no tremors or atrophy is present.  .  Neurological: coordination overall normal.  Deep tendon reflex/nerve stretch intact.  Sensation normal.  Cranial nerves II-XII intact.   Skin:   Normal overall no scars, lesions, ulcers or rashes. No psoriasis.  Psychiatric: Alert and oriented x 3.  Recent memory intact, remote memory unclear.  Normal mood and affect. Well groomed.  Good eye contact.  Cardiovascular: overall no swelling, no varicosities, no edema bilaterally, normal temperatures of the legs and arms, no clubbing, cyanosis and good capillary refill.  Lymphatic: palpation is normal.  Examination of right Upper Extremity is done.  Inspection:   Overall:  Elbow non-tender without crepitus or defects, forearm non-tender without crepitus or defects,  wrist non-tender without crepitus or defects, hand non-tender.    Shoulder: with glenohumeral joint tenderness, without effusion.   Upper arm:  without swelling and tenderness   Range of motion:   Overall:  Full range of motion of the elbow, full range of motion of wrist and full range of motion in fingers.   Shoulder:  right  160 degrees forward flexion; 145 degrees abduction; 35 degrees internal rotation, 35 degrees external rotation, 15 degrees extension, 40 degrees adduction.   Stability:   Overall:  Shoulder, elbow and wrist stable   Strength and Tone:   Overall full shoulder muscles strength, full upper arm strength and normal upper arm bulk and tone.  All other systems reviewed and are negative   The patient has been educated about the nature of the problem(s) and counseled on treatment options.  The patient appeared to understand what I have discussed and is in agreement with it.  Encounter Diagnoses  Name Primary?  . Chronic right shoulder pain Yes  . Cigarette nicotine dependence without complication     PLAN Call if any problems.  Precautions discussed.  Continue current medications.   Return to clinic 3 months   I have reviewed the [place] web site prior to prescribing narcotic medicine for this patient.   Electronically Signed Sanjuana Kava, MD 5/19/202010:04 AM

## 2019-04-18 ENCOUNTER — Telehealth: Payer: Self-pay | Admitting: Orthopaedic Surgery

## 2019-04-18 MED ORDER — HYDROCODONE-ACETAMINOPHEN 5-325 MG PO TABS: ORAL_TABLET | ORAL | 0 refills | Status: DC

## 2019-04-18 NOTE — Telephone Encounter (Signed)
Patient requests refill on Hydrocodone/Acetaminophen 5-325  Mgs.   Qty  22  Sig: One tablet by mouth every six hours as needed for pain. Limit 30 days.  [facility] uses [facility] [address].

## 2019-05-17 ENCOUNTER — Telehealth: Payer: Self-pay | Admitting: Orthopaedic Surgery

## 2019-05-17 NOTE — Telephone Encounter (Signed)
Patient requests a refill on Hydrocodone/Acetaminophen 5-325  Mgs.  Qty  22  Sig: One tablet by mouth every six hours as needed for pain. Limit 30 days.  [facility] [address].

## 2019-05-21 MED ORDER — HYDROCODONE-ACETAMINOPHEN 5-325 MG PO TABS: ORAL_TABLET | ORAL | 0 refills | Status: DC

## 2019-06-10 ENCOUNTER — Other Ambulatory Visit: Payer: Self-pay | Admitting: Internal Medicine

## 2019-06-10 DIAGNOSIS — Z20822 Contact with and (suspected) exposure to covid-19: Secondary | ICD-10-CM

## 2019-06-11 LAB — NOVEL CORONAVIRUS, NAA: SARS-CoV-2, NAA: NOT DETECTED

## 2019-06-18 ENCOUNTER — Ambulatory Visit: Payer: Medicare Other | Admitting: Orthopaedic Surgery

## 2019-06-20 ENCOUNTER — Ambulatory Visit (INDEPENDENT_AMBULATORY_CARE_PROVIDER_SITE_OTHER): Payer: Medicare Other | Admitting: Orthopaedic Surgery

## 2019-06-20 ENCOUNTER — Other Ambulatory Visit: Payer: Self-pay

## 2019-06-20 ENCOUNTER — Encounter: Payer: Self-pay | Admitting: Orthopaedic Surgery

## 2019-06-20 VITALS — BP 102/66 | HR 77 | Temp 97.7°F | Ht 66.0 in | Wt 136.0 lb

## 2019-06-20 DIAGNOSIS — M25511 Pain in right shoulder: Secondary | ICD-10-CM

## 2019-06-20 DIAGNOSIS — F17210 Nicotine dependence, cigarettes, uncomplicated: Secondary | ICD-10-CM

## 2019-06-20 DIAGNOSIS — G8929 Other chronic pain: Secondary | ICD-10-CM | POA: Diagnosis not present

## 2019-06-20 MED ORDER — DICLOFENAC SODIUM 75 MG PO TBEC: 75.0000 mg | DELAYED_RELEASE_TABLET | Freq: Two times a day (BID) | ORAL | 2 refills | Status: DC

## 2019-06-20 MED ORDER — HYDROCODONE-ACETAMINOPHEN 5-325 MG PO TABS: ORAL_TABLET | ORAL | 0 refills | Status: DC

## 2019-06-20 NOTE — Progress Notes (Signed)
Patient Aaron Spence, male DOB:[birth year], 43 y.o. IDP:[id number]  Chief Complaint  Patient presents with  . Shoulder Pain    Chronic right shoulder pain.    HPI  JOSEPHANTHONY TINDEL is a 43 y.o. male who has chronic pain of the right shoulder. He has pain with overhead use.  He has no numbness, redness or swelling.  He has no new trauma.  He is out of his diclofenac.     Body mass index is 21.95 kg/m.  ROS  Review of Systems  Constitutional:       Patient does not have Diabetes Mellitus. Patient has hypertension. Patient has COPD or shortness of breath. Patient does not have BMI > 35. Patient has current smoking history.  HENT: Negative for congestion.   Respiratory: Positive for cough and shortness of breath.   Cardiovascular: Negative for chest pain.  Endocrine: Positive for cold intolerance.  Musculoskeletal: Positive for arthralgias.  Allergic/Immunologic: Negative for environmental allergies.  All other systems reviewed and are negative.   All other systems reviewed and are negative.  The following is a summary of the past history medically, past history surgically, known current medicines, social history and family history.  This information is gathered electronically by the computer from prior information and documentation.  I review this each visit and have found including this information at this point in the chart is beneficial and informative.    Past Medical History:  Diagnosis Date  . Bronchitis   . Cancer ([place])   . Tobacco use     History reviewed. No pertinent surgical history.  Family History  Problem Relation Age of Onset  . Bronchiolitis Other     Social History Social History   Tobacco Use  . Smoking status: Current Every Day Smoker    Packs/day: 1.50    Years: 20.00    Pack years: 30.00    Types: Cigarettes  . Smokeless tobacco: Never Used  Substance Use Topics  . Alcohol use: No    Frequency: Never    Comment: occassional  .  Drug use: No    No Known Allergies  Current Outpatient Medications  Medication Sig Dispense Refill  . albuterol (PROVENTIL HFA;VENTOLIN HFA) 108 (90 Base) MCG/ACT inhaler Inhale 2 puffs into the lungs every 4 (four) hours as needed. 6.7 g 0  . diclofenac (VOLTAREN) 75 MG EC tablet Take 1 tablet (75 mg total) by mouth 2 (two) times daily with a meal. 60 tablet 2  . HYDROcodone-acetaminophen (NORCO/VICODIN) 5-325 MG tablet One tablet by mouth every six hours as needed for pain.  Limit 30 days. 22 tablet 0  . sodium chloride (OCEAN) 0.65 % SOLN nasal spray Place 1 spray into both nostrils as needed for congestion. 30 mL 0  . benzonatate (TESSALON) 100 MG capsule Take 2 capsules (200 mg total) by mouth 3 (three) times daily as needed. (Patient not taking: Reported on 06/20/2019) 30 capsule 0   No current facility-administered medications for this visit.      Physical Exam  Blood pressure 102/66, pulse 77, temperature 97.7 F (36.5 C), height 5\' 6"  (1.676 m), weight 136 lb (61.7 kg).  Constitutional: overall normal hygiene, normal nutrition, well developed, normal grooming, normal body habitus. Assistive device:none  Musculoskeletal: gait and station Limp none, muscle tone and strength are normal, no tremors or atrophy is present.  .  Neurological: coordination overall normal.  Deep tendon reflex/nerve stretch intact.  Sensation normal.  Cranial nerves II-XII intact.   Skin:  Normal overall no scars, lesions, ulcers or rashes. No psoriasis.  Psychiatric: Alert and oriented x 3.  Recent memory intact, remote memory unclear.  Normal mood and affect. Well groomed.  Good eye contact.  Cardiovascular: overall no swelling, no varicosities, no edema bilaterally, normal temperatures of the legs and arms, no clubbing, cyanosis and good capillary refill.  Lymphatic: palpation is normal.   Right shoulder has full ROM but pain in the extremes.  NV intact.  All other systems reviewed and are  negative   The patient has been educated about the nature of the problem(s) and counseled on treatment options.  The patient appeared to understand what I have discussed and is in agreement with it.  Encounter Diagnoses  Name Primary?  . Chronic right shoulder pain Yes  . Cigarette nicotine dependence without complication     PLAN Call if any problems.  Precautions discussed.  Continue current medications.   Return to clinic 3 months   I have reviewed the [place] web site prior to prescribing narcotic medicine for this patient.   Electronically Signed Sanjuana Kava, MD 8/20/20209:14 AM

## 2019-07-18 ENCOUNTER — Telehealth: Payer: Self-pay | Admitting: Orthopaedic Surgery

## 2019-07-18 MED ORDER — HYDROCODONE-ACETAMINOPHEN 5-325 MG PO TABS: ORAL_TABLET | ORAL | 0 refills | Status: DC

## 2019-07-18 NOTE — Telephone Encounter (Signed)
Patient requests refill on Hydrocodone/Acetaminophen 5-325  Mgs.   Qty  22 ° °Sig: One tablet by mouth every six hours as needed for pain.  Limit 30 days. ° °Patient states he uses [facility] on [address] °

## 2019-08-05 DIAGNOSIS — M549 Dorsalgia, unspecified: Secondary | ICD-10-CM | POA: Diagnosis not present

## 2019-08-05 DIAGNOSIS — Z1389 Encounter for screening for other disorder: Secondary | ICD-10-CM | POA: Diagnosis not present

## 2019-08-05 DIAGNOSIS — E049 Nontoxic goiter, unspecified: Secondary | ICD-10-CM | POA: Diagnosis not present

## 2019-08-19 ENCOUNTER — Telehealth: Payer: Self-pay | Admitting: Orthopaedic Surgery

## 2019-08-19 NOTE — Telephone Encounter (Signed)
Patient requests refill on Hydrocodone/Acetaminophen 5-325  Mgs.  Qty 22    Sig: One tablet by mouth every six hours as needed for pain. Limit 30 days.  Patient states he uses [facility] on [address]

## 2019-08-20 MED ORDER — HYDROCODONE-ACETAMINOPHEN 5-325 MG PO TABS: ORAL_TABLET | ORAL | 0 refills | Status: DC

## 2019-09-19 ENCOUNTER — Encounter: Payer: Self-pay | Admitting: Orthopaedic Surgery

## 2019-09-19 ENCOUNTER — Other Ambulatory Visit: Payer: Self-pay

## 2019-09-19 ENCOUNTER — Ambulatory Visit (INDEPENDENT_AMBULATORY_CARE_PROVIDER_SITE_OTHER): Payer: Medicare Other | Admitting: Orthopaedic Surgery

## 2019-09-19 VITALS — BP 111/68 | HR 97 | Temp 97.3°F | Ht 69.0 in | Wt 138.5 lb

## 2019-09-19 DIAGNOSIS — F17210 Nicotine dependence, cigarettes, uncomplicated: Secondary | ICD-10-CM

## 2019-09-19 DIAGNOSIS — G8929 Other chronic pain: Secondary | ICD-10-CM | POA: Diagnosis not present

## 2019-09-19 DIAGNOSIS — M25511 Pain in right shoulder: Secondary | ICD-10-CM | POA: Diagnosis not present

## 2019-09-19 MED ORDER — HYDROCODONE-ACETAMINOPHEN 5-325 MG PO TABS: ORAL_TABLET | ORAL | 0 refills | Status: DC

## 2019-09-19 NOTE — Patient Instructions (Signed)

## 2019-09-19 NOTE — Progress Notes (Signed)
Patient [id number] Aaron Spence, male DOB:1976/06/20, 43 y.o. [id number]  Chief Complaint  Patient presents with  . Shoulder Pain    R/bothering me since weather change    HPI  Aaron Spence is a 43 y.o. male who has continued pain of the right shoulder.  He has no new trauma,no redness, no swelling.  He has more pain with colder weather.   Body mass index is 20.45 kg/m.  ROS  Review of Systems  Constitutional:       Patient does not have Diabetes Mellitus. Patient has hypertension. Patient has COPD or shortness of breath. Patient does not have BMI > 35. Patient has current smoking history.  HENT: Negative for congestion.   Respiratory: Positive for cough and shortness of breath.   Cardiovascular: Negative for chest pain.  Endocrine: Positive for cold intolerance.  Musculoskeletal: Positive for arthralgias.  Allergic/Immunologic: Negative for environmental allergies.  All other systems reviewed and are negative.   All other systems reviewed and are negative.  The following is a summary of the past history medically, past history surgically, known current medicines, social history and family history.  This information is gathered electronically by the computer from prior information and documentation.  I review this each visit and have found including this information at this point in the chart is beneficial and informative.    Past Medical History:  Diagnosis Date  . Bronchitis   . Cancer ([place])   . Tobacco use     History reviewed. No pertinent surgical history.  Family History  Problem Relation Age of Onset  . Bronchiolitis Other     Social History Social History   Tobacco Use  . Smoking status: Current Every Day Smoker    Packs/day: 1.50    Years: 20.00    Pack years: 30.00    Types: Cigarettes  . Smokeless tobacco: Never Used  Substance Use Topics  . Alcohol use: No    Frequency: Never    Comment: occassional  . Drug use: No    No Known  Allergies  Current Outpatient Medications  Medication Sig Dispense Refill  . albuterol (PROVENTIL HFA;VENTOLIN HFA) 108 (90 Base) MCG/ACT inhaler Inhale 2 puffs into the lungs every 4 (four) hours as needed. 6.7 g 0  . benzonatate (TESSALON) 100 MG capsule Take 2 capsules (200 mg total) by mouth 3 (three) times daily as needed. (Patient not taking: Reported on 06/20/2019) 30 capsule 0  . diclofenac (VOLTAREN) 75 MG EC tablet Take 1 tablet (75 mg total) by mouth 2 (two) times daily with a meal. 60 tablet 2  . HYDROcodone-acetaminophen (NORCO/VICODIN) 5-325 MG tablet One tablet by mouth every six hours as needed for pain.  Limit 30 days. 22 tablet 0  . sodium chloride (OCEAN) 0.65 % SOLN nasal spray Place 1 spray into both nostrils as needed for congestion. 30 mL 0   No current facility-administered medications for this visit.      Physical Exam  Blood pressure 111/68, pulse 97, temperature (!) 97.3 F (36.3 C), height 5\' 9"  (1.753 m), weight 138 lb 8 oz (62.8 kg).  Constitutional: overall normal hygiene, normal nutrition, well developed, normal grooming, normal body habitus. Assistive device:none  Musculoskeletal: gait and station Limp none, muscle tone and strength are normal, no tremors or atrophy is present.  .  Neurological: coordination overall normal.  Deep tendon reflex/nerve stretch intact.  Sensation normal.  Cranial nerves II-XII intact.   Skin:   Normal overall no scars, lesions, ulcers or  rashes. No psoriasis.  Psychiatric: Alert and oriented x 3.  Recent memory intact, remote memory unclear.  Normal mood and affect. Well groomed.  Good eye contact.  Cardiovascular: overall no swelling, no varicosities, no edema bilaterally, normal temperatures of the legs and arms, no clubbing, cyanosis and good capillary refill.  Lymphatic: palpation is normal.  Right shoulder has near full motion with pain in the extremes.  NV intact.  Neck with full ROM.  All other systems reviewed  and are negative   The patient has been educated about the nature of the problem(s) and counseled on treatment options.  The patient appeared to understand what I have discussed and is in agreement with it.  Encounter Diagnoses  Name Primary?  . Chronic right shoulder pain Yes  . Cigarette nicotine dependence without complication     PLAN Call if any problems.  Precautions discussed.  Continue current medications.   Return to clinic 3 months   I have reviewed the [place] web site prior to prescribing narcotic medicine for this patient.   Electronically Signed Sanjuana Kava, MD 11/19/202010:53 AM

## 2019-10-21 ENCOUNTER — Telehealth: Payer: Self-pay | Admitting: Orthopaedic Surgery

## 2019-10-21 MED ORDER — HYDROCODONE-ACETAMINOPHEN 5-325 MG PO TABS: ORAL_TABLET | ORAL | 0 refills | Status: DC

## 2019-10-21 NOTE — Telephone Encounter (Signed)
Patient requests refill on Hydrocodone/Acetaminophen 5-325  Mgs.  Qty  22  Sig: One tablet by mouth every six hours as needed for pain. Limit 30 days.  Patient states he uses [facility] on [address]

## 2019-11-21 ENCOUNTER — Ambulatory Visit: Payer: Medicare Other | Attending: Internal Medicine

## 2019-11-21 ENCOUNTER — Other Ambulatory Visit: Payer: Self-pay

## 2019-11-21 DIAGNOSIS — Z20822 Contact with and (suspected) exposure to covid-19: Secondary | ICD-10-CM

## 2019-11-22 LAB — NOVEL CORONAVIRUS, NAA: SARS-CoV-2, NAA: NOT DETECTED

## 2019-12-04 ENCOUNTER — Other Ambulatory Visit: Payer: Self-pay

## 2019-12-04 ENCOUNTER — Ambulatory Visit: Payer: Medicare Other | Attending: Internal Medicine

## 2019-12-04 DIAGNOSIS — Z20822 Contact with and (suspected) exposure to covid-19: Secondary | ICD-10-CM | POA: Diagnosis not present

## 2019-12-06 LAB — NOVEL CORONAVIRUS, NAA: SARS-CoV-2, NAA: NOT DETECTED

## 2019-12-19 ENCOUNTER — Ambulatory Visit: Payer: Medicare Other | Admitting: Orthopaedic Surgery

## 2019-12-23 ENCOUNTER — Emergency Department (HOSPITAL_COMMUNITY)
Admission: EM | Admit: 2019-12-23 | Discharge: 2019-12-23 | Disposition: A | Discharge status: Home / Self Care | Payer: Medicare Other | Attending: Emergency Medicine | Admitting: Emergency Medicine

## 2019-12-23 ENCOUNTER — Other Ambulatory Visit: Payer: Self-pay

## 2019-12-23 ENCOUNTER — Encounter (HOSPITAL_COMMUNITY): Payer: Self-pay | Admitting: Emergency Medicine

## 2019-12-23 DIAGNOSIS — K625 Hemorrhage of anus and rectum: Secondary | ICD-10-CM | POA: Diagnosis not present

## 2019-12-23 DIAGNOSIS — Z5321 Procedure and treatment not carried out due to patient leaving prior to being seen by health care provider: Secondary | ICD-10-CM | POA: Diagnosis not present

## 2019-12-23 NOTE — ED Triage Notes (Signed)
Pt reports "seeing blood in the toilet" after defecating. Pt denies dizziness or lightheadedness.

## 2019-12-24 ENCOUNTER — Encounter: Payer: Medicare Other | Admitting: Orthopaedic Surgery

## 2019-12-31 ENCOUNTER — Ambulatory Visit (INDEPENDENT_AMBULATORY_CARE_PROVIDER_SITE_OTHER): Payer: Medicare Other | Admitting: Orthopaedic Surgery

## 2019-12-31 ENCOUNTER — Other Ambulatory Visit: Payer: Self-pay

## 2019-12-31 ENCOUNTER — Encounter: Payer: Self-pay | Admitting: Orthopaedic Surgery

## 2019-12-31 VITALS — BP 100/63 | HR 76 | Temp 98.1°F | Ht 69.0 in | Wt 124.0 lb

## 2019-12-31 DIAGNOSIS — G8929 Other chronic pain: Secondary | ICD-10-CM

## 2019-12-31 DIAGNOSIS — M25511 Pain in right shoulder: Secondary | ICD-10-CM

## 2019-12-31 DIAGNOSIS — F17210 Nicotine dependence, cigarettes, uncomplicated: Secondary | ICD-10-CM

## 2019-12-31 MED ORDER — HYDROCODONE-ACETAMINOPHEN 5-325 MG PO TABS: ORAL_TABLET | ORAL | 0 refills | Status: DC

## 2019-12-31 NOTE — Progress Notes (Signed)
Patient Aaron Spence [name], male DOB:July 01, 1976, 44 y.o. [id number]  Chief Complaint  Patient presents with  . Shoulder Pain    Chronic right shoulder pain    HPI  Aaron Spence is a 44 y.o. male who has right shoulder pain.  He is working at [employer] now.  He has no new trauma.  He has more pain with the cold weather.  He is taking his medicine.  He has no numbness or redness.   Body mass index is 18.31 kg/m.  ROS  Review of Systems  Constitutional:       Patient does not have Diabetes Mellitus. Patient has hypertension. Patient has COPD or shortness of breath. Patient does not have BMI > 35. Patient has current smoking history.  HENT: Negative for congestion.   Respiratory: Positive for cough and shortness of breath.   Cardiovascular: Negative for chest pain.  Endocrine: Positive for cold intolerance.  Genitourinary: Hematuria: .dx.  Musculoskeletal: Positive for arthralgias.  Allergic/Immunologic: Negative for environmental allergies.  All other systems reviewed and are negative.   All other systems reviewed and are negative.  The following is a summary of the past history medically, past history surgically, known current medicines, social history and family history.  This information is gathered electronically by the computer from prior information and documentation.  I review this each visit and have found including this information at this point in the chart is beneficial and informative.    Past Medical History:  Diagnosis Date  . Bronchitis   . Cancer ([place])   . Tobacco use     History reviewed. No pertinent surgical history.  Family History  Problem Relation Age of Onset  . Bronchiolitis Other     Social History Social History   Tobacco Use  . Smoking status: Current Every Day Smoker    Packs/day: 1.50    Years: 20.00    Pack years: 30.00    Types: Cigarettes  . Smokeless tobacco: Never Used  Substance Use Topics  . Alcohol use: No   Comment: occassional  . Drug use: No    No Known Allergies  Current Outpatient Medications  Medication Sig Dispense Refill  . albuterol (PROVENTIL HFA;VENTOLIN HFA) 108 (90 Base) MCG/ACT inhaler Inhale 2 puffs into the lungs every 4 (four) hours as needed. 6.7 g 0  . benzonatate (TESSALON) 100 MG capsule Take 2 capsules (200 mg total) by mouth 3 (three) times daily as needed. (Patient not taking: Reported on 06/20/2019) 30 capsule 0  . diclofenac (VOLTAREN) 75 MG EC tablet Take 1 tablet (75 mg total) by mouth 2 (two) times daily with a meal. 60 tablet 2  . HYDROcodone-acetaminophen (NORCO/VICODIN) 5-325 MG tablet One tablet by mouth every six hours as needed for pain.  Limit 30 days. 22 tablet 0  . sodium chloride (OCEAN) 0.65 % SOLN nasal spray Place 1 spray into both nostrils as needed for congestion. 30 mL 0   No current facility-administered medications for this visit.     Physical Exam  Blood pressure 100/63, pulse 76, temperature 98.1 F (36.7 C), height 5\' 9"  (1.753 m), weight 124 lb (56.2 kg).  Constitutional: overall normal hygiene, normal nutrition, well developed, normal grooming, normal body habitus. Assistive device:none  Musculoskeletal: gait and station Limp none, muscle tone and strength are normal, no tremors or atrophy is present.  .  Neurological: coordination overall normal.  Deep tendon reflex/nerve stretch intact.  Sensation normal.  Cranial nerves II-XII intact.   Skin:  Normal overall no scars, lesions, ulcers or rashes. No psoriasis.  Psychiatric: Alert and oriented x 3.  Recent memory intact, remote memory unclear.  Normal mood and affect. Well groomed.  Good eye contact.  Cardiovascular: overall no swelling, no varicosities, no edema bilaterally, normal temperatures of the legs and arms, no clubbing, cyanosis and good capillary refill.  Examination of right Upper Extremity is done.  Inspection:   Overall:  Elbow non-tender without crepitus or defects,  forearm non-tender without crepitus or defects, wrist non-tender without crepitus or defects, hand non-tender.    Shoulder: with glenohumeral joint tenderness, without effusion.   Upper arm:  without swelling and tenderness   Range of motion:   Overall:  Full range of motion of the elbow, full range of motion of wrist and full range of motion in fingers.   Shoulder:  right  165 degrees forward flexion; 150 degrees abduction; 30 degrees internal rotation, 30 degrees external rotation, 15 degrees extension, 40 degrees adduction.   Stability:   Overall:  Shoulder, elbow and wrist stable   Strength and Tone:   Overall full shoulder muscles strength, full upper arm strength and normal upper arm bulk and tone. Lymphatic: palpation is normal.  All other systems reviewed and are negative   Encounter Diagnoses  Name Primary?  . Chronic right shoulder pain Yes  . Cigarette nicotine dependence without complication     The patient has been educated about the nature of the problem(s) and counseled on treatment options.  The patient appeared to understand what I have discussed and is in agreement with it.   PLAN Call if any problems.  Precautions discussed.  Continue current medications.   Return to clinic 3 months   I have reviewed the [place] web site prior to prescribing narcotic medicine for this patient.   Electronically Signed Sanjuana Kava, MD 3/2/20213:09 PM

## 2020-01-29 ENCOUNTER — Telehealth: Payer: Self-pay

## 2020-01-29 MED ORDER — HYDROCODONE-ACETAMINOPHEN 5-325 MG PO TABS: ORAL_TABLET | ORAL | 0 refills | Status: DC

## 2020-01-29 NOTE — Telephone Encounter (Signed)
Hydrocodone-Acetaminophen 5/325 mg  Qty 22 Tablets  PATIENT USES [facility] ON  [address]

## 2020-03-25 ENCOUNTER — Other Ambulatory Visit: Payer: Self-pay

## 2020-03-25 ENCOUNTER — Encounter (HOSPITAL_COMMUNITY): Payer: Self-pay

## 2020-03-25 ENCOUNTER — Emergency Department (HOSPITAL_COMMUNITY): Payer: Medicare Other

## 2020-03-25 ENCOUNTER — Emergency Department (HOSPITAL_COMMUNITY)
Admission: EM | Admit: 2020-03-25 | Discharge: 2020-03-25 | Disposition: A | Discharge status: Home / Self Care | Payer: Medicare Other | Attending: Emergency Medicine | Admitting: Emergency Medicine

## 2020-03-25 DIAGNOSIS — Z20822 Contact with and (suspected) exposure to covid-19: Secondary | ICD-10-CM | POA: Insufficient documentation

## 2020-03-25 DIAGNOSIS — J40 Bronchitis, not specified as acute or chronic: Secondary | ICD-10-CM

## 2020-03-25 DIAGNOSIS — R0602 Shortness of breath: Secondary | ICD-10-CM | POA: Diagnosis not present

## 2020-03-25 DIAGNOSIS — R05 Cough: Secondary | ICD-10-CM | POA: Diagnosis not present

## 2020-03-25 DIAGNOSIS — Z859 Personal history of malignant neoplasm, unspecified: Secondary | ICD-10-CM | POA: Diagnosis not present

## 2020-03-25 LAB — COMPREHENSIVE METABOLIC PANEL
ALT: 14 U/L (ref 0–44)
AST: 13 U/L — ABNORMAL LOW (ref 15–41)
Albumin: 4.4 g/dL (ref 3.5–5.0)
Alkaline Phosphatase: 77 U/L (ref 38–126)
Anion gap: 11 (ref 5–15)
BUN: 9 mg/dL (ref 6–20)
CO2: 27 mmol/L (ref 22–32)
Calcium: 9.8 mg/dL (ref 8.9–10.3)
Chloride: 101 mmol/L (ref 98–111)
Creatinine, Ser: 1.09 mg/dL (ref 0.61–1.24)
GFR calc Af Amer: >60 mL/min (ref >60)
GFR calc non Af Amer: >60 mL/min (ref >60)
Glucose, Bld: 92 mg/dL (ref 70–99)
Potassium: 3.9 mmol/L (ref 3.5–5.1)
Sodium: 139 mmol/L (ref 135–145)
Total Bilirubin: 0.8 mg/dL (ref 0.3–1.2)
Total Protein: 7.3 g/dL (ref 6.5–8.1)

## 2020-03-25 LAB — CBC WITH DIFFERENTIAL/PLATELET
Abs Immature Granulocytes: 0.01 10*3/uL (ref 0.00–0.07)
Basophils Absolute: 0.1 10*3/uL (ref 0.0–0.1)
Basophils Relative: 1 %
Eosinophils Absolute: 0 10*3/uL (ref 0.0–0.5)
Eosinophils Relative: 0 %
HCT: 46.2 % (ref 39.0–52.0)
Hemoglobin: 14.9 g/dL (ref 13.0–17.0)
Immature Granulocytes: 0 %
Lymphocytes Relative: 12 %
Lymphs Abs: 1 10*3/uL (ref 0.7–4.0)
MCH: 28.5 pg (ref 26.0–34.0)
MCHC: 32.3 g/dL (ref 30.0–36.0)
MCV: 88.3 fL (ref 80.0–100.0)
Monocytes Absolute: 1 10*3/uL (ref 0.1–1.0)
Monocytes Relative: 13 %
Neutro Abs: 6.1 10*3/uL (ref 1.7–7.7)
Neutrophils Relative %: 74 %
Platelets: 283 10*3/uL (ref 150–400)
RBC: 5.23 MIL/uL (ref 4.22–5.81)
RDW: 13.6 % (ref 11.5–15.5)
WBC: 8.2 10*3/uL (ref 4.0–10.5)
nRBC: 0 % (ref 0.0–0.2)

## 2020-03-25 LAB — D-DIMER, QUANTITATIVE: D-Dimer, Quant: <0.27 ug/mL-FEU (ref 0.00–0.50)

## 2020-03-25 MED ORDER — SODIUM CHLORIDE 0.9 % IV BOLUS
1000.0000 mL | Freq: Once | INTRAVENOUS | Status: AC
  Administered 2020-03-25: 1000 mL via INTRAVENOUS

## 2020-03-25 MED ORDER — DOXYCYCLINE HYCLATE 100 MG PO CAPS: 100.0000 mg | ORAL_CAPSULE | Freq: Two times a day (BID) | ORAL | 0 refills | Status: DC

## 2020-03-25 NOTE — ED Provider Notes (Signed)
[facility] EMERGENCY DEPARTMENT Provider Note   CSN: [id number] Arrival date & time: 03/25/20  1710     History Chief Complaint  Patient presents with  . Cough    Aaron Spence is a 44 y.o. male.  Patient complains of a cough and congestion for a number days.  The history is provided by the patient. No language interpreter was used.  Cough Cough characteristics:  Productive Sputum characteristics:  Nondescript Severity:  Mild Onset quality:  Sudden Timing:  Constant Progression:  Worsening Chronicity:  New Smoker: yes   Context: not animal exposure   Relieved by:  Nothing Associated symptoms: no chest pain, no eye discharge, no headaches and no rash        Past Medical History:  Diagnosis Date  . Bronchitis   . Cancer ([place])   . Tobacco use     Patient Active Problem List   Diagnosis Date Noted  . Right shoulder pain 12/08/2015  . Pain in joint, shoulder region 05/05/2014  . Muscle weakness (generalized) 05/05/2014    History reviewed. No pertinent surgical history.     Family History  Problem Relation Age of Onset  . Bronchiolitis Other     Social History   Tobacco Use  . Smoking status: Current Every Day Smoker    Packs/day: 1.50    Years: 20.00    Pack years: 30.00    Types: Cigarettes  . Smokeless tobacco: Never Used  Substance Use Topics  . Alcohol use: No    Comment: occassional  . Drug use: No    Home Medications Prior to Admission medications   Medication Sig Start Date End Date Taking? Authorizing Provider  doxycycline (VIBRAMYCIN) 100 MG capsule Take 1 capsule (100 mg total) by mouth 2 (two) times daily. One po bid x 7 days 03/25/20   Milton Ferguson, MD    Allergies    Patient has no known allergies.  Review of Systems   Review of Systems  Constitutional: Negative for appetite change and fatigue.  HENT: Negative for congestion, ear discharge and sinus pressure.   Eyes: Negative for discharge.  Respiratory: Positive for  cough.   Cardiovascular: Negative for chest pain.  Gastrointestinal: Negative for abdominal pain and diarrhea.  Genitourinary: Negative for frequency and hematuria.  Musculoskeletal: Negative for back pain.  Skin: Negative for rash.  Neurological: Negative for seizures and headaches.  Psychiatric/Behavioral: Negative for hallucinations.    Physical Exam Updated Vital Signs BP 133/83   Pulse (!) 135   Temp 98.2 F (36.8 C) (Oral)   Resp 15   Ht 5\' 2"  (1.575 m)   Wt 56.7 kg   SpO2 95%   BMI 22.86 kg/m   Physical Exam Vitals and nursing note reviewed.  Constitutional:      Appearance: He is well-developed.  HENT:     Head: Normocephalic.     Nose: Nose normal.  Eyes:     General: No scleral icterus.    Conjunctiva/sclera: Conjunctivae normal.  Neck:     Thyroid: No thyromegaly.  Cardiovascular:     Rate and Rhythm: Normal rate and regular rhythm.     Heart sounds: No murmur. No friction rub. No gallop.   Pulmonary:     Breath sounds: No stridor. No wheezing or rales.  Chest:     Chest wall: No tenderness.  Abdominal:     General: There is no distension.     Tenderness: There is no abdominal tenderness. There is no rebound.  Musculoskeletal:        General: Normal range of motion.     Cervical back: Neck supple.  Lymphadenopathy:     Cervical: No cervical adenopathy.  Skin:    Findings: No erythema or rash.  Neurological:     Mental Status: He is alert and oriented to person, place, and time.     Motor: No abnormal muscle tone.     Coordination: Coordination normal.  Psychiatric:        Behavior: Behavior normal.     ED Results / Procedures / Treatments   Labs (all labs ordered are listed, but only abnormal results are displayed) Labs Reviewed  COMPREHENSIVE METABOLIC PANEL - Abnormal; Notable for the following components:      Result Value   AST 13 (*)    All other components within normal limits  SARS CORONAVIRUS 2 (TAT 6-24 HRS)  CBC WITH  DIFFERENTIAL/PLATELET  D-DIMER, QUANTITATIVE (NOT AT [facility])    EKG None  Radiology DG Chest Port 1 View  Result Date: 03/25/2020 CLINICAL DATA:  Short of breath, productive cough, history of leukemia EXAM: PORTABLE CHEST 1 VIEW COMPARISON:  12/12/2018 FINDINGS: The heart size and mediastinal contours are within normal limits. Both lungs are clear. The visualized skeletal structures are unremarkable. IMPRESSION: No active disease. Electronically Signed   By: Randa Ngo M.D.   On: 03/25/2020 18:23    Procedures Procedures (including critical care time)  Medications Ordered in ED Medications  sodium chloride 0.9 % bolus 1,000 mL (1,000 mLs Intravenous New Bag/Given 03/25/20 1756)    ED Course  I have reviewed the triage vital signs and the nursing notes.  Pertinent labs & imaging results that were available during my care of the patient were reviewed by me and considered in my medical decision making (see chart for details).    MDM Rules/Calculators/A&P                     Labs chest x-ray negative.  Patient with bronchitis.  Patient is placed on doxycycline will follow up with PCP as needed.  Covid test pending       This patient presents to the ED for concern cough shortness of breath this involves an extensive number of treatment options, and is a complaint that carries with it a high risk of complications and morbidity.  The differential diagnosis includes PE bronchitis pneumonia   Lab Tests:   I Ordered, reviewed, and interpreted labs, which included CBC chemistries D-dimer which were all unremarkable  Medicines ordered:   I ordered medication doxycycline for bronchitis as a prescription  Imaging Studies ordered:   I ordered imaging studies which included chest x-ray and  I independently visualized and interpreted imaging which showed no acute disease  Additional history obtained:   Additional history obtained from records  Previous records obtained  and reviewed   Consultations Obtained:   Reevaluation:  After the interventions stated above, I reevaluated the patient and found unchanged  Critical Interventions:  .   Final Clinical Impression(s) / ED Diagnoses Final diagnoses:  Bronchitis    Rx / DC Orders ED Discharge Orders         Ordered    doxycycline (VIBRAMYCIN) 100 MG capsule  2 times daily     03/25/20 1950           Milton Ferguson, MD 03/25/20 1952

## 2020-03-25 NOTE — Discharge Instructions (Addendum)
Follow-up with your family doctor next week if not improving.  Your Covid test should be back in a couple days

## 2020-03-25 NOTE — ED Triage Notes (Signed)
Pt presents to ED with complaints of cough since yesterday. Pt denies fever.

## 2020-03-26 LAB — SARS CORONAVIRUS 2 (TAT 6-24 HRS): SARS Coronavirus 2: NEGATIVE

## 2020-03-31 ENCOUNTER — Encounter: Payer: Self-pay | Admitting: Orthopaedic Surgery

## 2020-03-31 ENCOUNTER — Ambulatory Visit (INDEPENDENT_AMBULATORY_CARE_PROVIDER_SITE_OTHER): Payer: Medicare Other | Admitting: Orthopaedic Surgery

## 2020-03-31 ENCOUNTER — Other Ambulatory Visit: Payer: Self-pay

## 2020-03-31 VITALS — BP 115/76 | HR 84 | Ht 66.0 in | Wt 126.4 lb

## 2020-03-31 DIAGNOSIS — F17210 Nicotine dependence, cigarettes, uncomplicated: Secondary | ICD-10-CM

## 2020-03-31 DIAGNOSIS — G8929 Other chronic pain: Secondary | ICD-10-CM

## 2020-03-31 DIAGNOSIS — M25511 Pain in right shoulder: Secondary | ICD-10-CM | POA: Diagnosis not present

## 2020-03-31 MED ORDER — HYDROCODONE-ACETAMINOPHEN 5-325 MG PO TABS: ORAL_TABLET | ORAL | 0 refills | Status: DC

## 2020-03-31 NOTE — Progress Notes (Addendum)
Patient [id number] Aaron Spence, male DOB:02/04/1976, 44 y.o. [id number]  Chief Complaint  Patient presents with  . Shoulder Pain    doing better    HPI  Aaron Spence is a 44 y.o. male who has chronic pain of the right shoulder.  He has no new trauma.  He is doing his exercises and taking his medicine.  He has no weakness or paresthesias.   Body mass index is 20.4 kg/m.  ROS  Review of Systems  Constitutional:       Patient does not have Diabetes Mellitus. Patient has hypertension. Patient has COPD or shortness of breath. Patient does not have BMI > 35. Patient has current smoking history.  HENT: Negative for congestion.   Respiratory: Positive for cough and shortness of breath.   Cardiovascular: Negative for chest pain.  Endocrine: Positive for cold intolerance.  Genitourinary: Hematuria: .dx.  Musculoskeletal: Positive for arthralgias.  Allergic/Immunologic: Negative for environmental allergies.  All other systems reviewed and are negative.   All other systems reviewed and are negative.  The following is a summary of the past history medically, past history surgically, known current medicines, social history and family history.  This information is gathered electronically by the computer from prior information and documentation.  I review this each visit and have found including this information at this point in the chart is beneficial and informative.    Past Medical History:  Diagnosis Date  . Bronchitis   . Cancer ([place])   . Tobacco use     History reviewed. No pertinent surgical history.  Family History  Problem Relation Age of Onset  . Bronchiolitis Other     Social History Social History   Tobacco Use  . Smoking status: Current Every Day Smoker    Packs/day: 1.50    Years: 20.00    Pack years: 30.00    Types: Cigarettes  . Smokeless tobacco: Never Used  Substance Use Topics  . Alcohol use: No    Comment: occassional  . Drug use: No    No  Known Allergies  Current Outpatient Medications  Medication Sig Dispense Refill  . doxycycline (VIBRAMYCIN) 100 MG capsule Take 1 capsule (100 mg total) by mouth 2 (two) times daily. One po bid x 7 days 14 capsule 0  . HYDROcodone-acetaminophen (NORCO/VICODIN) 5-325 MG tablet One tablet every six hours for pain.  Limit 7 days. 28 tablet 0   No current facility-administered medications for this visit.     Physical Exam  Blood pressure 115/76, pulse 84, height 5\' 6"  (1.676 m), weight 126 lb 6 oz (57.3 kg).  Constitutional: overall normal hygiene, normal nutrition, well developed, normal grooming, normal body habitus. Assistive device:none  Musculoskeletal: gait and station Limp none, muscle tone and strength are normal, no tremors or atrophy is present.  .  Neurological: coordination overall normal.  Deep tendon reflex/nerve stretch intact.  Sensation normal.  Cranial nerves II-XII intact.   Skin:   Normal overall no scars, lesions, ulcers or rashes. No psoriasis.  Psychiatric: Alert and oriented x 3.  Recent memory intact, remote memory unclear.  Normal mood and affect. Well groomed.  Good eye contact.  Cardiovascular: overall no swelling, no varicosities, no edema bilaterally, normal temperatures of the legs and arms, no clubbing, cyanosis and good capillary refill.  Lymphatic: palpation is normal.  Right shoulder has near full ROM but pain in the extremes.  All other systems reviewed and are negative   The patient has been educated about the  nature of the problem(s) and counseled on treatment options.  The patient appeared to understand what I have discussed and is in agreement with it.  Encounter Diagnoses  Name Primary?  . Chronic right shoulder pain Yes  . Cigarette nicotine dependence without complication     PLAN Call if any problems.  Precautions discussed.  Continue current medications.   Return to clinic 3 months   I have reviewed the [place] web site prior to prescribing narcotic medicine for this patient.   Electronically Signed Sanjuana Kava, MD 6/1/20213:35 PM

## 2020-04-02 ENCOUNTER — Telehealth: Payer: Self-pay | Admitting: *Deleted

## 2020-04-02 NOTE — Telephone Encounter (Signed)
Patient called ,given negative covid results .

## 2020-06-30 ENCOUNTER — Ambulatory Visit: Payer: Medicare Other | Admitting: Orthopaedic Surgery

## 2020-07-07 ENCOUNTER — Ambulatory Visit: Payer: Medicare Other | Admitting: Orthopaedic Surgery

## 2020-07-08 ENCOUNTER — Ambulatory Visit: Payer: Medicare Other | Admitting: Orthopaedic Surgery

## 2020-07-08 ENCOUNTER — Encounter: Payer: Self-pay | Admitting: Orthopaedic Surgery

## 2020-07-14 ENCOUNTER — Encounter: Payer: Self-pay | Admitting: Orthopaedic Surgery

## 2020-07-14 ENCOUNTER — Ambulatory Visit (INDEPENDENT_AMBULATORY_CARE_PROVIDER_SITE_OTHER): Payer: Medicare Other | Admitting: Orthopaedic Surgery

## 2020-07-14 ENCOUNTER — Other Ambulatory Visit: Payer: Self-pay

## 2020-07-14 VITALS — BP 112/68 | HR 111 | Ht 66.0 in | Wt 123.0 lb

## 2020-07-14 DIAGNOSIS — M25511 Pain in right shoulder: Secondary | ICD-10-CM | POA: Diagnosis not present

## 2020-07-14 DIAGNOSIS — G8929 Other chronic pain: Secondary | ICD-10-CM

## 2020-07-14 DIAGNOSIS — F17210 Nicotine dependence, cigarettes, uncomplicated: Secondary | ICD-10-CM

## 2020-07-14 MED ORDER — HYDROCODONE-ACETAMINOPHEN 5-325 MG PO TABS: ORAL_TABLET | ORAL | 0 refills | Status: DC

## 2020-07-14 NOTE — Progress Notes (Signed)
Patient NO:MVEHM Aaron Spence, male DOB:June 09, 1976, 44 y.o. CNO:[id number]  Chief Complaint  Patient presents with  . Shoulder Pain    right shoulder pain, comes and goes.     HPI  Aaron Spence is a 44 y.o. male who has chronic pain of the right shoulder.  He is doing his exercises and taking his medicine.  He is working a regular schedule.  He has no numbness, no trauma.  He has good and bad days.   Body mass index is 19.85 kg/m.  ROS  Review of Systems  Constitutional:       Patient does not have Diabetes Mellitus. Patient has hypertension. Patient has COPD or shortness of breath. Patient does not have BMI > 35. Patient has current smoking history.  HENT: Negative for congestion.   Respiratory: Positive for cough and shortness of breath.   Cardiovascular: Negative for chest pain.  Endocrine: Positive for cold intolerance.  Genitourinary: Hematuria: .dx.  Musculoskeletal: Positive for arthralgias.  Allergic/Immunologic: Negative for environmental allergies.  All other systems reviewed and are negative.   All other systems reviewed and are negative.  The following is a summary of the past history medically, past history surgically, known current medicines, social history and family history.  This information is gathered electronically by the computer from prior information and documentation.  I review this each visit and have found including this information at this point in the chart is beneficial and informative.    Past Medical History:  Diagnosis Date  . Bronchitis   . Cancer ([place])   . Tobacco use     History reviewed. No pertinent surgical history.  Family History  Problem Relation Age of Onset  . Bronchiolitis Other     Social History Social History   Tobacco Use  . Smoking status: Current Every Day Smoker    Packs/day: 1.50    Years: 20.00    Pack years: 30.00    Types: Cigarettes  . Smokeless tobacco: Never Used  Vaping Use  . Vaping Use: Never  used  Substance Use Topics  . Alcohol use: No    Comment: occassional  . Drug use: No    No Known Allergies  Current Outpatient Medications  Medication Sig Dispense Refill  . doxycycline (VIBRAMYCIN) 100 MG capsule Take 1 capsule (100 mg total) by mouth 2 (two) times daily. One po bid x 7 days 14 capsule 0  . HYDROcodone-acetaminophen (NORCO/VICODIN) 5-325 MG tablet One tablet every six hours for pain.  Limit 7 days. 28 tablet 0   No current facility-administered medications for this visit.     Physical Exam  Blood pressure 112/68, pulse (!) 111, height 5\' 6"  (1.676 m), weight 123 lb (55.8 kg).  Constitutional: overall normal hygiene, normal nutrition, well developed, normal grooming, normal body habitus. Assistive device:none  Musculoskeletal: gait and station Limp none, muscle tone and strength are normal, no tremors or atrophy is present.  .  Neurological: coordination overall normal.  Deep tendon reflex/nerve stretch intact.  Sensation normal.  Cranial nerves II-XII intact.   Skin:   Normal overall no scars, lesions, ulcers or rashes. No psoriasis.  Psychiatric: Alert and oriented x 3.  Recent memory intact, remote memory unclear.  Normal mood and affect. Well groomed.  Good eye contact.  Cardiovascular: overall no swelling, no varicosities, no edema bilaterally, normal temperatures of the legs and arms, no clubbing, cyanosis and good capillary refill.  Lymphatic: palpation is normal.  Right shoulder with pain in the extremes.  NV intact.  All other systems reviewed and are negative   The patient has been educated about the nature of the problem(s) and counseled on treatment options.  The patient appeared to understand what I have discussed and is in agreement with it.  Encounter Diagnoses  Name Primary?  . Chronic right shoulder pain Yes  . Cigarette nicotine dependence without complication     PLAN Call if any problems.  Precautions discussed.  Continue current  medications.   Return to clinic 3 months   I have reviewed the [place] web site prior to prescribing narcotic medicine for this patient.   Electronically Signed Sanjuana Kava, MD 9/14/20212:35 PM

## 2020-07-21 ENCOUNTER — Ambulatory Visit: Payer: Medicare Other | Admitting: Orthopaedic Surgery

## 2020-09-01 ENCOUNTER — Telehealth: Payer: Self-pay | Admitting: Orthopaedic Surgery

## 2020-09-01 MED ORDER — HYDROCODONE-ACETAMINOPHEN 5-325 MG PO TABS: ORAL_TABLET | ORAL | 0 refills | Status: DC

## 2020-09-01 NOTE — Telephone Encounter (Signed)
Patient requests refill on Hydrocodone/Acetaminophen 5-325  Mgs.  Qty  28  Sig: One tablet every six hours for pain. Limit 7 days.  Patient states he uses [facility] on [address]

## 2020-10-13 ENCOUNTER — Ambulatory Visit: Payer: Medicare Other | Admitting: Orthopaedic Surgery

## 2020-10-22 DIAGNOSIS — H6121 Impacted cerumen, right ear: Secondary | ICD-10-CM | POA: Diagnosis not present

## 2020-10-22 DIAGNOSIS — E049 Nontoxic goiter, unspecified: Secondary | ICD-10-CM | POA: Diagnosis not present

## 2020-10-22 DIAGNOSIS — F17210 Nicotine dependence, cigarettes, uncomplicated: Secondary | ICD-10-CM | POA: Diagnosis not present

## 2020-10-22 DIAGNOSIS — Z1389 Encounter for screening for other disorder: Secondary | ICD-10-CM | POA: Diagnosis not present

## 2020-10-22 DIAGNOSIS — F17208 Nicotine dependence, unspecified, with other nicotine-induced disorders: Secondary | ICD-10-CM | POA: Diagnosis not present

## 2020-10-22 DIAGNOSIS — M549 Dorsalgia, unspecified: Secondary | ICD-10-CM | POA: Diagnosis not present

## 2020-10-27 ENCOUNTER — Other Ambulatory Visit: Payer: Medicare Other

## 2020-10-27 ENCOUNTER — Other Ambulatory Visit: Payer: Self-pay

## 2020-10-27 DIAGNOSIS — Z20822 Contact with and (suspected) exposure to covid-19: Secondary | ICD-10-CM

## 2020-10-28 LAB — SARS-COV-2, NAA 2 DAY TAT

## 2020-10-28 LAB — NOVEL CORONAVIRUS, NAA: SARS-CoV-2, NAA: DETECTED — AB

## 2020-10-28 LAB — SPECIMEN STATUS REPORT

## 2020-11-03 ENCOUNTER — Ambulatory Visit: Payer: Medicare Other | Admitting: Orthopaedic Surgery

## 2020-11-10 ENCOUNTER — Other Ambulatory Visit: Payer: Medicare Other

## 2020-11-10 DIAGNOSIS — Z20822 Contact with and (suspected) exposure to covid-19: Secondary | ICD-10-CM

## 2020-11-12 ENCOUNTER — Ambulatory Visit: Payer: Medicare Other | Admitting: Orthopaedic Surgery

## 2020-11-12 LAB — NOVEL CORONAVIRUS, NAA: SARS-CoV-2, NAA: NOT DETECTED

## 2020-11-12 LAB — SARS-COV-2, NAA 2 DAY TAT

## 2020-11-13 ENCOUNTER — Telehealth: Payer: Self-pay | Admitting: Internal Medicine

## 2020-11-24 ENCOUNTER — Other Ambulatory Visit: Payer: Self-pay

## 2020-11-24 ENCOUNTER — Encounter: Payer: Self-pay | Admitting: Orthopaedic Surgery

## 2020-11-24 ENCOUNTER — Ambulatory Visit (INDEPENDENT_AMBULATORY_CARE_PROVIDER_SITE_OTHER): Payer: Medicare Other | Admitting: Orthopaedic Surgery

## 2020-11-24 VITALS — BP 107/65 | HR 89 | Ht 66.0 in

## 2020-11-24 DIAGNOSIS — G8929 Other chronic pain: Secondary | ICD-10-CM

## 2020-11-24 DIAGNOSIS — F17210 Nicotine dependence, cigarettes, uncomplicated: Secondary | ICD-10-CM | POA: Diagnosis not present

## 2020-11-24 DIAGNOSIS — M25511 Pain in right shoulder: Secondary | ICD-10-CM | POA: Diagnosis not present

## 2020-11-24 MED ORDER — HYDROCODONE-ACETAMINOPHEN 5-325 MG PO TABS: ORAL_TABLET | ORAL | 0 refills | Status: DC

## 2020-11-24 NOTE — Progress Notes (Signed)
Patient Aaron Spence:HWEXH MABEL UNREIN, male DOB:[birth year], 45 y.o. BZJ:[id number]  Chief Complaint  Patient presents with  . Shoulder Pain    Right     HPI  Aaron Spence is a 45 y.o. male who has chronic right shoulder pain.  The cold weather and the snow has made it worse.  He has no new trauma, no numbness.  He is active.  He is out of his medicine.   Body mass index is 19.85 kg/m.  ROS  Review of Systems  Constitutional:       Patient does not have Diabetes Mellitus. Patient has hypertension. Patient has COPD or shortness of breath. Patient does not have BMI > 35. Patient has current smoking history.  HENT: Negative for congestion.   Respiratory: Positive for cough and shortness of breath.   Cardiovascular: Negative for chest pain.  Endocrine: Positive for cold intolerance.  Genitourinary: Hematuria: .dx.  Musculoskeletal: Positive for arthralgias.  Allergic/Immunologic: Negative for environmental allergies.  All other systems reviewed and are negative.   All other systems reviewed and are negative.  The following is a summary of the past history medically, past history surgically, known current medicines, social history and family history.  This information is gathered electronically by the computer from prior information and documentation.  I review this each visit and have found including this information at this point in the chart is beneficial and informative.    Past Medical History:  Diagnosis Date  . Bronchitis   . Cancer ([place])   . Tobacco use     History reviewed. No pertinent surgical history.  Family History  Problem Relation Age of Onset  . Bronchiolitis Other     Social History Social History   Tobacco Use  . Smoking status: Current Every Day Smoker    Packs/day: 1.50    Years: 20.00    Pack years: 30.00    Types: Cigarettes  . Smokeless tobacco: Never Used  Vaping Use  . Vaping Use: Never used  Substance Use Topics  . Alcohol use: No     Comment: occassional  . Drug use: No    No Known Allergies  Current Outpatient Medications  Medication Sig Dispense Refill  . doxycycline (VIBRAMYCIN) 100 MG capsule Take 1 capsule (100 mg total) by mouth 2 (two) times daily. One po bid x 7 days 14 capsule 0  . HYDROcodone-acetaminophen (NORCO/VICODIN) 5-325 MG tablet One tablet every six hours for pain.  Limit 7 days. 25 tablet 0   No current facility-administered medications for this visit.     Physical Exam  Blood pressure 107/65, pulse 89, height 5\' 6"  (1.676 m).  Constitutional: overall normal hygiene, normal nutrition, well developed, normal grooming, normal body habitus. Assistive device:none  Musculoskeletal: gait and station Limp none, muscle tone and strength are normal, no tremors or atrophy is present.  .  Neurological: coordination overall normal.  Deep tendon reflex/nerve stretch intact.  Sensation normal.  Cranial nerves II-XII intact.   Skin:   Normal overall no scars, lesions, ulcers or rashes. No psoriasis.  Psychiatric: Alert and oriented x 3.  Recent memory intact, remote memory unclear.  Normal mood and affect. Well groomed.  Good eye contact.  Cardiovascular: overall no swelling, no varicosities, no edema bilaterally, normal temperatures of the legs and arms, no clubbing, cyanosis and good capillary refill.  Lymphatic: palpation is normal.  Right shoulder has pain in the extremes, NV intact.  Grips normal.  ROM neck is full.  All other systems  reviewed and are negative   The patient has been educated about the nature of the problem(s) and counseled on treatment options.  The patient appeared to understand what I have discussed and is in agreement with it.  Encounter Diagnoses  Name Primary?  . Chronic right shoulder pain Yes  . Cigarette nicotine dependence without complication     PLAN Call if any problems.  Precautions discussed.  Continue current medications.   Return to clinic 3 months   I  have reviewed the [place] web site prior to prescribing narcotic medicine for this patient.   Electronically Signed Sanjuana Kava, MD 1/25/20223:01 PM

## 2021-01-27 ENCOUNTER — Telehealth: Payer: Self-pay | Admitting: Orthopaedic Surgery

## 2021-01-27 NOTE — Telephone Encounter (Signed)
Patient requests prescription for Hydrocodone/Acetaminophen 5-325 mgs.  Qty 25  Sig: One tablet every six hours for pain. Limit 7 days.  [facility]

## 2021-01-28 MED ORDER — HYDROCODONE-ACETAMINOPHEN 5-325 MG PO TABS: ORAL_TABLET | ORAL | 0 refills | Status: DC

## 2021-02-18 ENCOUNTER — Encounter (HOSPITAL_COMMUNITY): Payer: Self-pay | Admitting: *Deleted

## 2021-02-18 ENCOUNTER — Emergency Department (HOSPITAL_COMMUNITY): Payer: Medicare Other

## 2021-02-18 ENCOUNTER — Emergency Department (HOSPITAL_COMMUNITY)
Admission: EM | Admit: 2021-02-18 | Discharge: 2021-02-18 | Disposition: A | Discharge status: Home / Self Care | Payer: Medicare Other | Attending: Emergency Medicine | Admitting: Emergency Medicine

## 2021-02-18 ENCOUNTER — Other Ambulatory Visit: Payer: Self-pay

## 2021-02-18 DIAGNOSIS — M25512 Pain in left shoulder: Secondary | ICD-10-CM | POA: Insufficient documentation

## 2021-02-18 DIAGNOSIS — Z85828 Personal history of other malignant neoplasm of skin: Secondary | ICD-10-CM | POA: Insufficient documentation

## 2021-02-18 DIAGNOSIS — F17210 Nicotine dependence, cigarettes, uncomplicated: Secondary | ICD-10-CM | POA: Insufficient documentation

## 2021-02-18 MED ORDER — IBUPROFEN 400 MG PO TABS
600.0000 mg | ORAL_TABLET | Freq: Once | ORAL | Status: AC
  Administered 2021-02-18: 600 mg via ORAL
  Filled 2021-02-18: qty 2

## 2021-02-18 MED ORDER — IBUPROFEN 600 MG PO TABS: 600.0000 mg | ORAL_TABLET | Freq: Four times a day (QID) | ORAL | 0 refills | Status: DC | PRN

## 2021-02-18 NOTE — ED Provider Notes (Signed)
[place] Provider Note   CSN: [id number] Arrival date & time: 02/18/21  1700     History No chief complaint on file.   Aaron Spence is a 45 y.o. male.  Aaron Spence is a 45 y.o. male with hx of bronchitis, chronic right shoulder pain, who presents with left shoulder pain for the past 4 days.  He reports he woke up with this pain and reports that his shoulder felt very stiff, with certain movements the pain would shoot down his arm.  He denies any swelling, redness or warmth, no fevers.  No numbness, tingling or weakness in the arm.  He has not taken anything to treat this pain.  Denies any injury or trauma.  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        Past Medical History:  Diagnosis Date  . Bronchitis   . Cancer ([place])   . Tobacco use     Patient Active Problem List   Diagnosis Date Noted  . Right shoulder pain 12/08/2015  . Pain in joint, shoulder region 05/05/2014  . Muscle weakness (generalized) 05/05/2014    History reviewed. No pertinent surgical history.     Family History  Problem Relation Age of Onset  . Bronchiolitis Other     Social History   Tobacco Use  . Smoking status: Current Every Day Smoker    Packs/day: 1.50    Years: 20.00    Pack years: 30.00    Types: Cigarettes  . Smokeless tobacco: Never Used  Vaping Use  . Vaping Use: Never used  Substance Use Topics  . Alcohol use: No    Comment: occassional  . Drug use: No    Home Medications Prior to Admission medications   Medication Sig Start Date End Date Taking? Authorizing Provider  doxycycline (VIBRAMYCIN) 100 MG capsule Take 1 capsule (100 mg total) by mouth 2 (two) times daily. One po bid x 7 days 03/25/20   Milton Ferguson, MD  HYDROcodone-acetaminophen (NORCO/VICODIN) 5-325 MG tablet One tablet every six hours for pain.  Limit 7 days. 01/28/21   Sanjuana Kava, MD    Allergies    Patient has no known  allergies.  Review of Systems   Review of Systems  Constitutional: Negative for chills and fever.  Musculoskeletal: Positive for arthralgias. Negative for joint swelling.  Skin: Negative for color change and rash.  Neurological: Negative for dizziness, syncope and light-headedness.  All other systems reviewed and are negative.   Physical Exam Updated Vital Signs BP 112/61   Pulse 96   Temp 98.3 F (36.8 C) (Oral)   Resp 18   Ht 5\' 6"  (1.676 m)   Wt 56.7 kg   SpO2 98%   BMI 20.18 kg/m   Physical Exam Vitals and nursing note reviewed.  Constitutional:      General: He is not in acute distress.    Appearance: Normal appearance. He is well-developed. He is not ill-appearing or diaphoretic.  HENT:     Head: Normocephalic and atraumatic.  Eyes:     General:        Right eye: No discharge.        Left eye: No discharge.  Pulmonary:     Effort: Pulmonary effort is normal. No respiratory distress.  Musculoskeletal:        General: Tenderness present.     Comments: Mild tenderness over the anterior and posterior aspects of the left shoulder  without swelling or erythema, no obvious deformity.  Patient is able to fully range the shoulder with passive and active range of motion.  Normal range of motion without pain at the elbow and wrist.  2+ distal pulses, normal sensation and strength.  Neurological:     Mental Status: He is alert and oriented to person, place, and time.     Coordination: Coordination normal.  Psychiatric:        Mood and Affect: Mood normal.        Behavior: Behavior normal.     ED Results / Procedures / Treatments   Labs (all labs ordered are listed, but only abnormal results are displayed) Labs Reviewed - No data to display  EKG None  Radiology DG Shoulder Left  Result Date: 02/18/2021 CLINICAL DATA:  Left shoulder pain EXAM: LEFT SHOULDER - 2+ VIEW COMPARISON:  09/14/2016 FINDINGS: There is no evidence of fracture or dislocation. There is no  evidence of arthropathy or other focal bone abnormality. Soft tissues are unremarkable. IMPRESSION: Negative. Electronically Signed   By: Rolm Baptise M.D.   On: 02/18/2021 18:51    Procedures Procedures   Medications Ordered in ED Medications  ibuprofen (ADVIL) tablet 600 mg (has no administration in time range)    ED Course  I have reviewed the triage vital signs and the nursing notes.  Pertinent labs & imaging results that were available during my care of the patient were reviewed by me and considered in my medical decision making (see chart for details).    MDM Rules/Calculators/A&P                         Patient X-Ray negative for obvious fracture or dislocation. Pain managed in ED. Pt advised to follow up with orthopedics if symptoms persist. Conservative therapy recommended and discussed. Patient will be dc home & is agreeable with above plan.  Final Clinical Impression(s) / ED Diagnoses Final diagnoses:  Acute pain of left shoulder    Rx / DC Orders ED Discharge Orders    None       Janet Berlin 02/18/21 2111    Sherwood Gambler, MD 02/20/21 [phone]

## 2021-02-18 NOTE — Discharge Instructions (Signed)
Use ibuprofen, Tylenol, ice and heat to help with shoulder pain.  I provided range of motion exercises which I would like you to do daily.  If shoulder pain is not improving please follow-up with orthopedics.

## 2021-02-18 NOTE — ED Triage Notes (Signed)
Pain in left shoulder for 4 days, hurts to move

## 2021-02-18 NOTE — ED Notes (Signed)
Patient transported to X-ray 

## 2021-02-23 ENCOUNTER — Ambulatory Visit: Payer: Medicare Other | Admitting: Orthopaedic Surgery

## 2021-02-25 ENCOUNTER — Other Ambulatory Visit: Payer: Self-pay

## 2021-02-25 ENCOUNTER — Encounter: Payer: Self-pay | Admitting: Orthopaedic Surgery

## 2021-02-25 ENCOUNTER — Ambulatory Visit (INDEPENDENT_AMBULATORY_CARE_PROVIDER_SITE_OTHER): Payer: Medicare Other | Admitting: Orthopaedic Surgery

## 2021-02-25 VITALS — Ht 66.0 in | Wt 128.0 lb

## 2021-02-25 DIAGNOSIS — G8929 Other chronic pain: Secondary | ICD-10-CM | POA: Diagnosis not present

## 2021-02-25 DIAGNOSIS — M25511 Pain in right shoulder: Secondary | ICD-10-CM | POA: Diagnosis not present

## 2021-02-25 DIAGNOSIS — M25512 Pain in left shoulder: Secondary | ICD-10-CM | POA: Diagnosis not present

## 2021-02-25 DIAGNOSIS — F17210 Nicotine dependence, cigarettes, uncomplicated: Secondary | ICD-10-CM | POA: Diagnosis not present

## 2021-02-25 MED ORDER — HYDROCODONE-ACETAMINOPHEN 5-325 MG PO TABS: ORAL_TABLET | ORAL | 0 refills | Status: DC

## 2021-02-25 MED ORDER — NAPROXEN 500 MG PO TABS: 500.0000 mg | ORAL_TABLET | Freq: Two times a day (BID) | ORAL | 5 refills | Status: DC

## 2021-02-25 NOTE — Progress Notes (Signed)
Patient Aaron Spence, male DOB:1976/08/31, 45 y.o. WSF:[id number]  Chief Complaint  Patient presents with  . Shoulder Pain    Right shoulder pain, Patient reports that  the left shoulder pain for a few days.     HPI  Aaron Spence is a 45 y.o. male who has chronic pain of the right shoulder.  He went to ER and had X-rays of the left shoulder several days ago.  His shoulder is better.  He has no new trauma.  He has no swelling.  I will add Naprosyn.  I will refill pain medicine.  I have independently reviewed and interpreted x-rays of this patient done at another site by another physician or qualified health professional.     Body mass index is 20.66 kg/m.  ROS  Review of Systems  Constitutional:       Patient does not have Diabetes Mellitus. Patient has hypertension. Patient has COPD or shortness of breath. Patient does not have BMI > 35. Patient has current smoking history.  HENT: Negative for congestion.   Respiratory: Positive for cough and shortness of breath.   Cardiovascular: Negative for chest pain.  Endocrine: Positive for cold intolerance.  Genitourinary: Hematuria: .dx.  Musculoskeletal: Positive for arthralgias.  Allergic/Immunologic: Negative for environmental allergies.  All other systems reviewed and are negative.   All other systems reviewed and are negative.  The following is a summary of the past history medically, past history surgically, known current medicines, social history and family history.  This information is gathered electronically by the computer from prior information and documentation.  I review this each visit and have found including this information at this point in the chart is beneficial and informative.    Past Medical History:  Diagnosis Date  . Bronchitis   . Cancer ([place])   . Tobacco use     History reviewed. No pertinent surgical history.  Family History  Problem Relation Age of Onset  . Bronchiolitis Other      Social History Social History   Tobacco Use  . Smoking status: Current Every Day Smoker    Packs/day: 1.50    Years: 20.00    Pack years: 30.00    Types: Cigarettes  . Smokeless tobacco: Never Used  Vaping Use  . Vaping Use: Never used  Substance Use Topics  . Alcohol use: No    Comment: occassional  . Drug use: No    No Known Allergies  Current Outpatient Medications  Medication Sig Dispense Refill  . naproxen (NAPROSYN) 500 MG tablet Take 1 tablet (500 mg total) by mouth 2 (two) times daily with a meal. 60 tablet 5  . HYDROcodone-acetaminophen (NORCO/VICODIN) 5-325 MG tablet One tablet every six hours for pain.  Limit 7 days. 25 tablet 0   No current facility-administered medications for this visit.     Physical Exam  Height 5\' 6"  (1.676 m), weight 128 lb (58.1 kg).  Constitutional: overall normal hygiene, normal nutrition, well developed, normal grooming, normal body habitus. Assistive device:none  Musculoskeletal: gait and station Limp none, muscle tone and strength are normal, no tremors or atrophy is present.  .  Neurological: coordination overall normal.  Deep tendon reflex/nerve stretch intact.  Sensation normal.  Cranial nerves II-XII intact.   Skin:   Normal overall no scars, lesions, ulcers or rashes. No psoriasis.  Psychiatric: Alert and oriented x 3.  Recent memory intact, remote memory unclear.  Normal mood and affect. Well groomed.  Good eye contact.  Cardiovascular:  overall no swelling, no varicosities, no edema bilaterally, normal temperatures of the legs and arms, no clubbing, cyanosis and good capillary refill.  Lymphatic: palpation is normal.  ROM of both shoulders is full but tender in the extremes.  NV intact.  All other systems reviewed and are negative   The patient has been educated about the nature of the problem(s) and counseled on treatment options.  The patient appeared to understand what I have discussed and is in agreement with  it.  Encounter Diagnoses  Name Primary?  . Chronic right shoulder pain Yes  . Chronic left shoulder pain   . Cigarette nicotine dependence without complication     PLAN Call if any problems.  Precautions discussed.  Continue current medications.   Return to clinic 3 months   I have called in Naprosyn 500 po bid pc  Stop the ibuprofen.  I have reviewed the [place] web site prior to prescribing narcotic medicine for this patient.   Electronically Signed Sanjuana Kava, MD 4/28/202210:09 AM

## 2021-03-12 NOTE — Progress Notes (Signed)
This encounter was created in error - please disregard.

## 2021-05-10 ENCOUNTER — Telehealth: Payer: Self-pay | Admitting: Orthopaedic Surgery

## 2021-05-10 NOTE — Telephone Encounter (Signed)
Patient requests refill HYDROcodone-acetaminophen (NORCO/VICODIN) 5-325 MG tablet 25 tablet   [facility], [address] [place]

## 2021-05-11 MED ORDER — HYDROCODONE-ACETAMINOPHEN 5-325 MG PO TABS: ORAL_TABLET | ORAL | 0 refills | Status: DC

## 2021-05-25 ENCOUNTER — Ambulatory Visit: Payer: Medicare Other | Admitting: Orthopaedic Surgery

## 2021-05-27 ENCOUNTER — Ambulatory Visit: Payer: Medicare Other | Admitting: Orthopaedic Surgery

## 2021-05-27 ENCOUNTER — Encounter: Payer: Self-pay | Admitting: Orthopaedic Surgery

## 2021-06-17 ENCOUNTER — Ambulatory Visit (INDEPENDENT_AMBULATORY_CARE_PROVIDER_SITE_OTHER): Payer: Medicare Other | Admitting: Orthopaedic Surgery

## 2021-06-17 ENCOUNTER — Encounter: Payer: Self-pay | Admitting: Orthopaedic Surgery

## 2021-06-17 ENCOUNTER — Other Ambulatory Visit: Payer: Self-pay

## 2021-06-17 VITALS — BP 111/73 | HR 82 | Ht 66.0 in | Wt 128.2 lb

## 2021-06-17 DIAGNOSIS — F17210 Nicotine dependence, cigarettes, uncomplicated: Secondary | ICD-10-CM

## 2021-06-17 DIAGNOSIS — M25511 Pain in right shoulder: Secondary | ICD-10-CM | POA: Diagnosis not present

## 2021-06-17 DIAGNOSIS — G8929 Other chronic pain: Secondary | ICD-10-CM | POA: Diagnosis not present

## 2021-06-17 DIAGNOSIS — J40 Bronchitis, not specified as acute or chronic: Secondary | ICD-10-CM | POA: Diagnosis not present

## 2021-06-17 DIAGNOSIS — F17200 Nicotine dependence, unspecified, uncomplicated: Secondary | ICD-10-CM | POA: Diagnosis not present

## 2021-06-17 DIAGNOSIS — H6693 Otitis media, unspecified, bilateral: Secondary | ICD-10-CM | POA: Diagnosis not present

## 2021-06-17 MED ORDER — HYDROCODONE-ACETAMINOPHEN 5-325 MG PO TABS: ORAL_TABLET | ORAL | 0 refills | Status: DC

## 2021-06-17 NOTE — Progress Notes (Signed)
He has chronic pain right shoulder.  He has no new trauma.  He is taking his naprosyn with no ill effects. Examination of right Upper Extremity is done.  Inspection:   Overall:  Elbow non-tender without crepitus or defects, forearm non-tender without crepitus or defects, wrist non-tender without crepitus or defects, hand non-tender.    Shoulder: with glenohumeral joint tenderness, without effusion.   Upper arm:  without swelling and tenderness   Range of motion:   Overall:  Full range of motion of the elbow, full range of motion of wrist and full range of motion in fingers.   Shoulder:  right  145 degrees forward flexion; 120 degrees abduction; 30 degrees internal rotation, 30 degrees external rotation, 15 degrees extension, 40 degrees adduction.   Stability:   Overall:  Shoulder, elbow and wrist stable   Strength and Tone:   Overall full shoulder muscles strength, full upper arm strength and normal upper arm bulk and tone.   Encounter Diagnoses  Name Primary?   Chronic right shoulder pain Yes   Cigarette nicotine dependence without complication    Return in three months.  I have reviewed the [place] web site prior to prescribing narcotic medicine for this patient.  Call if any problem.  Precautions discussed.  Electronically Signed Sanjuana Kava, MD 8/18/20229:54 AM

## 2021-06-22 DIAGNOSIS — E049 Nontoxic goiter, unspecified: Secondary | ICD-10-CM | POA: Diagnosis not present

## 2021-06-22 DIAGNOSIS — Z0001 Encounter for general adult medical examination with abnormal findings: Secondary | ICD-10-CM | POA: Diagnosis not present

## 2021-06-22 DIAGNOSIS — R7989 Other specified abnormal findings of blood chemistry: Secondary | ICD-10-CM | POA: Diagnosis not present

## 2021-06-30 DIAGNOSIS — F17210 Nicotine dependence, cigarettes, uncomplicated: Secondary | ICD-10-CM | POA: Diagnosis not present

## 2021-06-30 DIAGNOSIS — J42 Unspecified chronic bronchitis: Secondary | ICD-10-CM | POA: Diagnosis not present

## 2021-06-30 DIAGNOSIS — H6690 Otitis media, unspecified, unspecified ear: Secondary | ICD-10-CM | POA: Diagnosis not present

## 2021-07-26 DIAGNOSIS — H900 Conductive hearing loss, bilateral: Secondary | ICD-10-CM | POA: Diagnosis not present

## 2021-07-26 DIAGNOSIS — H6123 Impacted cerumen, bilateral: Secondary | ICD-10-CM | POA: Diagnosis not present

## 2021-08-11 ENCOUNTER — Telehealth: Payer: Self-pay | Admitting: Orthopaedic Surgery

## 2021-08-11 NOTE — Telephone Encounter (Signed)
Patient requests refill: HYDROcodone-acetaminophen (NORCO/VICODIN) 5-325 MG tablet 25 tablet    [facility], [place], [place]

## 2021-08-12 MED ORDER — HYDROCODONE-ACETAMINOPHEN 5-325 MG PO TABS: ORAL_TABLET | ORAL | 0 refills | Status: DC

## 2021-08-19 ENCOUNTER — Telehealth: Payer: Self-pay

## 2021-08-19 MED ORDER — HYDROCODONE-ACETAMINOPHEN 5-325 MG PO TABS: ORAL_TABLET | ORAL | 0 refills | Status: DC

## 2021-08-19 NOTE — Telephone Encounter (Signed)
Hydrocodone-Acetaminophen 5/325 mg  Qty 24 Tablets   PATIENT USES [facility] [address]

## 2021-09-16 ENCOUNTER — Ambulatory Visit: Payer: Medicare Other | Admitting: Orthopaedic Surgery

## 2021-09-21 ENCOUNTER — Ambulatory Visit: Payer: Medicare Other | Admitting: Orthopaedic Surgery

## 2021-10-07 ENCOUNTER — Ambulatory Visit: Payer: Medicare Other | Admitting: Orthopaedic Surgery

## 2021-10-12 ENCOUNTER — Telehealth: Payer: Self-pay

## 2021-10-12 ENCOUNTER — Ambulatory Visit: Payer: Medicare Other | Admitting: Orthopaedic Surgery

## 2021-10-12 NOTE — Telephone Encounter (Signed)
Aaron Spence called to reschedule his appointment that he no showed for this morning. I explained to him that he wouldn't be able to get anymore pain medication until he comes in for a visit, that by law Dr. Luna Glasgow has to see him in person in order to continue his pain medication. He stated he understood and rescheduled for Thursday 10/14/21.

## 2021-10-14 ENCOUNTER — Encounter: Payer: Self-pay | Admitting: Orthopaedic Surgery

## 2021-10-14 ENCOUNTER — Telehealth: Payer: Self-pay | Admitting: Orthopaedic Surgery

## 2021-10-14 ENCOUNTER — Ambulatory Visit: Payer: Medicare Other | Admitting: Orthopaedic Surgery

## 2021-10-14 NOTE — Telephone Encounter (Signed)
Aaron Spence,   Patient called at 3:18 and left a message stating he had a family emergency that is why he didn't make it to his appt this morning.    Called patinet back and advised him to call back on Tuesday and speak with [facility].  He has no showed several times, he said ok he will.

## 2021-10-28 DIAGNOSIS — J40 Bronchitis, not specified as acute or chronic: Secondary | ICD-10-CM | POA: Diagnosis not present

## 2021-10-28 DIAGNOSIS — M549 Dorsalgia, unspecified: Secondary | ICD-10-CM | POA: Diagnosis not present

## 2021-10-28 DIAGNOSIS — F17210 Nicotine dependence, cigarettes, uncomplicated: Secondary | ICD-10-CM | POA: Diagnosis not present

## 2021-10-28 DIAGNOSIS — Z1389 Encounter for screening for other disorder: Secondary | ICD-10-CM | POA: Diagnosis not present

## 2021-10-28 DIAGNOSIS — Z0001 Encounter for general adult medical examination with abnormal findings: Secondary | ICD-10-CM | POA: Diagnosis not present

## 2021-11-04 ENCOUNTER — Ambulatory Visit (INDEPENDENT_AMBULATORY_CARE_PROVIDER_SITE_OTHER): Payer: Medicare Other | Admitting: Orthopaedic Surgery

## 2021-11-04 ENCOUNTER — Encounter: Payer: Self-pay | Admitting: Orthopaedic Surgery

## 2021-11-04 ENCOUNTER — Other Ambulatory Visit: Payer: Self-pay

## 2021-11-04 VITALS — Ht 66.0 in | Wt 128.0 lb

## 2021-11-04 DIAGNOSIS — G8929 Other chronic pain: Secondary | ICD-10-CM

## 2021-11-04 DIAGNOSIS — F17210 Nicotine dependence, cigarettes, uncomplicated: Secondary | ICD-10-CM

## 2021-11-04 DIAGNOSIS — M25511 Pain in right shoulder: Secondary | ICD-10-CM

## 2021-11-04 DIAGNOSIS — M545 Low back pain, unspecified: Secondary | ICD-10-CM | POA: Diagnosis not present

## 2021-11-04 MED ORDER — HYDROCODONE-ACETAMINOPHEN 5-325 MG PO TABS: ORAL_TABLET | ORAL | 0 refills | Status: DC

## 2021-11-04 NOTE — Progress Notes (Signed)
My shoulder hurts more with the cold weather.  He has had more right shoulder pain with the cold weather.  He has no trauma.  He has some lower back pain as well, no trauma, no paresthesias.  Right shoulder has full motion with pain in the extremes.  Spine/Pelvis examination:  Inspection:  Overall, sacoiliac joint benign and hips nontender; without crepitus or defects.   Thoracic spine inspection: Alignment normal without kyphosis present   Lumbar spine inspection:  Alignment  with normal lumbar lordosis, without scoliosis apparent.   Thoracic spine palpation:  without tenderness of spinal processes   Lumbar spine palpation: without tenderness of lumbar area; without tightness of lumbar muscles    Range of Motion:   Lumbar flexion, forward flexion is normal without pain or tenderness    Lumbar extension is full without pain or tenderness   Left lateral bend is normal without pain or tenderness   Right lateral bend is normal without pain or tenderness   Straight leg raising is normal  Strength & tone: normal   Stability overall normal stability  Encounter Diagnoses  Name Primary?   Chronic right shoulder pain Yes   Cigarette nicotine dependence without complication    Lumbar pain    I have reviewed the [place] web site prior to prescribing narcotic medicine for this patient.  Return prn. Call if any problem.  Precautions discussed.  Electronically Signed Sanjuana Kava, MD 1/5/202311:58 AM

## 2021-12-26 ENCOUNTER — Emergency Department (HOSPITAL_COMMUNITY): Payer: Medicare Other

## 2021-12-26 ENCOUNTER — Emergency Department (HOSPITAL_COMMUNITY)
Admission: EM | Admit: 2021-12-26 | Discharge: 2021-12-26 | Disposition: A | Discharge status: Home / Self Care | Payer: Medicare Other | Attending: Emergency Medicine | Admitting: Emergency Medicine

## 2021-12-26 ENCOUNTER — Other Ambulatory Visit: Payer: Self-pay

## 2021-12-26 ENCOUNTER — Encounter (HOSPITAL_COMMUNITY): Payer: Self-pay | Admitting: Emergency Medicine

## 2021-12-26 DIAGNOSIS — S29011A Strain of muscle and tendon of front wall of thorax, initial encounter: Secondary | ICD-10-CM | POA: Insufficient documentation

## 2021-12-26 DIAGNOSIS — X500XXA Overexertion from strenuous movement or load, initial encounter: Secondary | ICD-10-CM | POA: Diagnosis not present

## 2021-12-26 DIAGNOSIS — R0789 Other chest pain: Secondary | ICD-10-CM | POA: Diagnosis not present

## 2021-12-26 DIAGNOSIS — T148XXA Other injury of unspecified body region, initial encounter: Secondary | ICD-10-CM

## 2021-12-26 DIAGNOSIS — S299XXA Unspecified injury of thorax, initial encounter: Secondary | ICD-10-CM | POA: Diagnosis present

## 2021-12-26 MED ORDER — IBUPROFEN 800 MG PO TABS: 800.0000 mg | ORAL_TABLET | Freq: Three times a day (TID) | ORAL | 0 refills | Status: DC | PRN

## 2021-12-26 MED ORDER — HYDROCODONE-ACETAMINOPHEN 5-325 MG PO TABS
2.0000 | ORAL_TABLET | Freq: Once | ORAL | Status: AC
  Administered 2021-12-26: 2 via ORAL
  Filled 2021-12-26: qty 2

## 2021-12-26 MED ORDER — HYDROCODONE-ACETAMINOPHEN 5-325 MG PO TABS: 2.0000 | ORAL_TABLET | Freq: Four times a day (QID) | ORAL | 0 refills | Status: DC | PRN

## 2021-12-26 NOTE — ED Notes (Signed)
Patient transported to X-ray 

## 2021-12-26 NOTE — ED Triage Notes (Signed)
Pt c/o pain to left  upper side x 2 weeks after working. Worse with certain movements. Denies gu/gi symptoms. Lnbm x 2 days ago. Nad. Took oxycodone and helped.

## 2021-12-27 NOTE — ED Provider Notes (Signed)
[facility] EMERGENCY DEPARTMENT Provider Note   CSN: [id number] Arrival date & time: 12/26/21  1912     History  Chief Complaint  Patient presents with   Flank Pain    Aaron Spence is a 46 y.o. male.  Pt complains of pain to his right rib/chest area.  Pt reports he was at work and lifting and flet like something pulled in his side  2 weeks ago Pt reports he has pain in his low back and right shoulder pain which he sees Orthopaedist for.    The history is provided by the patient. No language interpreter was used.      Home Medications Prior to Admission medications   Medication Sig Start Date End Date Taking? Authorizing Provider  HYDROcodone-acetaminophen (NORCO/VICODIN) 5-325 MG tablet Take 2 tablets by mouth every 6 (six) hours as needed for moderate pain. 12/26/21 12/26/22 Yes Fransico Meadow, PA-C  ibuprofen (ADVIL) 800 MG tablet Take 1 tablet (800 mg total) by mouth every 8 (eight) hours as needed. 12/26/21  Yes Caryl Ada K, PA-C  naproxen (NAPROSYN) 500 MG tablet Take 1 tablet (500 mg total) by mouth 2 (two) times daily with a meal. 02/25/21   Sanjuana Kava, MD      Allergies    Patient has no known allergies.    Review of Systems   Review of Systems  All other systems reviewed and are negative.  Physical Exam Updated Vital Signs BP (!) 128/94 (BP Location: Right Arm)    Pulse 94    Temp 98.4 F (36.9 C) (Oral)    Resp 18    Ht 5\' 4"  (1.626 m)    Wt 56.9 kg    SpO2 99%    BMI 21.54 kg/m  Physical Exam Vitals and nursing note reviewed.  Constitutional:      Appearance: He is well-developed.  HENT:     Head: Normocephalic.  Cardiovascular:     Rate and Rhythm: Normal rate and regular rhythm.     Comments: Tender right lateral chest  pain with movement Pulmonary:     Effort: Pulmonary effort is normal.  Abdominal:     General: There is no distension.  Musculoskeletal:        General: Normal range of motion.     Cervical back: Normal range of motion.   Skin:    General: Skin is warm.  Neurological:     General: No focal deficit present.     Mental Status: He is alert and oriented to person, place, and time.    ED Results / Procedures / Treatments   Labs (all labs ordered are listed, but only abnormal results are displayed) Labs Reviewed - No data to display  EKG None  Radiology DG Chest 2 View  Result Date: 12/26/2021 CLINICAL DATA:  Left chest pain. EXAM: CHEST - 2 VIEW COMPARISON:  Portable chest 03/25/2020 FINDINGS: The heart size and mediastinal contours are within normal limits. Both lungs are hyperaerated but clear. The visualized skeletal structures are unremarkable apart from slight thoracic dextroscoliosis. IMPRESSION: No evidence of acute chest disease.  Stable hyperinflated chest. Electronically Signed   By: Telford Nab M.D.   On: 12/26/2021 21:59    Procedures Procedures    Medications Ordered in ED Medications  HYDROcodone-acetaminophen (NORCO/VICODIN) 5-325 MG per tablet 2 tablet (2 tablets Oral Given 12/26/21 2125)    ED Course/ Medical Decision Making/ A&P  Medical Decision Making Problems Addressed: Muscle strain of chest wall, initial encounter: acute illness or injury    Details: pain in right chest after lifting  Amount and/or Complexity of Data Reviewed Independent Historian: spouse External Data Reviewed: notes.    Details: Orthopasedist notes reviewed Radiology: ordered and independent interpretation performed. Decision-making details documented in ED Course.    Details: Chest xray  ordered, reviewed and interpreted,  Pt has some hyperaeration  Risk Prescription drug management. Risk Details: Pt reports he is out of pain medication.  I advised pt I will give limited amount.  Pt given rx for ibuprofen            Final Clinical Impression(s) / ED Diagnoses Final diagnoses:  Muscle strain  Muscle strain of chest wall, initial encounter    Rx / DC  Orders ED Discharge Orders          Ordered    HYDROcodone-acetaminophen (NORCO/VICODIN) 5-325 MG tablet  Every 6 hours PRN        12/26/21 2245    ibuprofen (ADVIL) 800 MG tablet  Every 8 hours PRN        12/26/21 2245           An After Visit Summary was printed and given to the patient.    Fransico Meadow, [place] 12/27/21 1641    Dorie Rank, MD 12/28/21 0830

## 2022-01-03 ENCOUNTER — Telehealth: Payer: Self-pay | Admitting: Orthopaedic Surgery

## 2022-01-03 NOTE — Telephone Encounter (Signed)
Patient called to ask about scheduling for Emergency room visit for chest wall strain/pain. Discussed to see primary care or pulmonary specialist as orthopaedist does not treat. Understands and will call primary care. ?

## 2022-01-11 ENCOUNTER — Ambulatory Visit: Payer: Medicare Other | Admitting: Orthopaedic Surgery

## 2022-01-18 ENCOUNTER — Encounter: Payer: Self-pay | Admitting: Orthopaedic Surgery

## 2022-01-18 ENCOUNTER — Other Ambulatory Visit: Payer: Self-pay

## 2022-01-18 ENCOUNTER — Ambulatory Visit (INDEPENDENT_AMBULATORY_CARE_PROVIDER_SITE_OTHER): Payer: Medicare Other | Admitting: Orthopaedic Surgery

## 2022-01-18 VITALS — Ht 65.0 in | Wt 132.6 lb

## 2022-01-18 DIAGNOSIS — G8929 Other chronic pain: Secondary | ICD-10-CM | POA: Diagnosis not present

## 2022-01-18 DIAGNOSIS — M545 Low back pain, unspecified: Secondary | ICD-10-CM

## 2022-01-18 DIAGNOSIS — M25511 Pain in right shoulder: Secondary | ICD-10-CM

## 2022-01-18 MED ORDER — HYDROCODONE-ACETAMINOPHEN 5-325 MG PO TABS: ORAL_TABLET | ORAL | 0 refills | Status: DC

## 2022-01-18 NOTE — Progress Notes (Signed)
My back hurts today ? ?He has had more pain with the lower back over the last few weeks.  He has no trauma, no weakness, no paresthesias.  He is working at [employer] and is active.  He is out of his medicine. ? ?His right shoulder is tender but not hurting as much as before. ? ?Spine/Pelvis examination: ? Inspection:  Overall, sacoiliac joint benign and hips nontender; without crepitus or defects. ? ? Thoracic spine inspection: Alignment normal without kyphosis present ? ? Lumbar spine inspection:  Alignment  with normal lumbar lordosis, without scoliosis apparent. ? ? Thoracic spine palpation:  without tenderness of spinal processes ? ? Lumbar spine palpation: without tenderness of lumbar area; without tightness of lumbar muscles  ? ? Range of Motion: ?  Lumbar flexion, forward flexion is normal without pain or tenderness  ?  Lumbar extension is full without pain or tenderness ?  Left lateral bend is normal without pain or tenderness ?  Right lateral bend is normal without pain or tenderness ?  Straight leg raising is normal ? Strength & tone: normal ? ? Stability overall normal stability ? ?Right shoulder has good ROM with pain in the extremes. ? ?Encounter Diagnoses  ?Name Primary?  ? Lumbar pain Yes  ? Chronic right shoulder pain   ? ?I will refill his pain medicine. ? ?I have reviewed the [place] web site prior to prescribing narcotic medicine for this patient. ? ?Return in three months. ? ?Call if any problem. ? ?Precautions discussed. ? ?Electronically Signed ?Sanjuana Kava, MD ?3/21/20231:59 PM ? ?

## 2022-01-21 DIAGNOSIS — M545 Low back pain, unspecified: Secondary | ICD-10-CM | POA: Diagnosis not present

## 2022-01-21 DIAGNOSIS — R0781 Pleurodynia: Secondary | ICD-10-CM | POA: Diagnosis not present

## 2022-01-21 DIAGNOSIS — J40 Bronchitis, not specified as acute or chronic: Secondary | ICD-10-CM | POA: Diagnosis not present

## 2022-01-30 ENCOUNTER — Emergency Department (HOSPITAL_COMMUNITY)
Admission: EM | Admit: 2022-01-30 | Discharge: 2022-01-31 | Disposition: A | Discharge status: Home / Self Care | Payer: Medicare Other | Attending: Emergency Medicine | Admitting: Emergency Medicine

## 2022-01-30 ENCOUNTER — Other Ambulatory Visit: Payer: Self-pay

## 2022-01-30 ENCOUNTER — Encounter (HOSPITAL_COMMUNITY): Payer: Self-pay | Admitting: Emergency Medicine

## 2022-01-30 DIAGNOSIS — K0889 Other specified disorders of teeth and supporting structures: Secondary | ICD-10-CM | POA: Diagnosis not present

## 2022-01-30 DIAGNOSIS — K029 Dental caries, unspecified: Secondary | ICD-10-CM | POA: Insufficient documentation

## 2022-01-30 DIAGNOSIS — R0789 Other chest pain: Secondary | ICD-10-CM | POA: Diagnosis not present

## 2022-01-30 MED ORDER — AMOXICILLIN 500 MG PO CAPS
500.0000 mg | ORAL_CAPSULE | Freq: Three times a day (TID) | ORAL | 0 refills | Status: DC
Start: 2022-01-30 — End: 2022-07-17

## 2022-01-30 MED ORDER — AMOXICILLIN 250 MG PO CAPS
500.0000 mg | ORAL_CAPSULE | Freq: Once | ORAL | Status: AC
  Administered 2022-01-30: 500 mg via ORAL
  Filled 2022-01-30: qty 2

## 2022-01-30 NOTE — ED Triage Notes (Signed)
Pt c/o lower right sided dental pain x 3-4 days  ?

## 2022-01-30 NOTE — ED Provider Notes (Signed)
?[facility] ?Provider Note ? ? ?CSN: [id number] ?Arrival date & time: 01/30/22  2227 ? ?  ? ?History ? ?Chief Complaint  ?Patient presents with  ? Dental Pain  ? ? ?Aaron Spence is a 46 y.o. male. ? ?Right lower dental pain for 3 or 4 days.  Has been using ibuprofen 800 mg and Orajel.  No facial swelling or fever. ? ? ?  ? ?Home Medications ?Prior to Admission medications   ?Medication Sig Start Date End Date Taking? Authorizing Provider  ?amoxicillin (AMOXIL) 500 MG capsule Take 1 capsule (500 mg total) by mouth 3 (three) times daily. 01/30/22  Yes Ron Beske, Gwenyth Allegra, MD  ?HYDROcodone-acetaminophen (NORCO/VICODIN) 5-325 MG tablet One tablet every six hours for pain.  Limit 7 days. 01/18/22   Sanjuana Kava, MD  ?ibuprofen (ADVIL) 800 MG tablet Take 1 tablet (800 mg total) by mouth every 8 (eight) hours as needed. ?Patient not taking: Reported on 01/18/2022 12/26/21   Fransico Meadow, PA-C  ?naproxen (NAPROSYN) 500 MG tablet Take 1 tablet (500 mg total) by mouth 2 (two) times daily with a meal. 02/25/21   Sanjuana Kava, MD  ?   ? ?Allergies    ?Patient has no known allergies.   ? ?Review of Systems   ?Review of Systems  ?HENT:  Positive for dental problem.   ? ?Physical Exam ?Updated Vital Signs ?Ht '5\' 5"'$  (1.651 m)   Wt 59.9 kg   BMI 21.97 kg/m?  ?Physical Exam ?Vitals and nursing note reviewed.  ?Constitutional:   ?   General: He is not in acute distress. ?   Appearance: He is well-developed.  ?HENT:  ?   Head: Normocephalic and atraumatic.  ?   Mouth/Throat:  ?   Mouth: Mucous membranes are moist.  ?   Dentition: Dental tenderness and dental caries present.  ? ?Eyes:  ?   General: Vision grossly intact. Gaze aligned appropriately.  ?   Extraocular Movements: Extraocular movements intact.  ?   Conjunctiva/sclera: Conjunctivae normal.  ?Cardiovascular:  ?   Rate and Rhythm: Normal rate and regular rhythm.  ?   Pulses: Normal pulses.  ?   Heart sounds: Normal heart sounds, S1 normal and S2  normal. No murmur heard. ?  No friction rub. No gallop.  ?Pulmonary:  ?   Effort: Pulmonary effort is normal. No respiratory distress.  ?   Breath sounds: Normal breath sounds.  ?Abdominal:  ?   Palpations: Abdomen is soft.  ?   Tenderness: There is no abdominal tenderness. There is no guarding or rebound.  ?   Hernia: No hernia is present.  ?Musculoskeletal:     ?   General: No swelling.  ?   Cervical back: Full passive range of motion without pain, normal range of motion and neck supple. No pain with movement, spinous process tenderness or muscular tenderness. Normal range of motion.  ?   Right lower leg: No edema.  ?   Left lower leg: No edema.  ?Skin: ?   General: Skin is warm and dry.  ?   Capillary Refill: Capillary refill takes less than 2 seconds.  ?   Findings: No ecchymosis, erythema, lesion or wound.  ?Neurological:  ?   Mental Status: He is alert and oriented to person, place, and time.  ?   GCS: GCS eye subscore is 4. GCS verbal subscore is 5. GCS motor subscore is 6.  ?   Cranial Nerves: Cranial nerves 2-12 are intact.  ?  Sensory: Sensation is intact.  ?   Motor: Motor function is intact. No weakness or abnormal muscle tone.  ?   Coordination: Coordination is intact.  ?Psychiatric:     ?   Mood and Affect: Mood normal.     ?   Speech: Speech normal.     ?   Behavior: Behavior normal.  ? ? ?ED Results / Procedures / Treatments   ?Labs ?(all labs ordered are listed, but only abnormal results are displayed) ?Labs Reviewed - No data to display ? ?EKG ?None ? ?Radiology ?No results found. ? ?Procedures ?Procedures  ? ? ?Medications Ordered in ED ?Medications  ?amoxicillin (AMOXIL) capsule 500 mg (has no administration in time range)  ? ? ?ED Course/ Medical Decision Making/ A&P ?  ?                        ?Medical Decision Making ?Risk ?Prescription drug management. ? ? ?Complains of dental pain for 3 to 4 days.  Patient does have caries without signs of dental abscess. ? ? ? ? ? ? ? ?Final Clinical  Impression(s) / ED Diagnoses ?Final diagnoses:  ?Pain, dental  ? ? ?Rx / DC Orders ?ED Discharge Orders   ? ?      Ordered  ?  amoxicillin (AMOXIL) 500 MG capsule  3 times daily       ? 01/30/22 2330  ? ?  ?  ? ?  ? ? ?  ?Orpah Greek, MD ?01/30/22 2331 ? ?

## 2022-03-02 ENCOUNTER — Telehealth: Payer: Self-pay | Admitting: Orthopaedic Surgery

## 2022-03-02 NOTE — Telephone Encounter (Signed)
Patient called for refill: ?HYDROcodone-acetaminophen (NORCO/VICODIN) 5-325 MG tablet 22 tablet  ?     [place] ?

## 2022-03-03 MED ORDER — HYDROCODONE-ACETAMINOPHEN 5-325 MG PO TABS: ORAL_TABLET | ORAL | 0 refills | Status: DC

## 2022-04-12 ENCOUNTER — Ambulatory Visit (INDEPENDENT_AMBULATORY_CARE_PROVIDER_SITE_OTHER): Payer: Medicare Other | Admitting: Orthopaedic Surgery

## 2022-04-12 ENCOUNTER — Encounter: Payer: Self-pay | Admitting: Orthopaedic Surgery

## 2022-04-12 VITALS — Ht 66.0 in | Wt 128.1 lb

## 2022-04-12 DIAGNOSIS — M545 Low back pain, unspecified: Secondary | ICD-10-CM

## 2022-04-12 DIAGNOSIS — F17210 Nicotine dependence, cigarettes, uncomplicated: Secondary | ICD-10-CM

## 2022-04-12 MED ORDER — HYDROCODONE-ACETAMINOPHEN 5-325 MG PO TABS: ORAL_TABLET | ORAL | 0 refills | Status: DC

## 2022-04-12 NOTE — Patient Instructions (Signed)
Smoking Tobacco Information, Adult Smoking tobacco can be harmful to your health. Tobacco contains a toxic colorless chemical called nicotine. Nicotine causes changes in your brain that make you want more and more. This is called addiction. This can make it hard to stop smoking once you start. Tobacco also has other toxic chemicals that can hurt your body and raise your risk of many cancers. Menthol or "lite" tobacco or cigarette brands are not safer than regular brands. How can smoking tobacco affect me? Smoking tobacco puts you at risk for: Cancer. Smoking is most commonly associated with lung cancer, but can also lead to cancer in other parts of the body. Chronic obstructive pulmonary disease (COPD). This is a long-term lung condition that makes it hard to breathe. It also gets worse over time. High blood pressure (hypertension), heart disease, stroke, heart attack, and lung infections, such as pneumonia. Cataracts. This is when the lenses in the eyes become clouded. Digestive problems. This may include peptic ulcers, heartburn, and gastroesophageal reflux disease (GERD). Oral health problems, such as gum disease, mouth sores, and tooth loss. Loss of taste and smell. Smoking also affects how you look and smell. Smoking may cause: Wrinkles. Yellow or stained teeth, fingers, and fingernails. Bad breath. Bad-smelling clothes and hair. Smoking tobacco can also affect your social life, because: It may be challenging to find places to smoke when away from home. Many workplaces, restaurants, hotels, and public places are tobacco-free. Smoking is expensive. This is due to the cost of tobacco and the long-term costs of treating health problems from smoking. Secondhand smoke may affect those around you. Secondhand smoke can cause lung cancer, breathing problems, and heart disease. Children of smokers have a higher risk for: Sudden infant death syndrome (SIDS). Ear infections. Lung infections. What  actions can I take to prevent health problems? Quit smoking  Do not start smoking. Quit if you already smoke. Do not replace cigarette smoking with vaping devices, such as e-cigarettes. Make a plan to quit smoking and commit to it. Look for programs to help you, and ask your health care provider for recommendations and ideas. Set a date and write down all the reasons you want to quit. Let your friends and family know you are quitting so they can help and support you. Consider finding friends who also want to quit. It can be easier to quit with someone else, so that you can support each other. Talk with your health care provider about using nicotine replacement medicines to help you quit. These include gum, lozenges, patches, sprays, or pills. If you try to quit but return to smoking, stay positive. It is common to slip up when you first quit, so take it one day at a time. Be prepared for cravings. When you feel the urge to smoke, chew gum or suck on hard candy. Lifestyle Stay busy. Take care of your body. Get plenty of exercise, eat a healthy diet, and drink plenty of water. Find ways to manage your stress, such as meditation, yoga, exercise, or time spent with friends and family. Ask your health care provider about having regular tests (screenings) to check for cancer. This may include blood tests, imaging tests, and other tests. Where to find support To get support to quit smoking, consider: Asking your health care provider for more information and resources. Joining a support group for people who want to quit smoking in your local community. There are many effective programs that may help you to quit. Calling the smokefree.gov counselor   helpline at 1-800-QUIT-NOW (1-800-784-8669). Where to find more information You may find more information about quitting smoking from: Centers for Disease Control and Prevention: cdc.gov/tobacco Smokefree.gov: smokefree.gov American Lung Association:  freedomfromsmoking.org Contact a health care provider if: You have problems breathing. Your lips, nose, or fingers turn blue. You have chest pain. You are coughing up blood. You feel like you will faint. You have other health changes that cause you to worry. Summary Smoking tobacco can negatively affect your health, the health of those around you, your finances, and your social life. Do not start smoking. Quit if you already smoke. If you need help quitting, ask your health care provider. Consider joining a support group for people in your local community who want to quit smoking. There are many effective programs that may help you to quit. This information is not intended to replace advice given to you by your health care provider. Make sure you discuss any questions you have with your health care provider. Document Revised: 10/12/2021 Document Reviewed: 10/12/2021 Elsevier Patient Education  2023 Elsevier Inc.  

## 2022-04-12 NOTE — Progress Notes (Signed)
My back is tender  He has good and bad days with his lower back. He is working still at [employer] and is very active.  He is doing his exercises.  He has no trauma, no weakness, no paresthesias.  Spine/Pelvis examination:  Inspection:  Overall, sacoiliac joint benign and hips nontender; without crepitus or defects.   Thoracic spine inspection: Alignment normal without kyphosis present   Lumbar spine inspection:  Alignment  with normal lumbar lordosis, without scoliosis apparent.   Thoracic spine palpation:  without tenderness of spinal processes   Lumbar spine palpation: without tenderness of lumbar area; without tightness of lumbar muscles    Range of Motion:   Lumbar flexion, forward flexion is normal without pain or tenderness    Lumbar extension is full without pain or tenderness   Left lateral bend is normal without pain or tenderness   Right lateral bend is normal without pain or tenderness   Straight leg raising is normal  Strength & tone: normal   Stability overall normal stability  Encounter Diagnoses  Name Primary?   Lumbar pain Yes   Cigarette nicotine dependence without complication    I have reviewed the [place] web site prior to prescribing narcotic medicine for this patient.  Return in three months.  Continue exercises.  Call if any problem.  Precautions discussed.  Electronically Signed Sanjuana Kava, MD 6/13/20232:19 PM

## 2022-04-13 ENCOUNTER — Emergency Department (HOSPITAL_COMMUNITY)
Admission: EM | Admit: 2022-04-13 | Discharge: 2022-04-13 | Disposition: A | Discharge status: Home / Self Care | Payer: Medicare Other | Attending: Emergency Medicine | Admitting: Emergency Medicine

## 2022-04-13 ENCOUNTER — Encounter (HOSPITAL_COMMUNITY): Payer: Self-pay | Admitting: *Deleted

## 2022-04-13 ENCOUNTER — Other Ambulatory Visit: Payer: Self-pay

## 2022-04-13 ENCOUNTER — Emergency Department (HOSPITAL_COMMUNITY): Payer: Medicare Other

## 2022-04-13 DIAGNOSIS — Y990 Civilian activity done for income or pay: Secondary | ICD-10-CM | POA: Insufficient documentation

## 2022-04-13 DIAGNOSIS — G8929 Other chronic pain: Secondary | ICD-10-CM | POA: Diagnosis not present

## 2022-04-13 DIAGNOSIS — M5459 Other low back pain: Secondary | ICD-10-CM | POA: Diagnosis not present

## 2022-04-13 DIAGNOSIS — M545 Low back pain, unspecified: Secondary | ICD-10-CM | POA: Insufficient documentation

## 2022-04-13 DIAGNOSIS — X500XXA Overexertion from strenuous movement or load, initial encounter: Secondary | ICD-10-CM | POA: Insufficient documentation

## 2022-04-13 MED ORDER — NAPROXEN 375 MG PO TABS: 375.0000 mg | ORAL_TABLET | Freq: Two times a day (BID) | ORAL | 0 refills | Status: DC | PRN

## 2022-04-13 MED ORDER — HYDROCODONE-ACETAMINOPHEN 5-325 MG PO TABS
1.0000 | ORAL_TABLET | Freq: Once | ORAL | Status: AC
  Administered 2022-04-13: 1 via ORAL
  Filled 2022-04-13: qty 1

## 2022-04-13 NOTE — ED Triage Notes (Signed)
Pt with c/o lower back. Seen Dr. Luna Glasgow yesterday for his back pain.

## 2022-04-13 NOTE — Discharge Instructions (Signed)
Your x-ray of your lower back is reassuring with no obvious bony problems with your back.  Continue using the hydrocodone you are prescribed from Dr. Luna Glasgow as needed.  Have also prescribed an anti-inflammatory called naproxen which may also help you with your pain.  Take this medication with food or snack.

## 2022-04-13 NOTE — ED Provider Notes (Signed)
[facility] EMERGENCY DEPARTMENT Provider Note   CSN: [id number] Arrival date & time: 04/13/22  1804     History  Chief Complaint  Patient presents with   Back Pain    Aaron Spence is a 46 y.o. 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   There is no radiation of pain into either lower extremity.  There has been no weakness or numbness in the lower extremities and no urinary or bowel retention or incontinence.  Patient does not have a history of cancer or IVDU.  The patient takes hydrocodone at night for his chronic back pain but has had no medicines he can take during the day that won't cause drowsiness.    The history is provided by the patient.       Home Medications Prior to Admission medications   Medication Sig Start Date End Date Taking? Authorizing Provider  naproxen (NAPROSYN) 375 MG tablet Take 1 tablet (375 mg total) by mouth 2 (two) times daily as needed for moderate pain. 04/13/22  Yes Dillyn Joaquin, Almyra Free, PA-C  amoxicillin (AMOXIL) 500 MG capsule Take 1 capsule (500 mg total) by mouth 3 (three) times daily. 01/30/22   Orpah Greek, MD  HYDROcodone-acetaminophen (NORCO/VICODIN) 5-325 MG tablet One tablet every six hours for pain.  Limit 7 days. 04/12/22   Sanjuana Kava, MD      Allergies    Patient has no known allergies.    Review of Systems   Review of Systems  Constitutional:  Negative for fever.  Respiratory:  Negative for shortness of breath.   Cardiovascular:  Negative for chest pain and leg swelling.  Gastrointestinal:  Negative for abdominal distention, abdominal pain and constipation.  Genitourinary:  Negative for difficulty urinating, dysuria, flank pain, frequency and urgency.  Musculoskeletal:  Positive for back pain. Negative for gait problem and joint  swelling.  Skin:  Negative for rash.  Neurological:  Negative for weakness and numbness.    Physical Exam Updated Vital Signs BP 119/83 (BP Location: Right Arm)   Pulse 95   Temp 98 F (36.7 C) (Oral)   Resp 14   Ht '5\' 6"'$  (1.676 m)   Wt 56.7 kg   SpO2 100%   BMI 20.18 kg/m  Physical Exam Vitals and nursing note reviewed.  Constitutional:      Appearance: He is well-developed.  HENT:     Head: Normocephalic.  Eyes:     Conjunctiva/sclera: Conjunctivae normal.  Cardiovascular:     Rate and Rhythm: Normal rate.     Pulses: Normal pulses.  Pulmonary:     Effort: Pulmonary effort is normal.  Abdominal:     General: Bowel sounds are normal. There is no distension.     Palpations: Abdomen is soft. There is no mass.  Musculoskeletal:        General: Normal range of motion.     Cervical back: Normal range of motion and neck supple.     Lumbar back: Tenderness present. No swelling, edema or spasms.       Back:     Comments: Tender to palpation in the left lower lumbar region, there is also some tenderness along his left lateral spinal column.  There is no swelling or palpable deformity.  Skin:    General: Skin is  warm and dry.  Neurological:     Mental Status: He is alert.     Sensory: No sensory deficit.     Motor: No tremor or atrophy.     Gait: Gait normal.     Deep Tendon Reflexes:     Reflex Scores:      Patellar reflexes are 2+ on the right side and 2+ on the left side.    Comments: No strength deficit noted in hip and knee flexor and extensor muscle groups.  Ankle flexion and extension intact.     ED Results / Procedures / Treatments   Labs (all labs ordered are listed, but only abnormal results are displayed) Labs Reviewed - No data to display  EKG None  Radiology DG Lumbar Spine Complete  Result Date: 04/13/2022 CLINICAL DATA:  Pain. EXAM: LUMBAR SPINE - COMPLETE 4+ VIEW COMPARISON:  None Available. FINDINGS: There is no evidence of lumbar spine  fracture. Alignment is normal. Intervertebral disc spaces are maintained. IMPRESSION: Negative. Electronically Signed   By: Ronney Asters M.D.   On: 04/13/2022 22:47    Procedures Procedures    Medications Ordered in ED Medications  HYDROcodone-acetaminophen (NORCO/VICODIN) 5-325 MG per tablet 1 tablet (1 tablet Oral Given 04/13/22 2203)    ED Course/ Medical Decision Making/ A&P                           Medical Decision Making No neuro deficit on exam or by history to suggest emergent or surgical presentation.  No history or exam findings to suggest cauda equina or spinal abscess.  Discussed worsened sx that should prompt immediate re-evaluation including distal weakness, bowel/bladder retention/incontinence.  Patient was advised to continue his nighttime hydrocodone, but I also recommend heat therapy to his lower back, we discussed healthy ways to lift objects with minimizing strain on back.  Naproxen prescribed for daytime pain relief.  Advised parent follow-up with his PCP or Dr. Luna Glasgow who he sees for his chronic back pain problems.        Amount and/or Complexity of Data Reviewed Radiology: ordered and independent interpretation performed.    Details: Negative acute           Final Clinical Impression(s) / ED Diagnoses Final diagnoses:  Chronic left-sided low back pain without sciatica    Rx / DC Orders ED Discharge Orders          Ordered    naproxen (NAPROSYN) 375 MG tablet  2 times daily PRN        04/13/22 2259              Evalee Jefferson, PA-C 04/14/22 0003    Sherwood Gambler, MD 04/14/22 2132

## 2022-05-10 ENCOUNTER — Ambulatory Visit: Payer: Medicare Other | Admitting: Orthopaedic Surgery

## 2022-06-10 ENCOUNTER — Telehealth: Payer: Self-pay | Admitting: Orthopaedic Surgery

## 2022-06-10 NOTE — Telephone Encounter (Signed)
Patient called to confirm appointment for September, and to request refill: HYDROcodone-acetaminophen (NORCO/VICODIN) 5-325 MG tablet [employer], [place], [place]  (Patient aware Dr Luna Glasgow returns to clinic Tuesday, 06/14/22)

## 2022-06-13 MED ORDER — HYDROCODONE-ACETAMINOPHEN 5-325 MG PO TABS: ORAL_TABLET | ORAL | 0 refills | Status: DC

## 2022-07-12 ENCOUNTER — Ambulatory Visit (INDEPENDENT_AMBULATORY_CARE_PROVIDER_SITE_OTHER): Payer: Medicare Other | Admitting: Orthopaedic Surgery

## 2022-07-12 ENCOUNTER — Encounter: Payer: Self-pay | Admitting: Orthopaedic Surgery

## 2022-07-12 VITALS — Ht 67.0 in | Wt 127.0 lb

## 2022-07-12 DIAGNOSIS — F17210 Nicotine dependence, cigarettes, uncomplicated: Secondary | ICD-10-CM | POA: Diagnosis not present

## 2022-07-12 DIAGNOSIS — M545 Low back pain, unspecified: Secondary | ICD-10-CM

## 2022-07-12 MED ORDER — HYDROCODONE-ACETAMINOPHEN 5-325 MG PO TABS: ORAL_TABLET | ORAL | 0 refills | Status: DC

## 2022-07-12 MED ORDER — NAPROXEN 500 MG PO TABS: 500.0000 mg | ORAL_TABLET | Freq: Two times a day (BID) | ORAL | 5 refills | Status: DC

## 2022-07-12 NOTE — Patient Instructions (Signed)
Smoking Tobacco Information, Adult Smoking tobacco can be harmful to your health. Tobacco contains a toxic colorless chemical called nicotine. Nicotine causes changes in your brain that make you want more and more. This is called addiction. This can make it hard to stop smoking once you start. Tobacco also has other toxic chemicals that can hurt your body and raise your risk of many cancers. Menthol or "lite" tobacco or cigarette brands are not safer than regular brands. How can smoking tobacco affect me? Smoking tobacco puts you at risk for: Cancer. Smoking is most commonly associated with lung cancer, but can also lead to cancer in other parts of the body. Chronic obstructive pulmonary disease (COPD). This is a long-term lung condition that makes it hard to breathe. It also gets worse over time. High blood pressure (hypertension), heart disease, stroke, heart attack, and lung infections, such as pneumonia. Cataracts. This is when the lenses in the eyes become clouded. Digestive problems. This may include peptic ulcers, heartburn, and gastroesophageal reflux disease (GERD). Oral health problems, such as gum disease, mouth sores, and tooth loss. Loss of taste and smell. Smoking also affects how you look and smell. Smoking may cause: Wrinkles. Yellow or stained teeth, fingers, and fingernails. Bad breath. Bad-smelling clothes and hair. Smoking tobacco can also affect your social life, because: It may be challenging to find places to smoke when away from home. Many workplaces, restaurants, hotels, and public places are tobacco-free. Smoking is expensive. This is due to the cost of tobacco and the long-term costs of treating health problems from smoking. Secondhand smoke may affect those around you. Secondhand smoke can cause lung cancer, breathing problems, and heart disease. Children of smokers have a higher risk for: Sudden infant death syndrome (SIDS). Ear infections. Lung infections. What  actions can I take to prevent health problems? Quit smoking  Do not start smoking. Quit if you already smoke. Do not replace cigarette smoking with vaping devices, such as e-cigarettes. Make a plan to quit smoking and commit to it. Look for programs to help you, and ask your health care provider for recommendations and ideas. Set a date and write down all the reasons you want to quit. Let your friends and family know you are quitting so they can help and support you. Consider finding friends who also want to quit. It can be easier to quit with someone else, so that you can support each other. Talk with your health care provider about using nicotine replacement medicines to help you quit. These include gum, lozenges, patches, sprays, or pills. If you try to quit but return to smoking, stay positive. It is common to slip up when you first quit, so take it one day at a time. Be prepared for cravings. When you feel the urge to smoke, chew gum or suck on hard candy. Lifestyle Stay busy. Take care of your body. Get plenty of exercise, eat a healthy diet, and drink plenty of water. Find ways to manage your stress, such as meditation, yoga, exercise, or time spent with friends and family. Ask your health care provider about having regular tests (screenings) to check for cancer. This may include blood tests, imaging tests, and other tests. Where to find support To get support to quit smoking, consider: Asking your health care provider for more information and resources. Joining a support group for people who want to quit smoking in your local community. There are many effective programs that may help you to quit. Calling the smokefree.gov counselor   helpline at 1-800-QUIT-NOW (1-800-784-8669). Where to find more information You may find more information about quitting smoking from: Centers for Disease Control and Prevention: cdc.gov/tobacco Smokefree.gov: smokefree.gov American Lung Association:  freedomfromsmoking.org Contact a health care provider if: You have problems breathing. Your lips, nose, or fingers turn blue. You have chest pain. You are coughing up blood. You feel like you will faint. You have other health changes that cause you to worry. Summary Smoking tobacco can negatively affect your health, the health of those around you, your finances, and your social life. Do not start smoking. Quit if you already smoke. If you need help quitting, ask your health care provider. Consider joining a support group for people in your local community who want to quit smoking. There are many effective programs that may help you to quit. This information is not intended to replace advice given to you by your health care provider. Make sure you discuss any questions you have with your health care provider. Document Revised: 10/12/2021 Document Reviewed: 10/12/2021 Elsevier Patient Education  2023 Elsevier Inc.  

## 2022-07-12 NOTE — Progress Notes (Signed)
My back hurts.  He has lower back pain.  He is doing his exercises and is working.  He has no new trauma.  He has no weakness or paresthesias.  He is out of his Naprosyn.  Spine/Pelvis examination:  Inspection:  Overall, sacoiliac joint benign and hips nontender; without crepitus or defects.   Thoracic spine inspection: Alignment normal without kyphosis present   Lumbar spine inspection:  Alignment  with normal lumbar lordosis, without scoliosis apparent.   Thoracic spine palpation:  without tenderness of spinal processes   Lumbar spine palpation: without tenderness of lumbar area; without tightness of lumbar muscles    Range of Motion:   Lumbar flexion, forward flexion is normal without pain or tenderness    Lumbar extension is full without pain or tenderness   Left lateral bend is normal without pain or tenderness   Right lateral bend is normal without pain or tenderness   Straight leg raising is normal  Strength & tone: normal   Stability overall normal stability  Encounter Diagnoses  Name Primary?   Lumbar pain Yes   Cigarette nicotine dependence without complication    I have reviewed the [facility] web site prior to prescribing narcotic medicine for this patient.  I refilled his Naprosyn as well.  Return in three months.  Call if any problem.  Precautions discussed.  Electronically Signed Sanjuana Kava, MD 9/12/20234:00 PM

## 2022-07-16 ENCOUNTER — Emergency Department (HOSPITAL_COMMUNITY)
Admission: EM | Admit: 2022-07-16 | Discharge: 2022-07-17 | Disposition: A | Discharge status: Home / Self Care | Payer: Medicare Other | Attending: Emergency Medicine | Admitting: Emergency Medicine

## 2022-07-16 DIAGNOSIS — H6123 Impacted cerumen, bilateral: Secondary | ICD-10-CM | POA: Insufficient documentation

## 2022-07-17 ENCOUNTER — Encounter (HOSPITAL_COMMUNITY): Payer: Self-pay | Admitting: Emergency Medicine

## 2022-07-17 DIAGNOSIS — H6123 Impacted cerumen, bilateral: Secondary | ICD-10-CM | POA: Diagnosis not present

## 2022-07-17 MED ORDER — CARBAMIDE PEROXIDE 6.5 % OT SOLN
5.0000 [drp] | Freq: Once | OTIC | Status: AC
Start: 2022-07-17 — End: 2022-07-17
  Administered 2022-07-17: 5 [drp] via OTIC
  Filled 2022-07-17: qty 15

## 2022-07-17 NOTE — ED Provider Notes (Signed)
  [facility] EMERGENCY DEPARTMENT Provider Note   CSN: [id number] Arrival date & time: 07/16/22  2346     History  Chief Complaint  Patient presents with   Ear Fullness    Aaron Spence is a 46 y.o. male.  The history is provided by the patient.  Ear Fullness This is a new problem. The problem occurs constantly. Nothing aggravates the symptoms. Nothing relieves the symptoms.  Patient reports both of his ears feel that they are stopped up.  This been going on for the past day.  No ear pain.  No drainage     Home Medications Prior to Admission medications   Not on File      Allergies    Patient has no known allergies.    Review of Systems   Review of Systems  Physical Exam Updated Vital Signs BP 125/71   Pulse 92   Temp 97.9 F (36.6 C) (Oral)   Resp 16   Ht 1.702 m ('5\' 7"'$ )   Wt 57.6 kg   SpO2 98%   BMI 19.89 kg/m  Physical Exam CONSTITUTIONAL: Well developed/well nourished HEAD: Normocephalic/atraumatic EYES: EOMI/PERRL ENMT: Mucous membranes moist Bilateral cerumen impaction.  No mastoid tenderness.  Ears are symmetric NEURO: Pt is awake/alert/appropriate, moves all extremitiesx4.  No facial droop.   SKIN: warm, color normal PSYCH: no abnormalities of mood noted, alert and oriented to situation  ED Results / Procedures / Treatments   Labs (all labs ordered are listed, but only abnormal results are displayed) Labs Reviewed - No data to display  EKG None  Radiology No results found.  Procedures Procedures    Medications Ordered in ED Medications  carbamide peroxide (DEBROX) 6.5 % OTIC (EAR) solution 5 drop (5 drops Both EARS Given 07/17/22 0111)    ED Course/ Medical Decision Making/ A&P                           Medical Decision Making Risk OTC drugs.   Pt given debrox so he can use at homes. No other acute issues        Final Clinical Impression(s) / ED Diagnoses Final diagnoses:  Bilateral impacted cerumen    Rx / DC  Orders ED Discharge Orders     None         Ripley Fraise, MD 07/17/22 [phone]

## 2022-07-17 NOTE — ED Triage Notes (Signed)
Pt c/o bilateral ears stopped up since yesterday.

## 2022-08-20 ENCOUNTER — Encounter (HOSPITAL_COMMUNITY): Payer: Self-pay

## 2022-08-20 ENCOUNTER — Emergency Department (HOSPITAL_COMMUNITY)
Admission: EM | Admit: 2022-08-20 | Discharge: 2022-08-20 | Disposition: A | Discharge status: Home / Self Care | Payer: Medicare Other | Attending: Emergency Medicine | Admitting: Emergency Medicine

## 2022-08-20 ENCOUNTER — Other Ambulatory Visit: Payer: Self-pay

## 2022-08-20 ENCOUNTER — Emergency Department (HOSPITAL_COMMUNITY): Payer: Medicare Other

## 2022-08-20 DIAGNOSIS — B9789 Other viral agents as the cause of diseases classified elsewhere: Secondary | ICD-10-CM | POA: Diagnosis not present

## 2022-08-20 DIAGNOSIS — Z859 Personal history of malignant neoplasm, unspecified: Secondary | ICD-10-CM | POA: Insufficient documentation

## 2022-08-20 DIAGNOSIS — J069 Acute upper respiratory infection, unspecified: Secondary | ICD-10-CM | POA: Insufficient documentation

## 2022-08-20 DIAGNOSIS — J439 Emphysema, unspecified: Secondary | ICD-10-CM | POA: Diagnosis not present

## 2022-08-20 DIAGNOSIS — F17210 Nicotine dependence, cigarettes, uncomplicated: Secondary | ICD-10-CM | POA: Diagnosis not present

## 2022-08-20 DIAGNOSIS — Z1152 Encounter for screening for COVID-19: Secondary | ICD-10-CM | POA: Insufficient documentation

## 2022-08-20 DIAGNOSIS — R059 Cough, unspecified: Secondary | ICD-10-CM | POA: Diagnosis not present

## 2022-08-20 LAB — RESP PANEL BY RT-PCR (FLU A&B, COVID) ARPGX2
Influenza A by PCR: NEGATIVE
Influenza B by PCR: NEGATIVE
SARS Coronavirus 2 by RT PCR: NEGATIVE

## 2022-08-20 MED ORDER — IBUPROFEN 400 MG PO TABS
600.0000 mg | ORAL_TABLET | Freq: Once | ORAL | Status: AC
  Administered 2022-08-20: 600 mg via ORAL
  Filled 2022-08-20: qty 2

## 2022-08-20 NOTE — ED Provider Notes (Signed)
[facility] EMERGENCY DEPARTMENT Provider Note  CSN: [id number] Arrival date & time: 08/20/22 1911  Chief Complaint(s) URI  HPI Aaron Spence is a 46 y.o. male presenting with cough.  He reports cough for the past 2 to 3 days.  He reports associated congestion, runny nose.  Denies sore throat, fevers or chills.  Reports occasionally coughing up clear phlegm.  No shortness of breath, chest pain, fainting.    Past Medical History Past Medical History:  Diagnosis Date   Bronchitis    Cancer ([place])    Tobacco use    Patient Active Problem List   Diagnosis Date Noted   Right shoulder pain 12/08/2015   Pain in joint, shoulder region 05/05/2014   Muscle weakness (generalized) 05/05/2014   Home Medication(s) Prior to Admission medications   Not on File                                                                                                                                    Past Surgical History History reviewed. No pertinent surgical history. Family History Family History  Problem Relation Age of Onset   Bronchiolitis Other     Social History Social History   Tobacco Use   Smoking status: Every Day    Packs/day: 1.50    Years: 20.00    Total pack years: 30.00    Types: Cigarettes   Smokeless tobacco: Never  Vaping Use   Vaping Use: Never used  Substance Use Topics   Alcohol use: Yes    Comment: occassional   Drug use: Yes    Types: Marijuana    Comment: occas   Allergies Patient has no known allergies.  Review of Systems Review of Systems  All other systems reviewed and are negative.   Physical Exam Vital Signs  I have reviewed the triage vital signs BP 121/72 (BP Location: Right Arm)   Pulse 86   Temp 98.7 F (37.1 C) (Oral)   Resp 15   Ht '5\' 7"'$  (1.702 m)   Wt 57.2 kg   SpO2 99%   BMI 19.73 kg/m  Physical Exam Vitals and nursing note reviewed.  Constitutional:      General: He is not in acute distress.    Appearance: Normal  appearance.  HENT:     Mouth/Throat:     Mouth: Mucous membranes are moist.  Eyes:     Conjunctiva/sclera: Conjunctivae normal.  Cardiovascular:     Rate and Rhythm: Normal rate and regular rhythm.  Pulmonary:     Effort: Pulmonary effort is normal. No respiratory distress.     Breath sounds: Normal breath sounds.  Abdominal:     General: Abdomen is flat.     Palpations: Abdomen is soft.     Tenderness: There is no abdominal tenderness.  Musculoskeletal:     Right lower leg: No edema.     Left lower leg: No edema.  Skin:  General: Skin is warm and dry.     Capillary Refill: Capillary refill takes less than 2 seconds.  Neurological:     Mental Status: He is alert and oriented to person, place, and time. Mental status is at baseline.  Psychiatric:        Mood and Affect: Mood normal.        Behavior: Behavior normal.     ED Results and Treatments Labs (all labs ordered are listed, but only abnormal results are displayed) Labs Reviewed  RESP PANEL BY RT-PCR (FLU A&B, COVID) ARPGX2                                                                                                                          Radiology DG Chest 2 View  Result Date: 08/20/2022 CLINICAL DATA:  cough EXAM: CHEST - 2 VIEW COMPARISON:  Chest x-ray 12/26/2021 FINDINGS: The heart and mediastinal contours are within normal limits. Hyperinflated lungs. No focal consolidation. Chronic coarsened markings with no overt pulmonary edema. No pleural effusion. No pneumothorax. No acute osseous abnormality. IMPRESSION: 1. No active cardiopulmonary disease. 2.  Emphysema (ICD10-J43.9). Electronically Signed   By: Iven Finn M.D.   On: 08/20/2022 20:18    Pertinent labs & imaging results that were available during my care of the patient were reviewed by me and considered in my medical decision making (see MDM for details).  Medications Ordered in ED Medications  ibuprofen (ADVIL) tablet 600 mg (600 mg Oral Given  08/20/22 2008)                                                                                                                                     Procedures Procedures  (including critical care time)  Medical Decision Making / ED Course   MDM:  46 year old male presenting with likely viral URI.  Physical exam reassuring, lungs clear.  Vital signs reassuring, initial tachycardia improved on recheck.  Chest x-ray obtained given report of productive cough with no evidence of infiltrate.  COVID testing negative. Will discharge patient to home. All questions answered. Patient comfortable with plan of discharge. Return precautions discussed with patient and specified on the after visit summary.       Additional history obtained: -External records from outside source obtained and reviewed including: Chart review including previous notes, labs, imaging, consultation notes including last ED visit   Lab Tests: -I  ordered, reviewed, and interpreted labs.   The pertinent results include:   Labs Reviewed  RESP PANEL BY RT-PCR (FLU A&B, COVID) ARPGX2    Notable for negative COVID   Imaging Studies ordered: I ordered imaging studies including CXR On my interpretation imaging demonstrates clear lungs I independently visualized and interpreted imaging. I agree with the radiologist interpretation   Medicines ordered and prescription drug management: Meds ordered this encounter  Medications   ibuprofen (ADVIL) tablet 600 mg    -I have reviewed the patients home medicines and have made adjustments as needed   Social Determinants of Health:  Diagnosis or treatment significantly limited by social determinants of health: current smoker  Co morbidities that complicate the patient evaluation  Past Medical History:  Diagnosis Date   Bronchitis    Cancer ([place])    Tobacco use       Dispostion: Disposition decision including need for hospitalization was considered, and patient  discharged from emergency department.    Final Clinical Impression(s) / ED Diagnoses Final diagnoses:  Viral upper respiratory tract infection     This chart was dictated using voice recognition software.  Despite best efforts to proofread,  errors can occur which can change the documentation meaning.    Cristie Hem, MD 08/20/22 2030

## 2022-08-20 NOTE — ED Triage Notes (Signed)
Pt reports a cold for a week. Running nose, congestion.

## 2022-08-20 NOTE — ED Notes (Signed)
Patient transported to X-ray 

## 2022-08-23 ENCOUNTER — Other Ambulatory Visit: Payer: Self-pay

## 2022-08-23 ENCOUNTER — Encounter (HOSPITAL_COMMUNITY): Payer: Self-pay | Admitting: Emergency Medicine

## 2022-08-23 ENCOUNTER — Emergency Department (HOSPITAL_COMMUNITY)
Admission: EM | Admit: 2022-08-23 | Discharge: 2022-08-23 | Disposition: A | Discharge status: Home / Self Care | Payer: Medicare Other | Attending: Emergency Medicine | Admitting: Emergency Medicine

## 2022-08-23 DIAGNOSIS — R519 Headache, unspecified: Secondary | ICD-10-CM | POA: Diagnosis not present

## 2022-08-23 MED ORDER — DIPHENHYDRAMINE HCL 25 MG PO TABS: 25.0000 mg | ORAL_TABLET | Freq: Four times a day (QID) | ORAL | 0 refills | Status: DC | PRN

## 2022-08-23 MED ORDER — ACETAMINOPHEN 500 MG PO TABS
1000.0000 mg | ORAL_TABLET | Freq: Once | ORAL | Status: AC
  Administered 2022-08-23: 1000 mg via ORAL
  Filled 2022-08-23: qty 2

## 2022-08-23 MED ORDER — DIPHENHYDRAMINE HCL 25 MG PO CAPS
25.0000 mg | ORAL_CAPSULE | Freq: Once | ORAL | Status: AC
  Administered 2022-08-23: 25 mg via ORAL
  Filled 2022-08-23: qty 1

## 2022-08-23 NOTE — ED Triage Notes (Signed)
Pt c/o headache today. Nad. Pt c/o being recently dx with sinus problems. No neuro deficits noted. ambulatory

## 2022-08-23 NOTE — Discharge Instructions (Signed)
Your headache may be related to sinus headache.  Take the medication as directed.  You may also take ibuprofen or Tylenol if needed.  The diphenhydramine may cause drowsiness.  Do not drive or operate machinery while taking the medication.  Please follow-up with your primary care provider for recheck.  Return emergency department for any new or worsening symptoms.

## 2022-08-23 NOTE — ED Provider Notes (Signed)
[facility] EMERGENCY DEPARTMENT Provider Note   CSN: [id number] Arrival date & time: 08/23/22  1851     History  Chief Complaint  Patient presents with   Headache    Aaron Spence is a 46 y.o. male.   Headache Associated symptoms: sinus pressure   Associated symptoms: no abdominal pain, no cough, no dizziness, no ear pain, no fever, no nausea, no neck pain, no neck stiffness, no numbness, no sore throat, no vomiting and no weakness        Aaron Spence is a 46 y.o. male who presents to the Emergency Department complaining of frontal headache that began earlier today.  He was seen here 3 days ago diagnosed with viral URI.  Endorses having some nasal congestion and sinus pressure.  He notes gradual onset of diffuse frontal headache earlier today.  Describes headache as throbbing in quality.  Denies any visual changes neck pain or stiffness.  No numbness or weakness of the extremities.  No fever, nausea or vomiting.  He not tried any medication for symptom relief.  Home Medications Prior to Admission medications   Not on File      Allergies    Patient has no known allergies.    Review of Systems   Review of Systems  Constitutional:  Negative for appetite change, chills and fever.  HENT:  Positive for sinus pressure. Negative for ear pain, facial swelling, sore throat and trouble swallowing.   Respiratory:  Negative for cough and shortness of breath.   Cardiovascular:  Negative for chest pain.  Gastrointestinal:  Negative for abdominal pain, nausea and vomiting.  Musculoskeletal:  Negative for arthralgias, neck pain and neck stiffness.  Skin:  Negative for rash.  Neurological:  Positive for headaches. Negative for dizziness, syncope, speech difficulty, weakness and numbness.    Physical Exam Updated Vital Signs BP 131/79 (BP Location: Right Arm)   Pulse (!) 107   Temp 98.9 F (37.2 C) (Oral)   Resp 18   Ht '5\' 7"'$  (1.702 m)   Wt 56.7 kg   SpO2 99%   BMI 19.58  kg/m  Physical Exam Vitals and nursing note reviewed.  Constitutional:      General: He is not in acute distress.    Appearance: He is well-developed. He is not ill-appearing or toxic-appearing.  HENT:     Right Ear: Ear canal normal.     Left Ear: Tympanic membrane and ear canal normal.     Ears:     Comments: Unable to visualize right TM due to cerumen impaction.  No erythema or edema of the canals.    Nose: No rhinorrhea.  Eyes:     Extraocular Movements: Extraocular movements intact.     Conjunctiva/sclera: Conjunctivae normal.     Pupils: Pupils are equal, round, and reactive to light.  Neck:     Meningeal: Kernig's sign absent.  Cardiovascular:     Rate and Rhythm: Normal rate and regular rhythm.     Pulses: Normal pulses.  Pulmonary:     Effort: Pulmonary effort is normal. No respiratory distress.  Abdominal:     Palpations: Abdomen is soft.     Tenderness: There is no abdominal tenderness.  Musculoskeletal:        General: Normal range of motion.     Cervical back: Full passive range of motion without pain and normal range of motion. No rigidity.  Skin:    General: Skin is warm.     Capillary Refill: Capillary  refill takes less than 2 seconds.  Neurological:     General: No focal deficit present.     Mental Status: He is alert and oriented to person, place, and time.     GCS: GCS eye subscore is 4. GCS verbal subscore is 5. GCS motor subscore is 6.     Sensory: Sensation is intact. No sensory deficit.     Motor: Motor function is intact. No weakness.     Comments: CN II through XII intact.  Speech clear.  No pronator drift.  No facial droop.  He has 5 out of 5 strength in his resistance of the bilateral upper and lower extremities.     ED Results / Procedures / Treatments   Labs (all labs ordered are listed, but only abnormal results are displayed) Labs Reviewed - No data to display  EKG None  Radiology No results found.  Procedures Procedures     Medications Ordered in ED Medications  diphenhydrAMINE (BENADRYL) capsule 25 mg (has no administration in time range)  acetaminophen (TYLENOL) tablet 1,000 mg (has no administration in time range)    ED Course/ Medical Decision Making/ A&P                           Medical Decision Making Patient here for evaluation of gradual onset of frontal headache today.  Here 3 days ago for viral URI.  Endorses having sinus pressure and pain.  Has not taken anything today for symptomatic relief.  Denies any focal weakness, neck pain or stiffness.  No fever or chills.  On exam, patient well-appearing nontoxic.  No nuchal rigidity, neurovascularly intact.  Cranial nerves intact.  Differential diagnosis would include but not limited to tension headache, sinus related headache, subarachnoid hemorrhage, meningitis.  Clinically, he has a reassuring exam and I suspect this is a sinus related headache.  Amount and/or Complexity of Data Reviewed Discussion of management or test interpretation with external provider(s): Patient here with reassuring exam and most likely sinus related headache.  No nuchal rigidity, headache was gradual in onset.  No indication for LP or imaging at this time.  Will treat symptomatically.  He appears appropriate for discharge home, all questions were answered.  Strict return precautions were given.  Risk OTC drugs.           Final Clinical Impression(s) / ED Diagnoses Final diagnoses:  Sinus headache    Rx / DC Orders ED Discharge Orders     None         Kem Parkinson, PA-C 08/23/22 2047    Wyvonnia Dusky, MD 08/24/22 [phone]

## 2022-08-23 NOTE — ED Notes (Signed)
Pt c/o headache all day ( frontal and posterior). He has not tried any medications to relieve headache. Pt reports he has had an URI and still presents with chest congestion. Denies fever. Denies dizziness. Speech clear  and denies blurred vision. No obvious neuro deficits. Aaron Corona Edd Fabian

## 2022-08-30 ENCOUNTER — Telehealth: Payer: Self-pay

## 2022-08-30 NOTE — Telephone Encounter (Signed)
     Patient  visit on 10/24  at [facility] you been able to follow up with your primary care physician? YES  The patient was or was not able to obtain any needed medicine or equipment. YES  Are there diet recommendations that you are having difficulty following? NA  Patient expresses understanding of discharge instructions and education provided has no other needs at this time.  [place], [facility], Care Management  [phone] [address], [place], [place] [address] Phone: [phone] Email: [email or web address]

## 2022-09-28 ENCOUNTER — Telehealth: Payer: Self-pay | Admitting: Orthopaedic Surgery

## 2022-09-28 NOTE — Telephone Encounter (Signed)
Pt called, requesting a refill on Hydrocodone to be called to [facility] on [address].  The patient stated that he last had it filled in September, but I'm not seeing that.  Pt's # is [phone]

## 2022-09-29 ENCOUNTER — Other Ambulatory Visit: Payer: Self-pay

## 2022-09-29 MED ORDER — DIPHENHYDRAMINE HCL 25 MG PO TABS: 25.0000 mg | ORAL_TABLET | Freq: Four times a day (QID) | ORAL | 0 refills | Status: DC | PRN

## 2022-09-29 MED ORDER — HYDROCODONE-ACETAMINOPHEN 5-325 MG PO TABS
ORAL_TABLET | ORAL | 0 refills | Status: DC
Start: 2022-09-29 — End: 2022-10-11

## 2022-09-29 NOTE — Addendum Note (Signed)
Addended by: Willette Pa on: 09/29/2022 08:07 AM   Modules accepted: Orders

## 2022-10-04 ENCOUNTER — Ambulatory Visit: Payer: Medicare Other | Admitting: Internal Medicine

## 2022-10-11 ENCOUNTER — Ambulatory Visit (INDEPENDENT_AMBULATORY_CARE_PROVIDER_SITE_OTHER): Payer: Medicare Other | Admitting: Orthopaedic Surgery

## 2022-10-11 ENCOUNTER — Encounter: Payer: Self-pay | Admitting: Orthopaedic Surgery

## 2022-10-11 VITALS — BP 146/70 | HR 100

## 2022-10-11 DIAGNOSIS — M545 Low back pain, unspecified: Secondary | ICD-10-CM | POA: Diagnosis not present

## 2022-10-11 DIAGNOSIS — F17210 Nicotine dependence, cigarettes, uncomplicated: Secondary | ICD-10-CM

## 2022-10-11 MED ORDER — HYDROCODONE-ACETAMINOPHEN 5-325 MG PO TABS: ORAL_TABLET | ORAL | 0 refills | Status: DC

## 2022-10-11 NOTE — Progress Notes (Signed)
He has chronic lower back pain. He is working at [employer].  He has no new trauma, no weakness.  He is taking his medicine.  Spine/Pelvis examination:  Inspection:  Overall, sacoiliac joint benign and hips nontender; without crepitus or defects.   Thoracic spine inspection: Alignment normal without kyphosis present   Lumbar spine inspection:  Alignment  with normal lumbar lordosis, without scoliosis apparent.   Thoracic spine palpation:  without tenderness of spinal processes   Lumbar spine palpation: without tenderness of lumbar area; without tightness of lumbar muscles    Range of Motion:   Lumbar flexion, forward flexion is normal without pain or tenderness    Lumbar extension is full without pain or tenderness   Left lateral bend is normal without pain or tenderness   Right lateral bend is normal without pain or tenderness   Straight leg raising is normal  Strength & tone: normal   Stability overall normal stability  Encounter Diagnoses  Name Primary?   Lumbar pain Yes   Cigarette nicotine dependence without complication    Return in three months.  I have reviewed the [place] web site prior to prescribing narcotic medicine for this patient.  Call if any problem.  Precautions discussed.  Electronically Signed Sanjuana Kava, MD 12/12/20233:17 PM

## 2022-11-03 ENCOUNTER — Encounter: Payer: Self-pay | Admitting: Internal Medicine

## 2022-11-03 ENCOUNTER — Ambulatory Visit (INDEPENDENT_AMBULATORY_CARE_PROVIDER_SITE_OTHER): Payer: Medicare Other | Admitting: Internal Medicine

## 2022-11-03 VITALS — BP 105/69 | HR 66 | Ht 66.0 in | Wt 131.4 lb

## 2022-11-03 DIAGNOSIS — Z114 Encounter for screening for human immunodeficiency virus [HIV]: Secondary | ICD-10-CM

## 2022-11-03 DIAGNOSIS — Z720 Tobacco use: Secondary | ICD-10-CM

## 2022-11-03 DIAGNOSIS — G8929 Other chronic pain: Secondary | ICD-10-CM

## 2022-11-03 DIAGNOSIS — Z2821 Immunization not carried out because of patient refusal: Secondary | ICD-10-CM | POA: Diagnosis not present

## 2022-11-03 DIAGNOSIS — Z1159 Encounter for screening for other viral diseases: Secondary | ICD-10-CM | POA: Diagnosis not present

## 2022-11-03 DIAGNOSIS — Z131 Encounter for screening for diabetes mellitus: Secondary | ICD-10-CM

## 2022-11-03 DIAGNOSIS — Z13220 Encounter for screening for lipoid disorders: Secondary | ICD-10-CM

## 2022-11-03 DIAGNOSIS — Z0001 Encounter for general adult medical examination with abnormal findings: Secondary | ICD-10-CM | POA: Insufficient documentation

## 2022-11-03 DIAGNOSIS — Z1329 Encounter for screening for other suspected endocrine disorder: Secondary | ICD-10-CM

## 2022-11-03 DIAGNOSIS — M545 Low back pain, unspecified: Secondary | ICD-10-CM | POA: Insufficient documentation

## 2022-11-03 DIAGNOSIS — Z1321 Encounter for screening for nutritional disorder: Secondary | ICD-10-CM

## 2022-11-03 MED ORDER — NICOTINE POLACRILEX 4 MG MT GUM: 4.0000 mg | CHEWING_GUM | OROMUCOSAL | 2 refills | Status: AC | PRN

## 2022-11-03 NOTE — Assessment & Plan Note (Signed)
Presenting today to establish care.  Previous records and labs been reviewed. -Baseline labs ordered today, including one-time HIV/HCV screening -Influenza and Tdap vaccines were declined

## 2022-11-03 NOTE — Progress Notes (Signed)
New Patient Office Visit  Subjective    Patient ID: Aaron Spence, male    DOB: 1976-09-04  Age: 47 y.o. MRN: [id number]  CC:  Chief Complaint  Patient presents with   Establish Care   HPI Aaron Spence presents to establish care.  He is a 47 year old male medical history significant for chronic musculoskeletal pain and current tobacco use.  He has most recently been followed by Dr. Legrand Rams.  Aaron Spence is also followed by Dr. Luna Glasgow (orthopedic surgery).  He is currently employed at [employer].  Aaron Spence endorses a family medical history significant for heart disease.  He reports seldom EtOH consumption and occasional marijuana use.  He is asymptomatic currently.  His acute concern today is wanting to stop smoking.  He is interested in options for cessation.  Outpatient Encounter Medications as of 11/03/2022  Medication Sig   diphenhydrAMINE (BENADRYL) 25 MG tablet Take 1 tablet (25 mg total) by mouth every 6 (six) hours as needed. Medication may cause drowsiness   HYDROcodone-acetaminophen (NORCO/VICODIN) 5-325 MG tablet One tablet every six hours for pain.  Limit 7 days.   nicotine polacrilex (NICORETTE STARTER KIT) 4 MG gum Take 1 each (4 mg total) by mouth as needed for smoking cessation.   No facility-administered encounter medications on file as of 11/03/2022.    Past Medical History:  Diagnosis Date   Bronchitis    Cancer ([place])    Tobacco use     History reviewed. No pertinent surgical history.  Family History  Problem Relation Age of Onset   Bronchiolitis Other     Social History   Socioeconomic History   Marital status: Single    Spouse name: Not on file   Number of children: Not on file   Years of education: Not on file   Highest education level: Not on file  Occupational History   Not on file  Tobacco Use   Smoking status: Every Day    Packs/day: 1.50    Years: 20.00    Total pack years: 30.00    Types: Cigarettes   Smokeless tobacco: Never  Vaping  Use   Vaping Use: Never used  Substance and Sexual Activity   Alcohol use: Yes    Comment: occassional   Drug use: Yes    Types: Marijuana    Comment: occas   Sexual activity: Not on file  Other Topics Concern   Not on file  Social History Narrative   ** Merged History Encounter **       Social Determinants of Health   Financial Resource Strain: Not on file  Food Insecurity: Not on file  Transportation Needs: Not on file  Physical Activity: Not on file  Stress: Not on file  Social Connections: Not on file  Intimate Partner Violence: Not on file    Review of Systems  Constitutional:  Negative for chills and fever.  HENT:  Negative for sore throat.   Respiratory:  Negative for cough and shortness of breath.   Cardiovascular:  Negative for chest pain, palpitations and leg swelling.  Gastrointestinal:  Negative for abdominal pain, blood in stool, constipation, diarrhea, nausea and vomiting.  Genitourinary:  Negative for dysuria and hematuria.  Musculoskeletal:  Positive for back pain (Chronic lumbar back pain). Negative for myalgias.  Skin:  Negative for itching and rash.  Neurological:  Negative for dizziness and headaches.  Psychiatric/Behavioral:  Negative for depression and suicidal ideas.     Objective    BP 105/69  Pulse 66   Ht _0  (1.676 m)   Wt 131 lb 6.4 oz (59.6 kg)   SpO2 98%   BMI 21.21 kg/m   Physical Exam Vitals reviewed.  Constitutional:      General: He is not in acute distress.    Appearance: Normal appearance. He is not ill-appearing.  HENT:     Head: Normocephalic and atraumatic.     Right Ear: External ear normal.     Left Ear: External ear normal.     Nose: Nose normal. No congestion or rhinorrhea.     Mouth/Throat:     Mouth: Mucous membranes are moist.     Pharynx: Oropharynx is clear.  Eyes:     General: No scleral icterus.    Extraocular Movements: Extraocular movements intact.     Conjunctiva/sclera: Conjunctivae normal.      Pupils: Pupils are equal, round, and reactive to light.  Cardiovascular:     Rate and Rhythm: Normal rate and regular rhythm.     Pulses: Normal pulses.     Heart sounds: Normal heart sounds. No murmur heard. Pulmonary:     Effort: Pulmonary effort is normal.     Breath sounds: Normal breath sounds. No wheezing, rhonchi or rales.  Abdominal:     General: Abdomen is flat. Bowel sounds are normal. There is no distension.     Palpations: Abdomen is soft.     Tenderness: There is no abdominal tenderness.  Musculoskeletal:        General: No swelling or deformity. Normal range of motion.     Cervical back: Normal range of motion.  Skin:    General: Skin is warm and dry.     Capillary Refill: Capillary refill takes less than 2 seconds.  Neurological:     General: No focal deficit present.     Mental Status: He is alert and oriented to person, place, and time.     Motor: No weakness.  Psychiatric:        Mood and Affect: Mood normal.        Behavior: Behavior normal.        Thought Content: Thought content normal.    Assessment & Plan:   Problem List Items Addressed This Visit       Encounter for general adult medical examination with abnormal findings    Presenting today to establish care.  Previous records and labs been reviewed. -Baseline labs ordered today, including one-time HIV/HCV screening -Influenza and Tdap vaccines were declined      Tobacco use    He currently smokes 1 pack/day of cigarettes and has been smoking since he was a teenager he expresses an interest in smoking cessation. -I congratulated him on taking the initial steps toward cessation -We reviewed treatment options for cessation assistance.  He is interested in Nicorette gum.  I reviewed with him that intent of Nicorette gum is to replace the nicotine that he would receive from cigarettes.  He should stop smoking when he starts using the gum, otherwise it would just be supplementing the nicotine he is  already receiving from cigarettes.  He expressed understanding. -Follow-up in 6 weeks to assess progress      Chronic low back pain    Followed by orthopedic surgery (Dr. Luna Glasgow).  Norco 5-325 mg every 6 hours as needed for pain relief.  PDMP reviewed.  No changes today.      Return in about 6 weeks (around 12/15/2022) for smoking cessation, labs.  Johnette Abraham, MD

## 2022-11-03 NOTE — Assessment & Plan Note (Signed)
Followed by orthopedic surgery (Dr. Luna Glasgow).  Norco 5-325 mg every 6 hours as needed for pain relief.  PDMP reviewed.  No changes today.

## 2022-11-03 NOTE — Assessment & Plan Note (Signed)
He currently smokes 1 pack/day of cigarettes and has been smoking since he was a teenager he expresses an interest in smoking cessation. -I congratulated him on taking the initial steps toward cessation -We reviewed treatment options for cessation assistance.  He is interested in Nicorette gum.  I reviewed with him that intent of Nicorette gum is to replace the nicotine that he would receive from cigarettes.  He should stop smoking when he starts using the gum, otherwise it would just be supplementing the nicotine he is already receiving from cigarettes.  He expressed understanding. -Follow-up in 6 weeks to assess progress

## 2022-11-03 NOTE — Patient Instructions (Signed)
It was a pleasure to see you today.  Thank you for giving [place] the opportunity to be involved in your care.  Below is a brief recap of your visit and next steps.  We will plan to see you again in 6 weeks.  Summary You have established care today We will check labs I have prescribed nicorette gum for smoking cessation. This is intended to replace the nicotine you would get from cigarettes and you should not continue to smoke while using gum Follow up in 6 weeks to assess your progress

## 2022-11-04 ENCOUNTER — Other Ambulatory Visit: Payer: Self-pay | Admitting: Internal Medicine

## 2022-11-04 ENCOUNTER — Ambulatory Visit (INDEPENDENT_AMBULATORY_CARE_PROVIDER_SITE_OTHER): Payer: 59

## 2022-11-04 ENCOUNTER — Encounter: Payer: Self-pay | Admitting: Internal Medicine

## 2022-11-04 VITALS — BP 110/70 | HR 70 | Ht 66.0 in | Wt 131.6 lb

## 2022-11-04 DIAGNOSIS — Z0000 Encounter for general adult medical examination without abnormal findings: Secondary | ICD-10-CM | POA: Diagnosis not present

## 2022-11-04 DIAGNOSIS — E559 Vitamin D deficiency, unspecified: Secondary | ICD-10-CM

## 2022-11-04 LAB — LIPID PANEL

## 2022-11-04 MED ORDER — VITAMIN D (ERGOCALCIFEROL) 1.25 MG (50000 UNIT) PO CAPS: 50000.0000 [IU] | ORAL_CAPSULE | ORAL | 0 refills | Status: AC

## 2022-11-04 NOTE — Patient Instructions (Signed)
Health Maintenance, Male Adopting a healthy lifestyle and getting preventive care are important in promoting health and wellness. Ask your health care provider about: The right schedule for you to have regular tests and exams. Things you can do on your own to prevent diseases and keep yourself healthy. What should I know about diet, weight, and exercise? Eat a healthy diet  Eat a diet that includes plenty of vegetables, fruits, low-fat dairy products, and lean protein. Do not eat a lot of foods that are high in solid fats, added sugars, or sodium. Maintain a healthy weight Body mass index (BMI) is a measurement that can be used to identify possible weight problems. It estimates body fat based on height and weight. Your health care provider can help determine your BMI and help you achieve or maintain a healthy weight. Get regular exercise Get regular exercise. This is one of the most important things you can do for your health. Most adults should: Exercise for at least 150 minutes each week. The exercise should increase your heart rate and make you sweat (moderate-intensity exercise). Do strengthening exercises at least twice a week. This is in addition to the moderate-intensity exercise. Spend less time sitting. Even light physical activity can be beneficial. Watch cholesterol and blood lipids Have your blood tested for lipids and cholesterol at 45 years of age, then have this test every 5 years. You may need to have your cholesterol levels checked more often if: Your lipid or cholesterol levels are high. You are older than 45 years of age. You are at high risk for heart disease. What should I know about cancer screening? Many types of cancers can be detected early and may often be prevented. Depending on your health history and family history, you may need to have cancer screening at various ages. This may include screening for: Colorectal cancer. Prostate cancer. Skin cancer. Lung  cancer. What should I know about heart disease, diabetes, and high blood pressure? Blood pressure and heart disease High blood pressure causes heart disease and increases the risk of stroke. This is more likely to develop in people who have high blood pressure readings or are overweight. Talk with your health care provider about your target blood pressure readings. Have your blood pressure checked: Every 3-5 years if you are 18-39 years of age. Every year if you are 40 years old or older. If you are between the ages of 65 and 75 and are a current or former smoker, ask your health care provider if you should have a one-time screening for abdominal aortic aneurysm (AAA). Diabetes Have regular diabetes screenings. This checks your fasting blood sugar level. Have the screening done: Once every three years after age 45 if you are at a normal weight and have a low risk for diabetes. More often and at a younger age if you are overweight or have a high risk for diabetes. What should I know about preventing infection? Hepatitis B If you have a higher risk for hepatitis B, you should be screened for this virus. Talk with your health care provider to find out if you are at risk for hepatitis B infection. Hepatitis C Blood testing is recommended for: Everyone born from 1945 through 1965. Anyone with known risk factors for hepatitis C. Sexually transmitted infections (STIs) You should be screened each year for STIs, including gonorrhea and chlamydia, if: You are sexually active and are younger than 45 years of age. You are older than 45 years of age and your   health care provider tells you that you are at risk for this type of infection. Your sexual activity has changed since you were last screened, and you are at increased risk for chlamydia or gonorrhea. Ask your health care provider if you are at risk. Ask your health care provider about whether you are at high risk for HIV. Your health care provider  may recommend a prescription medicine to help prevent HIV infection. If you choose to take medicine to prevent HIV, you should first get tested for HIV. You should then be tested every 3 months for as long as you are taking the medicine. Follow these instructions at home: Alcohol use Do not drink alcohol if your health care provider tells you not to drink. If you drink alcohol: Limit how much you have to 0-2 drinks a day. Know how much alcohol is in your drink. In the [place] one drink equals one 12 oz bottle of beer (355 mL), one 5 oz glass of wine (148 mL), or one 1 oz glass of hard liquor (44 mL). Lifestyle Do not use any products that contain nicotine or tobacco. These products include cigarettes, chewing tobacco, and vaping devices, such as e-cigarettes. If you need help quitting, ask your health care provider. Do not use street drugs. Do not share needles. Ask your health care provider for help if you need support or information about quitting drugs. General instructions Schedule regular health, dental, and eye exams. Stay current with your vaccines. Tell your health care provider if: You often feel depressed. You have ever been abused or do not feel safe at home. Summary Adopting a healthy lifestyle and getting preventive care are important in promoting health and wellness. Follow your health care provider's instructions about healthy diet, exercising, and getting tested or screened for diseases. Follow your health care provider's instructions on monitoring your cholesterol and blood pressure. This information is not intended to replace advice given to you by your health care provider. Make sure you discuss any questions you have with your health care provider. Document Revised: 03/08/2021 Document Reviewed: 03/08/2021 Elsevier Patient Education  2023 Elsevier Inc.  

## 2022-11-04 NOTE — Progress Notes (Signed)
Subjective:   Aaron Spence is a 47 y.o. male who presents for Medicare Annual/Subsequent preventive examination.  Review of Systems     Aaron Spence , Thank you for taking time to come for your Medicare Wellness Visit. I appreciate your ongoing commitment to your health goals. Please review the following plan we discussed and let me know if I can assist you in the future.   These are the goals we discussed:  Goals   None     This is a list of the screening recommended for you and due dates:  Health Maintenance  Topic Date Due   COVID-19 Vaccine (1) Never done   DTaP/Tdap/Td vaccine (1 - Tdap) Never done   Flu Shot  01/29/2023*   Medicare Annual Wellness Visit  11/05/2023   Hepatitis C Screening: USPSTF Recommendation to screen - Ages 18-79 yo.  Completed   HIV Screening  Completed   HPV Vaccine  Aged Out  *Topic was postponed. The date shown is not the original due date.          Objective:    There were no vitals filed for this visit. There is no height or weight on file to calculate BMI.     08/23/2022    7:00 PM 08/20/2022    7:15 PM 07/17/2022   12:05 AM 01/30/2022   10:45 PM 12/26/2021    7:53 PM 02/18/2021    5:45 PM 03/25/2020    5:19 PM  Advanced Directives  Does Patient Have a Medical Advance Directive? No No No No No No No  Would patient like information on creating a medical advance directive?  No - Patient declined No - Patient declined No - Patient declined  No - Patient declined     Current Medications (verified) Outpatient Encounter Medications as of 11/04/2022  Medication Sig   Vitamin D, Ergocalciferol, (DRISDOL) 1.25 MG (50000 UNIT) CAPS capsule Take 1 capsule (50,000 Units total) by mouth every 7 (seven) days for 12 doses.   diphenhydrAMINE (BENADRYL) 25 MG tablet Take 1 tablet (25 mg total) by mouth every 6 (six) hours as needed. Medication may cause drowsiness   HYDROcodone-acetaminophen (NORCO/VICODIN) 5-325 MG tablet One tablet every six hours  for pain.  Limit 7 days.   nicotine polacrilex (NICORETTE STARTER KIT) 4 MG gum Take 1 each (4 mg total) by mouth as needed for smoking cessation.   No facility-administered encounter medications on file as of 11/04/2022.    Allergies (verified) Patient has no known allergies.   History: Past Medical History:  Diagnosis Date   Bronchitis    Cancer ([place])    Tobacco use    No past surgical history on file. Family History  Problem Relation Age of Onset   Bronchiolitis Other    Social History   Socioeconomic History   Marital status: Single    Spouse name: Not on file   Number of children: Not on file   Years of education: Not on file   Highest education level: Not on file  Occupational History   Not on file  Tobacco Use   Smoking status: Every Day    Packs/day: 1.50    Years: 20.00    Total pack years: 30.00    Types: Cigarettes   Smokeless tobacco: Never  Vaping Use   Vaping Use: Never used  Substance and Sexual Activity   Alcohol use: Yes    Comment: occassional   Drug use: Yes    Types: Marijuana  Comment: occas   Sexual activity: Not on file  Other Topics Concern   Not on file  Social History Narrative   ** Merged History Encounter **       Social Determinants of Health   Financial Resource Strain: Not on file  Food Insecurity: Not on file  Transportation Needs: Not on file  Physical Activity: Not on file  Stress: Not on file  Social Connections: Not on file    Tobacco Counseling Ready to quit: Not Answered Counseling given: Not Answered   Clinical Intake:                 Diabetic?No         Activities of Daily Living     No data to display           Patient Care Team: Johnette Abraham, MD as PCP - General (Internal Medicine) Carrolyn Meiers, MD (Internal Medicine)  Indicate any recent Medical Services you may have received from other than [facility] providers in the past year (date may be approximate).      Assessment:   This is a routine wellness examination for Aaron Spence.  Hearing/Vision screen No results found.  Dietary issues and exercise activities discussed:     Goals Addressed   None   Depression Screen    11/03/2022    8:34 AM  PHQ 2/9 Scores  PHQ - 2 Score 0    Fall Risk    11/03/2022    8:34 AM  [place] in the past year? 0  Number falls in past yr: 0  Injury with Fall? 0  Risk for fall due to : No Fall Risks  Follow up Falls evaluation completed    P[place]iar:  Any stairs in or around the home? No  If so, are there any without handrails? No  Home free of loose throw rugs in walkways, pet beds, electrical cords, etc? Yes  Adequate lighting in your home to reduce risk of falls? Yes   ASSISTIVE DEVICES UTILIZED TO PREVENT FALLS:  Life alert? No  Use of a cane, walker or w/c? No  Grab bars in the bathroom? No  Shower chair or bench in shower? No  Elevated toilet seat or a handicapped toilet? No   TIMED UP AND GO:  Was the test performed? Yes .  Length of time to ambulate 10 feet: 20 sec.   Gait steady and fast without use of assistive device  Cognitive Function:        Immunizations  There is no immunization history on file for this patient.  TDAP status: Due, Education has been provided regarding the importance of this vaccine. Advised may receive this vaccine at local pharmacy or Health Dept. Aware to provide a copy of the vaccination record if obtained from local pharmacy or Health Dept. Verbalized acceptance and understanding.  Flu Vaccine status: Declined, Education has been provided regarding the importance of this vaccine but patient still declined. Advised may receive this vaccine at local pharmacy or Health Dept. Aware to provide a copy of the vaccination record if obtained from local pharmacy or Health Dept. Verbalized acceptance and understanding.  Pneumococcal vaccine status: Declined,  Education has  been provided regarding the importance of this vaccine but patient still declined. Advised may receive this vaccine at local pharmacy or Health Dept. Aware to provide a copy of the vaccination record if obtained from local pharmacy or Health Dept. Verbalized  acceptance and understanding.   Covid-19 vaccine status: Declined, Education has been provided regarding the importance of this vaccine but patient still declined. Advised may receive this vaccine at local pharmacy or Health Dept.or vaccine clinic. Aware to provide a copy of the vaccination record if obtained from local pharmacy or Health Dept. Verbalized acceptance and understanding.  Qualifies for Shingles Vaccine? No   Zostavax completed No   Shingrix Completed?: No.    Education has been provided regarding the importance of this vaccine. Patient has been advised to call insurance company to determine out of pocket expense if they have not yet received this vaccine. Advised may also receive vaccine at local pharmacy or Health Dept. Verbalized acceptance and understanding.  Screening Tests Health Maintenance  Topic Date Due   COVID-19 Vaccine (1) Never done   DTaP/Tdap/Td (1 - Tdap) Never done   INFLUENZA VACCINE  01/29/2023 (Originally 05/31/2022)   Medicare Annual Wellness (AWV)  11/05/2023   Hepatitis C Screening  Completed   HIV Screening  Completed   HPV VACCINES  Aged Out    Health Maintenance  Health Maintenance Due  Topic Date Due   COVID-19 Vaccine (1) Never done   DTaP/Tdap/Td (1 - Tdap) Never done    Colorectal cancer screening: Referral to GI placed 11/04/2022. Pt aware the office will call re: appt.  Lung Cancer Screening: (Low Dose CT Chest recommended if Age 102-80 years, 30 pack-year currently smoking OR have quit w/in 15years.) does qualify.   Lung Cancer Screening Referral:   Additional Screening:  Hepatitis C Screening: does qualify; Completed 11/03/2022  Vision Screening: Recommended annual ophthalmology  exams for early detection of glaucoma and other disorders of the eye. Is the patient up to date with their annual eye exam?  No  Who is the provider or what is the name of the office in which the patient attends annual eye exams?  If pt is not established with a provider, would they like to be referred to a provider to establish care? .   Dental Screening: Recommended annual dental exams for proper oral hygiene  Community Resource Referral / Chronic Care Management: CRR required this visit?  No   CCM required this visit?  No      Plan:     I have personally reviewed and noted the following in the patient's chart:   Medical and social history Use of alcohol, tobacco or illicit drugs  Current medications and supplements including opioid prescriptions. Patient is currently taking opioid prescriptions. Information provided to patient regarding non-opioid alternatives. Patient advised to discuss non-opioid treatment plan with their provider. Functional ability and status Nutritional status Physical activity Advanced directives List of other physicians Hospitalizations, surgeries, and ER visits in previous 12 months Vitals Screenings to include cognitive, depression, and falls Referrals and appointments  In addition, I have reviewed and discussed with patient certain preventive protocols, quality metrics, and best practice recommendations. A written personalized care plan for preventive services as well as general preventive health recommendations were provided to patient.     Aaron Spence, [place]   11/04/2022   Nurse Notes:  Aaron Spence , Thank you for taking time to come for your Medicare Wellness Visit. I appreciate your ongoing commitment to your health goals. Please review the following plan we discussed and let me know if I can assist you in the future.   These are the goals we discussed:  Goals   None     This is a list of  the screening recommended for you and due dates:   Health Maintenance  Topic Date Due   COVID-19 Vaccine (1) Never done   DTaP/Tdap/Td vaccine (1 - Tdap) Never done   Flu Shot  01/29/2023*   Medicare Annual Wellness Visit  11/05/2023   Hepatitis C Screening: USPSTF Recommendation to screen - Ages 18-79 yo.  Completed   HIV Screening  Completed   HPV Vaccine  Aged Out  *Topic was postponed. The date shown is not the original due date.

## 2022-11-05 LAB — CBC WITH DIFFERENTIAL/PLATELET
Basophils Absolute: 0.1 10*3/uL (ref 0.0–0.2)
Basos: 1 %
EOS (ABSOLUTE): 0.1 10*3/uL (ref 0.0–0.4)
Eos: 2 %
Hematocrit: 42 % (ref 37.5–51.0)
Hemoglobin: 14.6 g/dL (ref 13.0–17.7)
Immature Grans (Abs): 0 10*3/uL (ref 0.0–0.1)
Immature Granulocytes: 0 %
Lymphocytes Absolute: 2.2 10*3/uL (ref 0.7–3.1)
Lymphs: 39 %
MCH: 29.4 pg (ref 26.6–33.0)
MCHC: 34.8 g/dL (ref 31.5–35.7)
MCV: 85 fL (ref 79–97)
Monocytes Absolute: 0.5 10*3/uL (ref 0.1–0.9)
Monocytes: 8 %
Neutrophils Absolute: 2.8 10*3/uL (ref 1.4–7.0)
Neutrophils: 50 %
Platelets: 316 10*3/uL (ref 150–450)
RBC: 4.97 x10E6/uL (ref 4.14–5.80)
RDW: 13.5 % (ref 11.6–15.4)
WBC: 5.7 10*3/uL (ref 3.4–10.8)

## 2022-11-05 LAB — CMP14+EGFR
ALT: 12 IU/L (ref 0–44)
AST: 13 IU/L (ref 0–40)
Albumin/Globulin Ratio: 2.4 — ABNORMAL HIGH (ref 1.2–2.2)
Albumin: 4.6 g/dL (ref 4.1–5.1)
Alkaline Phosphatase: 74 IU/L (ref 44–121)
BUN/Creatinine Ratio: 13 (ref 9–20)
BUN: 14 mg/dL (ref 6–24)
Bilirubin Total: 0.3 mg/dL (ref 0.0–1.2)
CO2: 24 mmol/L (ref 20–29)
Calcium: 9.4 mg/dL (ref 8.7–10.2)
Chloride: 105 mmol/L (ref 96–106)
Creatinine, Ser: 1.09 mg/dL (ref 0.76–1.27)
Globulin, Total: 1.9 g/dL (ref 1.5–4.5)
Glucose: 88 mg/dL (ref 70–99)
Potassium: 4.1 mmol/L (ref 3.5–5.2)
Sodium: 144 mmol/L (ref 134–144)
Total Protein: 6.5 g/dL (ref 6.0–8.5)
eGFR: 86 mL/min/{1.73_m2} (ref >59)

## 2022-11-05 LAB — LIPID PANEL
Chol/HDL Ratio: 2.9 ratio (ref 0.0–5.0)
Cholesterol, Total: 150 mg/dL (ref 100–199)
HDL: 51 mg/dL (ref >39)
LDL Chol Calc (NIH): 87 mg/dL (ref 0–99)
Triglycerides: 55 mg/dL (ref 0–149)
VLDL Cholesterol Cal: 12 mg/dL (ref 5–40)

## 2022-11-05 LAB — TSH+FREE T4
Free T4: 1.36 ng/dL (ref 0.82–1.77)
TSH: 3.26 u[IU]/mL (ref 0.450–4.500)

## 2022-11-05 LAB — HCV INTERPRETATION

## 2022-11-05 LAB — HEMOGLOBIN A1C
Est. average glucose Bld gHb Est-mCnc: 114 mg/dL
Hgb A1c MFr Bld: 5.6 % (ref 4.8–5.6)

## 2022-11-05 LAB — HCV AB W REFLEX TO QUANT PCR: HCV Ab: NONREACTIVE

## 2022-11-05 LAB — B12 AND FOLATE PANEL
Folate: 7.7 ng/mL (ref >3.0)
Vitamin B-12: 350 pg/mL (ref 232–1245)

## 2022-11-05 LAB — VITAMIN D 25 HYDROXY (VIT D DEFICIENCY, FRACTURES): Vit D, 25-Hydroxy: <4 ng/mL — ABNORMAL LOW (ref 30.0–100.0)

## 2022-11-05 LAB — HIV ANTIBODY (ROUTINE TESTING W REFLEX): HIV Screen 4th Generation wRfx: NONREACTIVE

## 2022-11-07 ENCOUNTER — Encounter: Payer: Medicare Other | Admitting: Internal Medicine

## 2022-11-24 ENCOUNTER — Telehealth: Payer: Self-pay | Admitting: Orthopaedic Surgery

## 2022-11-24 NOTE — Telephone Encounter (Signed)
Patient lvm requesting a refill on Hydrocodone 5-325 to be sent to [facility] on [address].  Pt's # [phone]

## 2022-11-25 NOTE — Telephone Encounter (Signed)
Sent to provider 

## 2022-11-26 MED ORDER — HYDROCODONE-ACETAMINOPHEN 5-325 MG PO TABS: ORAL_TABLET | ORAL | 0 refills | Status: DC

## 2022-12-12 ENCOUNTER — Emergency Department (HOSPITAL_COMMUNITY)
Admission: EM | Admit: 2022-12-12 | Discharge: 2022-12-12 | Discharge status: Against Medical Advice | Payer: 59 | Source: Home / Self Care

## 2022-12-13 DIAGNOSIS — Z681 Body mass index (BMI) 19 or less, adult: Secondary | ICD-10-CM | POA: Diagnosis not present

## 2022-12-13 DIAGNOSIS — Z20822 Contact with and (suspected) exposure to covid-19: Secondary | ICD-10-CM | POA: Diagnosis not present

## 2022-12-15 ENCOUNTER — Ambulatory Visit: Payer: Medicare Other | Admitting: Internal Medicine

## 2022-12-19 DIAGNOSIS — Z6821 Body mass index (BMI) 21.0-21.9, adult: Secondary | ICD-10-CM | POA: Diagnosis not present

## 2022-12-19 DIAGNOSIS — Z20822 Contact with and (suspected) exposure to covid-19: Secondary | ICD-10-CM | POA: Diagnosis not present

## 2022-12-29 ENCOUNTER — Other Ambulatory Visit: Payer: Self-pay

## 2022-12-29 ENCOUNTER — Encounter (HOSPITAL_COMMUNITY): Payer: Self-pay

## 2022-12-29 ENCOUNTER — Emergency Department (HOSPITAL_COMMUNITY)
Admission: EM | Admit: 2022-12-29 | Discharge: 2022-12-29 | Disposition: A | Discharge status: Home / Self Care | Payer: 59 | Attending: Emergency Medicine | Admitting: Emergency Medicine

## 2022-12-29 DIAGNOSIS — S39012A Strain of muscle, fascia and tendon of lower back, initial encounter: Secondary | ICD-10-CM

## 2022-12-29 DIAGNOSIS — X500XXA Overexertion from strenuous movement or load, initial encounter: Secondary | ICD-10-CM | POA: Insufficient documentation

## 2022-12-29 DIAGNOSIS — S3992XA Unspecified injury of lower back, initial encounter: Secondary | ICD-10-CM | POA: Diagnosis present

## 2022-12-29 MED ORDER — IBUPROFEN 800 MG PO TABS
800.0000 mg | ORAL_TABLET | Freq: Once | ORAL | Status: AC
  Administered 2022-12-29: 800 mg via ORAL
  Filled 2022-12-29: qty 1

## 2022-12-29 MED ORDER — METHOCARBAMOL 500 MG PO TABS
500.0000 mg | ORAL_TABLET | Freq: Once | ORAL | Status: AC
  Administered 2022-12-29: 500 mg via ORAL
  Filled 2022-12-29: qty 1

## 2022-12-29 MED ORDER — METHOCARBAMOL 500 MG PO TABS: 500.0000 mg | ORAL_TABLET | Freq: Three times a day (TID) | ORAL | 0 refills | Status: AC | PRN

## 2022-12-29 MED ORDER — MELOXICAM 15 MG PO TABS: 15.0000 mg | ORAL_TABLET | Freq: Every day | ORAL | 0 refills | Status: DC

## 2022-12-29 NOTE — ED Provider Notes (Signed)
Aaron Spence   CSN: [id number] Arrival date & time: 12/29/22  2237     History  Chief Complaint  Patient presents with   Back Pain    Aaron Spence is a 47 y.o. male.  Patient presents to the emergency department for evaluation of low back pain.  Patient has history of chronic low back pain but pain worsened after helping somebody move.  He reports that he had been lifting some heavy objects.  No known injury.  Pain does not radiate to the legs.  No change in bowel or bladder function.       Home Medications Prior to Admission medications   Medication Sig Start Date End Date Taking? Authorizing Provider  meloxicam (MOBIC) 15 MG tablet Take 1 tablet (15 mg total) by mouth daily. 12/29/22  Yes Shelie Lansing, Gwenyth Allegra, MD  methocarbamol (ROBAXIN) 500 MG tablet Take 1 tablet (500 mg total) by mouth every 8 (eight) hours as needed for muscle spasms. 12/29/22  Yes Dirck Butch, Gwenyth Allegra, MD  albuterol (VENTOLIN HFA) 108 (90 Base) MCG/ACT inhaler SMARTSIG:1 Puff(s) Via Inhaler 4 Times Daily PRN 09/27/22   [provider]  diphenhydrAMINE (BENADRYL) 25 MG tablet Take 1 tablet (25 mg total) by mouth every 6 (six) hours as needed. Medication may cause drowsiness 09/29/22   Sanjuana Kava, MD  HYDROcodone-acetaminophen (NORCO/VICODIN) 5-325 MG tablet One tablet every six hours for pain.  Limit 7 days. 11/26/22   Sanjuana Kava, MD  nicotine polacrilex (NICORETTE STARTER KIT) 4 MG gum Take 1 each (4 mg total) by mouth as needed for smoking cessation. 11/03/22   Johnette Abraham, MD  Vitamin D, Ergocalciferol, (DRISDOL) 1.25 MG (50000 UNIT) CAPS capsule Take 1 capsule (50,000 Units total) by mouth every 7 (seven) days for 12 doses. 11/04/22 01/21/23  Johnette Abraham, MD      Allergies    Patient has no known allergies.    Review of Systems   Review of Systems  Physical Exam Updated Vital Signs BP 120/72 (BP Location: Right  Arm)   Pulse 90   Temp (!) 97.4 F (36.3 C) (Oral)   Resp 18   Ht '5\' 6"'$  (1.676 m)   Wt 61.2 kg   SpO2 96%   BMI 21.79 kg/m  Physical Exam Vitals and nursing Spence reviewed.  Constitutional:      General: He is not in acute distress.    Appearance: He is well-developed.  HENT:     Head: Normocephalic and atraumatic.     Mouth/Throat:     Mouth: Mucous membranes are moist.  Eyes:     General: Vision grossly intact. Gaze aligned appropriately.     Extraocular Movements: Extraocular movements intact.     Conjunctiva/sclera: Conjunctivae normal.  Cardiovascular:     Rate and Rhythm: Normal rate and regular rhythm.     Pulses: Normal pulses.     Heart sounds: Normal heart sounds, S1 normal and S2 normal. No murmur heard.    No friction rub. No gallop.  Pulmonary:     Effort: Pulmonary effort is normal. No respiratory distress.     Breath sounds: Normal breath sounds.  Abdominal:     Palpations: Abdomen is soft.     Tenderness: There is no abdominal tenderness. There is no guarding or rebound.     Hernia: No hernia is present.  Musculoskeletal:        General: No swelling.     Cervical  back: Full passive range of motion without pain, normal range of motion and neck supple. No pain with movement, spinous process tenderness or muscular tenderness. Normal range of motion.     Lumbar back: Tenderness present. Negative right straight leg raise test and negative left straight leg raise test.     Right lower leg: No edema.     Left lower leg: No edema.  Skin:    General: Skin is warm and dry.     Capillary Refill: Capillary refill takes less than 2 seconds.     Findings: No ecchymosis, erythema, lesion or wound.  Neurological:     Mental Status: He is alert and oriented to person, place, and time.     GCS: GCS eye subscore is 4. GCS verbal subscore is 5. GCS motor subscore is 6.     Cranial Nerves: Cranial nerves 2-12 are intact.     Sensory: Sensation is intact.     Motor: Motor  function is intact. No weakness or abnormal muscle tone.     Coordination: Coordination is intact.     Deep Tendon Reflexes:     Reflex Scores:      Patellar reflexes are 2+ on the right side and 2+ on the left side. Psychiatric:        Mood and Affect: Mood normal.        Speech: Speech normal.        Behavior: Behavior normal.     ED Results / Procedures / Treatments   Labs (all labs ordered are listed, but only abnormal results are displayed) Labs Reviewed - No data to display  EKG None  Radiology No results found.  Procedures Procedures    Medications Ordered in ED Medications - No data to display  ED Course/ Medical Decision Making/ A&P                             Medical Decision Making  Differential diagnosis considered includes, not limited to: Muscle strain; acute disc herniation; lumbar fracture; cauda equina syndrome; infection; malignancy  Patient presents to the ER with musculoskeletal back pain. Examination reveals back tenderness without any associated neurologic findings. Patient's strength, sensation and reflexes were normal. There is no evidence of saddle anesthesia. Patient does not have a foot drop. Patient has not experienced any change in bowel or bladder function. As such, patient did not require any imaging or further studies.   Review of his records reveals that he has been seeing Dr. Luna Glasgow for lumbar back pain recently.  He has been getting Vicodin from Dr. Luna Glasgow.    Presentation is exacerbation of his chronic back pain secondary to lifting heavy objects without any signs of neuro compromise.  Add NSAID, muscle relaxer.        Final Clinical Impression(s) / ED Diagnoses Final diagnoses:  Strain of lumbar region, initial encounter    Rx / DC Orders ED Discharge Orders          Ordered    meloxicam (MOBIC) 15 MG tablet  Daily        12/29/22 2313    methocarbamol (ROBAXIN) 500 MG tablet  Every 8 hours PRN        12/29/22 2313               Orpah Greek, MD 12/29/22 2313

## 2022-12-29 NOTE — ED Triage Notes (Signed)
Lower back pain since yesterday.  States he has been lifting heavy stuff

## 2022-12-30 ENCOUNTER — Encounter: Payer: Self-pay | Admitting: Internal Medicine

## 2022-12-30 ENCOUNTER — Ambulatory Visit (INDEPENDENT_AMBULATORY_CARE_PROVIDER_SITE_OTHER): Payer: 59 | Admitting: Internal Medicine

## 2022-12-30 VITALS — BP 111/68 | HR 95 | Ht 66.0 in | Wt 132.4 lb

## 2022-12-30 DIAGNOSIS — M545 Low back pain, unspecified: Secondary | ICD-10-CM | POA: Diagnosis not present

## 2022-12-30 DIAGNOSIS — E559 Vitamin D deficiency, unspecified: Secondary | ICD-10-CM

## 2022-12-30 DIAGNOSIS — Z2821 Immunization not carried out because of patient refusal: Secondary | ICD-10-CM | POA: Diagnosis not present

## 2022-12-30 DIAGNOSIS — G8929 Other chronic pain: Secondary | ICD-10-CM

## 2022-12-30 DIAGNOSIS — Z720 Tobacco use: Secondary | ICD-10-CM | POA: Diagnosis not present

## 2022-12-30 NOTE — Assessment & Plan Note (Signed)
Vitamin D level undetectably low on labs from January.  High-dose, weekly vitamin D supplementation x 12 weeks was prescribed.  He is currently taking weekly vitamin D supplement as prescribed. -Repeat vitamin D level upon completion of weekly vitamin D supplementation.

## 2022-12-30 NOTE — Assessment & Plan Note (Signed)
He continues to smoke 1 pack of cigarettes per day.  He has previously expressed an interest in smoking cessation and wanted to try Nicorette gum.  This was prescribed at his last appointment, however he did not fill the prescription due to cost.  Today he remains interested in smoking cessation and would still like to try Nicorette gum if he is able to find it at a more affordable cost. -Today he was provided with a coupon for Nicorette gum that he can take to his pharmacy.  He will notify our office if this does not work and we will explore additional options.

## 2022-12-30 NOTE — Progress Notes (Signed)
Established Patient Office Visit  Subjective   Patient ID: Aaron Spence, male    DOB: 02-25-1976  Age: 47 y.o. MRN: [id number]  Chief Complaint  Patient presents with   Follow-up    Follow up reports being seen at ER yesterday for back pain denies pain today    Aaron Spence returns to care today for 6-week follow-up.  He was last evaluated by me on 1/4 as a new patient presenting to establish care.  At that time he expressed a desire to quit smoking and wanted to try Nicorette gum.  This was prescribed and baseline labs were ordered.  6-week follow-up was arranged to assess his progress.  In the interim, Aaron Spence presented to the emergency department yesterday (2/29) for chronic low back pain.  No red flag symptoms were identified.  He was discharged with prescriptions for meloxicam and Robaxin. Today Aaron Spence reports that his back pain has significantly improved.  He is otherwise asymptomatic.  He states that he did not fill the previous prescription for Nicorette gum due to cost.  He remains interested in smoking cessation.  He has no additional concerns to discuss.  Past Medical History:  Diagnosis Date   Bronchitis    Cancer (Aaron Spence)    Tobacco use    History reviewed. No pertinent surgical history. Social History   Tobacco Use   Smoking status: Every Day    Packs/day: 1.00    Years: 20.00    Total pack years: 20.00    Types: Cigarettes   Smokeless tobacco: Never  Vaping Use   Vaping Use: Never used  Substance Use Topics   Alcohol use: Yes    Comment: occassional   Drug use: Yes    Types: Marijuana    Comment: occas   Family History  Problem Relation Age of Onset   Bronchiolitis Other    No Known Allergies  Review of Systems  Constitutional:  Negative for chills and fever.  HENT:  Negative for sore throat.   Respiratory:  Negative for cough and shortness of breath.   Cardiovascular:  Negative for chest pain, palpitations and leg swelling.  Gastrointestinal:   Negative for abdominal pain, blood in stool, constipation, diarrhea, nausea and vomiting.  Genitourinary:  Negative for dysuria and hematuria.  Musculoskeletal:  Negative for myalgias.  Skin:  Negative for itching and rash.  Neurological:  Negative for dizziness and headaches.  Psychiatric/Behavioral:  Negative for depression and suicidal ideas.      Objective:     BP 111/68 (BP Location: Right Arm, Patient Position: Sitting, Cuff Size: Normal)   Pulse 95   Ht '5\' 6"'$  (1.676 m)   Wt 132 lb 6.4 oz (60.1 kg)   SpO2 95%   BMI 21.37 kg/m  BP Readings from Last 3 Encounters:  12/30/22 111/68  12/29/22 120/72  11/04/22 110/70   Physical Exam Vitals reviewed.  Constitutional:      General: He is not in acute distress.    Appearance: Normal appearance. He is not ill-appearing.  HENT:     Head: Normocephalic and atraumatic.     Right Ear: External ear normal.     Left Ear: External ear normal.     Nose: Nose normal. No congestion or rhinorrhea.     Mouth/Throat:     Mouth: Mucous membranes are moist.     Pharynx: Oropharynx is clear.  Eyes:     General: No scleral icterus.    Extraocular Movements: Extraocular movements intact.  Conjunctiva/sclera: Conjunctivae normal.     Pupils: Pupils are equal, round, and reactive to light.  Cardiovascular:     Rate and Rhythm: Normal rate and regular rhythm.     Pulses: Normal pulses.     Heart sounds: Normal heart sounds. No murmur heard. Pulmonary:     Effort: Pulmonary effort is normal.     Breath sounds: Normal breath sounds. No wheezing, rhonchi or rales.  Abdominal:     General: Abdomen is flat. Bowel sounds are normal. There is no distension.     Palpations: Abdomen is soft.     Tenderness: There is no abdominal tenderness.  Musculoskeletal:        General: No swelling or deformity. Normal range of motion.     Cervical back: Normal range of motion.  Skin:    General: Skin is warm and dry.     Capillary Refill: Capillary  refill takes less than 2 seconds.  Neurological:     General: No focal deficit present.     Mental Status: He is alert and oriented to person, place, and time.     Motor: No weakness.  Psychiatric:        Mood and Affect: Mood normal.        Behavior: Behavior normal.        Thought Content: Thought content normal.   Last CBC Lab Results  Component Value Date   WBC 5.7 11/03/2022   HGB 14.6 11/03/2022   HCT 42.0 11/03/2022   MCV 85 11/03/2022   MCH 29.4 11/03/2022   RDW 13.5 11/03/2022   PLT 316 0000000   Last metabolic panel Lab Results  Component Value Date   GLUCOSE 88 11/03/2022   NA 144 11/03/2022   K 4.1 11/03/2022   CL 105 11/03/2022   CO2 24 11/03/2022   BUN 14 11/03/2022   CREATININE 1.09 11/03/2022   EGFR 86 11/03/2022   CALCIUM 9.4 11/03/2022   PROT 6.5 11/03/2022   ALBUMIN 4.6 11/03/2022   LABGLOB 1.9 11/03/2022   AGRATIO 2.4 (H) 11/03/2022   BILITOT 0.3 11/03/2022   ALKPHOS 74 11/03/2022   AST 13 11/03/2022   ALT 12 11/03/2022   ANIONGAP 11 03/25/2020   Last lipids Lab Results  Component Value Date   CHOL 150 11/03/2022   HDL 51 11/03/2022   LDLCALC 87 11/03/2022   TRIG 55 11/03/2022   CHOLHDL 2.9 11/03/2022   Last hemoglobin A1c Lab Results  Component Value Date   HGBA1C 5.6 11/03/2022   Last thyroid functions Lab Results  Component Value Date   TSH 3.260 11/03/2022   Last vitamin D Lab Results  Component Value Date   VD25OH <4.0 (L) 11/03/2022   Last vitamin B12 and Folate Lab Results  Component Value Date   VITAMINB12 350 11/03/2022   FOLATE 7.7 11/03/2022   The 10-year ASCVD risk score (Arnett DK, et al., 2019) is: 4.7%    Assessment & Plan:   Problem List Items Addressed This Visit       Tobacco use    He continues to smoke 1 pack of cigarettes per day.  He has previously expressed an interest in smoking cessation and wanted to try Nicorette gum.  This was prescribed at his last appointment, however he did not fill  the prescription due to cost.  Today he remains interested in smoking cessation and would still like to try Nicorette gum if he is able to find it at a more affordable cost. -Today he  was provided with a coupon for Nicorette gum that he can take to his pharmacy.  He will notify our office if this does not work and we will explore additional options.      Chronic low back pain    History of chronic low back pain.  He is followed by orthopedic surgery (Dr. Luna Glasgow) who prescribes Norco 5-325 mg every 6 hours as needed for pain relief.  He presented to the emergency department yesterday (2/29) endorsing low back pain no red flag symptoms were identified.  He was provided with prescriptions for meloxicam and Robaxin.  Today he reports that his back pain is significantly better. -Orthopedic surgery follow-up scheduled for 3/12      Vitamin D deficiency - Primary    Vitamin D level undetectably low on labs from January.  High-dose, weekly vitamin D supplementation x 12 weeks was prescribed.  He is currently taking weekly vitamin D supplement as prescribed. -Repeat vitamin D level upon completion of weekly vitamin D supplementation.      Return in about 6 months (around 07/02/2023).   Johnette Abraham, MD

## 2022-12-30 NOTE — Patient Instructions (Signed)
It was a pleasure to see you today.  Thank you for giving [place] the opportunity to be involved in your care.  Below is a brief recap of your visit and next steps.  We will plan to see you again in 6 months.  Summary Please see the GoodRx coupon for nicorette gum Repeat vitamin D level when you finish weekly supplementation Follow up in 6 months

## 2022-12-30 NOTE — Assessment & Plan Note (Signed)
History of chronic low back pain.  He is followed by orthopedic surgery (Dr. Luna Glasgow) who prescribes Norco 5-325 mg every 6 hours as needed for pain relief.  He presented to the emergency department yesterday (2/29) endorsing low back pain no red flag symptoms were identified.  He was provided with prescriptions for meloxicam and Robaxin.  Today he reports that his back pain is significantly better. -Orthopedic surgery follow-up scheduled for 3/12

## 2023-01-10 ENCOUNTER — Ambulatory Visit (INDEPENDENT_AMBULATORY_CARE_PROVIDER_SITE_OTHER): Payer: 59 | Admitting: Orthopaedic Surgery

## 2023-01-10 ENCOUNTER — Encounter: Payer: Self-pay | Admitting: Orthopaedic Surgery

## 2023-01-10 VITALS — BP 110/68 | HR 100 | Ht 66.0 in | Wt 132.0 lb

## 2023-01-10 DIAGNOSIS — M545 Low back pain, unspecified: Secondary | ICD-10-CM

## 2023-01-10 DIAGNOSIS — F17210 Nicotine dependence, cigarettes, uncomplicated: Secondary | ICD-10-CM

## 2023-01-10 MED ORDER — HYDROCODONE-ACETAMINOPHEN 5-325 MG PO TABS: ORAL_TABLET | ORAL | 0 refills | Status: DC

## 2023-01-10 NOTE — Progress Notes (Signed)
My back hurts.  He has chronic lower back pain.  He has no new trauma, no numbness, no weakness.  He is working full time.  Spine/Pelvis examination:  Inspection:  Overall, sacoiliac joint benign and hips nontender; without crepitus or defects.   Thoracic spine inspection: Alignment normal without kyphosis present   Lumbar spine inspection:  Alignment  with normal lumbar lordosis, without scoliosis apparent.   Thoracic spine palpation:  without tenderness of spinal processes   Lumbar spine palpation: without tenderness of lumbar area; without tightness of lumbar muscles    Range of Motion:   Lumbar flexion, forward flexion is normal without pain or tenderness    Lumbar extension is full without pain or tenderness   Left lateral bend is normal without pain or tenderness   Right lateral bend is normal without pain or tenderness   Straight leg raising is normal  Strength & tone: normal   Stability overall normal stability Encounter Diagnoses  Name Primary?   Lumbar pain Yes   Cigarette nicotine dependence without complication    Return in three months.  Call if any problem.  Precautions discussed.  I have reviewed the [facility] web site prior to prescribing narcotic medicine for this patient.  Electronically Signed Sanjuana Kava, MD 3/12/20243:53 PM

## 2023-01-24 ENCOUNTER — Other Ambulatory Visit: Payer: Self-pay | Admitting: Internal Medicine

## 2023-01-24 DIAGNOSIS — E559 Vitamin D deficiency, unspecified: Secondary | ICD-10-CM

## 2023-02-22 ENCOUNTER — Other Ambulatory Visit: Payer: Self-pay | Admitting: Orthopaedic Surgery

## 2023-02-22 NOTE — Telephone Encounter (Signed)
Dr. Sanjuan Dame pt - spoke w/the patient, he is requesting a refill on his Hydrocodone 5.325, 20 quantity, as directed to be sent to [facility] on [address]

## 2023-02-28 MED ORDER — HYDROCODONE-ACETAMINOPHEN 5-325 MG PO TABS: ORAL_TABLET | ORAL | 0 refills | Status: DC

## 2023-04-12 ENCOUNTER — Telehealth: Payer: Self-pay | Admitting: Orthopaedic Surgery

## 2023-04-12 ENCOUNTER — Ambulatory Visit: Payer: 59 | Admitting: Orthopaedic Surgery

## 2023-04-12 NOTE — Telephone Encounter (Signed)
Returned the patient's call, unable to lvm, mailbox is full.  He is wanting to reschedule his missed appointment from today, 6/12.

## 2023-04-18 ENCOUNTER — Ambulatory Visit: Payer: 59 | Admitting: Orthopaedic Surgery

## 2023-04-23 ENCOUNTER — Emergency Department (HOSPITAL_COMMUNITY)
Admission: EM | Admit: 2023-04-23 | Discharge: 2023-04-23 | Disposition: A | Discharge status: Home / Self Care | Payer: 59 | Attending: Emergency Medicine | Admitting: Emergency Medicine

## 2023-04-23 ENCOUNTER — Encounter (HOSPITAL_COMMUNITY): Payer: Self-pay | Admitting: *Deleted

## 2023-04-23 ENCOUNTER — Other Ambulatory Visit: Payer: Self-pay

## 2023-04-23 DIAGNOSIS — J00 Acute nasopharyngitis [common cold]: Secondary | ICD-10-CM | POA: Insufficient documentation

## 2023-04-23 DIAGNOSIS — Z1152 Encounter for screening for COVID-19: Secondary | ICD-10-CM | POA: Insufficient documentation

## 2023-04-23 DIAGNOSIS — R0981 Nasal congestion: Secondary | ICD-10-CM | POA: Diagnosis present

## 2023-04-23 LAB — SARS CORONAVIRUS 2 BY RT PCR: SARS Coronavirus 2 by RT PCR: NEGATIVE

## 2023-04-23 MED ORDER — FEXOFENADINE-PSEUDOEPHED ER 60-120 MG PO TB12: 1.0000 | ORAL_TABLET | Freq: Two times a day (BID) | ORAL | 0 refills | Status: DC

## 2023-04-23 MED ORDER — FLUTICASONE PROPIONATE 50 MCG/ACT NA SUSP: 2.0000 | Freq: Every day | NASAL | 0 refills | Status: DC

## 2023-04-23 NOTE — ED Provider Notes (Signed)
[place] EMERGENCY DEPARTMENT AT [facility] Provider Note   CSN: [id number] Arrival date & time: 04/23/23  1839     History  Chief Complaint  Patient presents with   Sinus Problem    Aaron Spence is a 47 y.o. male.  He denies any past medical history.  Presents the ER with 4 to 5 days of nasal congestion with no fevers, mild cough, occasional clear sputum production with a cough.  No chest pain or shortness of breath.  He tried Alka-Seltzer with mild relief.  He states his most bothersome symptom is the nasal congestion.  He denies headache or fevers, sore throat or ear pain.  No nausea or vomiting or abdominal pain or other complaints.   Sinus Problem       Home Medications Prior to Admission medications   Medication Sig Start Date End Date Taking? Authorizing Provider  fexofenadine-pseudoephedrine (ALLEGRA-D) 60-120 MG 12 hr tablet Take 1 tablet by mouth every 12 (twelve) hours. 04/23/23  Yes Dejha King A, PA-C  fluticasone (FLONASE) 50 MCG/ACT nasal spray Place 2 sprays into both nostrils daily. 04/23/23  Yes Kailei Cowens A, PA-C  albuterol (VENTOLIN HFA) 108 (90 Base) MCG/ACT inhaler SMARTSIG:1 Puff(s) Via Inhaler 4 Times Daily PRN 09/27/22   [provider]  diphenhydrAMINE (BENADRYL) 25 MG tablet Take 1 tablet (25 mg total) by mouth every 6 (six) hours as needed. Medication may cause drowsiness 09/29/22   Darreld Mclean, MD  HYDROcodone-acetaminophen (NORCO/VICODIN) 5-325 MG tablet One tablet every six hours for pain.  Limit 7 days. 02/28/23   Vickki Hearing, MD  meloxicam (MOBIC) 15 MG tablet Take 1 tablet (15 mg total) by mouth daily. 12/29/22   Gilda Crease, MD  methocarbamol (ROBAXIN) 500 MG tablet Take 1 tablet (500 mg total) by mouth every 8 (eight) hours as needed for muscle spasms. 12/29/22   Gilda Crease, MD  nicotine polacrilex (NICORETTE STARTER KIT) 4 MG gum Take 1 each (4 mg total) by mouth as needed for smoking  cessation. 11/03/22   Billie Lade, MD      Allergies    Patient has no known allergies.    Review of Systems   Review of Systems  Physical Exam Updated Vital Signs BP 124/72   Pulse (!) 107   Temp 99.7 F (37.6 C) (Oral)   Resp 18   Ht 5\' 6"  (1.676 m)   Wt 54.4 kg   SpO2 97%   BMI 19.37 kg/m  Physical Exam Vitals and nursing note reviewed.  Constitutional:      General: He is not in acute distress.    Appearance: He is well-developed.  HENT:     Head: Normocephalic and atraumatic.     Nose: Congestion present.     Mouth/Throat:     Mouth: Mucous membranes are moist.  Eyes:     Conjunctiva/sclera: Conjunctivae normal.  Cardiovascular:     Rate and Rhythm: Normal rate and regular rhythm.     Heart sounds: No murmur heard. Pulmonary:     Effort: Pulmonary effort is normal. No respiratory distress.     Breath sounds: Normal breath sounds.  Abdominal:     Palpations: Abdomen is soft.     Tenderness: There is no abdominal tenderness.  Musculoskeletal:        General: No swelling. Normal range of motion.     Cervical back: Neck supple. No tenderness.  Lymphadenopathy:     Cervical: No cervical adenopathy.  Skin:    General: Skin is warm and dry.     Capillary Refill: Capillary refill takes less than 2 seconds.  Neurological:     General: No focal deficit present.     Mental Status: He is alert and oriented to person, place, and time.  Psychiatric:        Mood and Affect: Mood normal.     ED Results / Procedures / Treatments   Labs (all labs ordered are listed, but only abnormal results are displayed) Labs Reviewed  SARS CORONAVIRUS 2 BY RT PCR    EKG None  Radiology No results found.  Procedures Procedures    Medications Ordered in ED Medications - No data to display  ED Course/ Medical Decision Making/ A&P                             Medical Decision Making DDx: Allergic rhinitis, viral URI, bacterial sinusitis, bronchitis, pneumonia,  other ED course: This is a well-appearing otherwise healthy 47 year old man who is here complaining of 4 to 5 days of nasal congestion with a mild cough.  No sinus pain or tenderness on exam, no fevers, no chest pain or shortness of breath, cough is not productive, lungs are clear to auscultation bilaterally.  Discussed with patient likely viral URI versus allergic rhinitis will treat him symptomatically and advised on follow-up and return precautions.           Final Clinical Impression(s) / ED Diagnoses Final diagnoses:  Acute rhinitis    Rx / DC Orders ED Discharge Orders          Ordered    fluticasone (FLONASE) 50 MCG/ACT nasal spray  Daily        04/23/23 1922    fexofenadine-pseudoephedrine (ALLEGRA-D) 60-120 MG 12 hr tablet  Every 12 hours        04/23/23 1922              Josem Kaufmann 04/23/23 1929    Rondel Baton, MD 04/23/23 2116

## 2023-04-23 NOTE — ED Triage Notes (Signed)
Pt with c/o sinus pressure for past few days.  Denies any fever. + nasal congestion. Mild cough per pt.

## 2023-04-23 NOTE — Discharge Instructions (Addendum)
It was a pleasure taking care of you today.  You were seen for nasal congestion.  You are being given a nasal steroid spray and an allergy pill with decongestants to take.  Do not take over-the-counter decongestant such as seltzer or DayQuil at the same time as this.  Drink plenty fluids, follow-up close with your primary care doctor, come back to the ER for new or worsening symptoms.

## 2023-04-28 ENCOUNTER — Telehealth: Payer: Self-pay

## 2023-04-28 NOTE — Telephone Encounter (Signed)
Transition Care Management Unsuccessful Follow-up Telephone Call  Date of discharge and from where:  [facility]  Attempts:  1st Attempt  Reason for unsuccessful TCM follow-up call:  No answer/busy   Lenard Forth [facility] Guide, [facility], [facility] [phone] [address] [place], [place], [place] [address] Phone: [phone] Email: [email or web address]

## 2023-05-01 ENCOUNTER — Telehealth: Payer: Self-pay

## 2023-05-01 NOTE — Telephone Encounter (Signed)
Transition Care Management Unsuccessful Follow-up Telephone Call  Date of discharge and from where:  [facility]  Attempts:  2nd Attempt  Reason for unsuccessful TCM follow-up call:  Left voice message   Lenard Forth [facility] Guide, [facility] [phone] [address] [place], [place], [place] [address] Phone: [phone] Email: [email or web address]

## 2023-05-07 ENCOUNTER — Encounter (HOSPITAL_COMMUNITY): Payer: Self-pay

## 2023-05-07 ENCOUNTER — Other Ambulatory Visit: Payer: Self-pay

## 2023-05-07 ENCOUNTER — Emergency Department (HOSPITAL_COMMUNITY): Payer: 59

## 2023-05-07 ENCOUNTER — Emergency Department (HOSPITAL_COMMUNITY)
Admission: EM | Admit: 2023-05-07 | Discharge: 2023-05-07 | Disposition: A | Discharge status: Home / Self Care | Payer: 59 | Attending: Emergency Medicine | Admitting: Emergency Medicine

## 2023-05-07 DIAGNOSIS — R051 Acute cough: Secondary | ICD-10-CM | POA: Insufficient documentation

## 2023-05-07 DIAGNOSIS — R000 Tachycardia, unspecified: Secondary | ICD-10-CM | POA: Diagnosis not present

## 2023-05-07 DIAGNOSIS — R0789 Other chest pain: Secondary | ICD-10-CM | POA: Diagnosis not present

## 2023-05-07 DIAGNOSIS — R059 Cough, unspecified: Secondary | ICD-10-CM | POA: Diagnosis not present

## 2023-05-07 MED ORDER — PREDNISONE 20 MG PO TABS: ORAL_TABLET | ORAL | 0 refills | Status: DC

## 2023-05-07 MED ORDER — ALBUTEROL SULFATE HFA 108 (90 BASE) MCG/ACT IN AERS
4.0000 | INHALATION_SPRAY | Freq: Once | RESPIRATORY_TRACT | Status: AC
  Administered 2023-05-07: 4 via RESPIRATORY_TRACT
  Filled 2023-05-07: qty 6.7

## 2023-05-07 MED ORDER — IPRATROPIUM-ALBUTEROL 0.5-2.5 (3) MG/3ML IN SOLN: 3.0000 mL | Freq: Once | RESPIRATORY_TRACT | Status: DC

## 2023-05-07 MED ORDER — PREDNISONE 50 MG PO TABS
60.0000 mg | ORAL_TABLET | Freq: Once | ORAL | Status: AC
  Administered 2023-05-07: 60 mg via ORAL
  Filled 2023-05-07: qty 1

## 2023-05-07 NOTE — ED Provider Notes (Signed)
[place] EMERGENCY DEPARTMENT AT [facility] Provider Note   CSN: [id number] Arrival date & time: 05/07/23  0049     History  Chief Complaint  Patient presents with   Cough    MACALISTER SIRMAN is a 47 y.o. male.  47 year old male with a history of emphysema who still smokes presents the ER today with cough.  It is unclear exactly how long it has been going on.  He told nurse spent a week he initially told me a couple days and said maybe a couple weeks.  States it is productive of some whitish-greenish sputum.  No fevers.  Little bit short of breath.  No lower extremity swelling.  Has used inhalers before but does not have one currently.  No sick contacts.   Cough      Home Medications Prior to Admission medications   Medication Sig Start Date End Date Taking? Authorizing Provider  predniSONE (DELTASONE) 20 MG tablet 2 tabs po daily x 4 days 05/07/23  Yes Araceli Coufal, Barbara Cower, MD  albuterol (VENTOLIN HFA) 108 (90 Base) MCG/ACT inhaler SMARTSIG:1 Puff(s) Via Inhaler 4 Times Daily PRN 09/27/22   [provider]  diphenhydrAMINE (BENADRYL) 25 MG tablet Take 1 tablet (25 mg total) by mouth every 6 (six) hours as needed. Medication may cause drowsiness 09/29/22   Darreld Mclean, MD  fexofenadine-pseudoephedrine (ALLEGRA-D) 60-120 MG 12 hr tablet Take 1 tablet by mouth every 12 (twelve) hours. 04/23/23   Carmel Sacramento A, PA-C  fluticasone (FLONASE) 50 MCG/ACT nasal spray Place 2 sprays into both nostrils daily. 04/23/23   Carmel Sacramento A, PA-C  HYDROcodone-acetaminophen (NORCO/VICODIN) 5-325 MG tablet One tablet every six hours for pain.  Limit 7 days. 02/28/23   Vickki Hearing, MD  meloxicam (MOBIC) 15 MG tablet Take 1 tablet (15 mg total) by mouth daily. 12/29/22   Gilda Crease, MD  methocarbamol (ROBAXIN) 500 MG tablet Take 1 tablet (500 mg total) by mouth every 8 (eight) hours as needed for muscle spasms. 12/29/22   Gilda Crease, MD  nicotine  polacrilex (NICORETTE STARTER KIT) 4 MG gum Take 1 each (4 mg total) by mouth as needed for smoking cessation. 11/03/22   Billie Lade, MD      Allergies    Patient has no known allergies.    Review of Systems   Review of Systems  Respiratory:  Positive for cough.     Physical Exam Updated Vital Signs BP 118/83 (BP Location: Left Arm)   Pulse 75   Temp 98.7 F (37.1 C) (Oral)   Resp 17   Ht 5\' 6"  (1.676 m)   Wt 56.7 kg   SpO2 98%   BMI 20.18 kg/m  Physical Exam Vitals and nursing note reviewed.  Constitutional:      Appearance: He is well-developed.  HENT:     Head: Normocephalic and atraumatic.  Eyes:     Pupils: Pupils are equal, round, and reactive to light.  Cardiovascular:     Rate and Rhythm: Tachycardia present.  Pulmonary:     Effort: Pulmonary effort is normal. No respiratory distress.     Breath sounds: Decreased air movement present.  Abdominal:     General: Abdomen is flat. There is no distension.  Musculoskeletal:        General: Normal range of motion.     Cervical back: Normal range of motion.  Skin:    General: Skin is warm and dry.  Neurological:     General:  No focal deficit present.     Mental Status: He is alert.     ED Results / Procedures / Treatments   Labs (all labs ordered are listed, but only abnormal results are displayed) Labs Reviewed - No data to display  EKG EKG Interpretation Date/Time:  Sunday May 07 2023 01:11:53 EDT Ventricular Rate:  102 PR Interval:  158 QRS Duration:  76 QT Interval:  328 QTC Calculation: 428 R Axis:   84  Text Interpretation: Sinus tachycardia Confirmed by Keevon Henney ([id number]) on 05/07/2023 1:32:32 AM  Radiology DG Chest 2 View  Result Date: 05/07/2023 CLINICAL DATA:  Cough. EXAM: CHEST - 2 VIEW COMPARISON:  08/20/2022. FINDINGS: The heart size and mediastinal contours are within normal limits. Hyperinflation of the lungs is noted. No consolidation, effusion, or pneumothorax. Mild  degenerative changes are noted in the thoracic spine. No acute osseous abnormality. IMPRESSION: Hyperinflation of the lungs with no active cardiopulmonary disease. Electronically Signed   By: Laura  Taylor M.D.   On: 05/07/2023 02:14    Procedures Procedures    Medications Ordered in ED Medications  predniSONE (DELTASONE) tablet 60 mg (60 mg Oral Given 05/07/23 0439)  albuterol (VENTOLIN HFA) 108 (90 Base) MCG/ACT inhaler 4 puff (4 puffs Inhalation Given 05/07/23 0507)    ED Course/ Medical Decision Making/ A&P                             Medical Decision Making Amount and/or Complexity of Data Reviewed Radiology: ordered. ECG/medicine tests: ordered.  Risk Prescription drug management.   Here with likely bronchitis.  Treated for same.  No indication for antibiotics at this time.  Patient with stable vital signs while here.  No evidence of sepsis.  No evidence of hypoxic respiratory failure.  Patient stable for discharge on steroids and albuterol inhaler replaced.   Final Clinical Impression(s) / ED Diagnoses Final diagnoses:  Acute cough    Rx / DC Orders ED Discharge Orders          Ordered    predniSONE (DELTASONE) 20 MG tablet        07 /07/24 0504              Khristen Cheyney, Barbara Cower, MD 05/07/23 [phone]

## 2023-05-07 NOTE — ED Triage Notes (Signed)
Patient from home for chest congestion and cough that started about 1 week ago. Reports it is a productive cough; sharp pain in R ribs. Denies any fevers or chills. Upon arrival to ER, patient is alert and oriented, ambu

## 2023-05-12 ENCOUNTER — Telehealth: Payer: Self-pay | Admitting: *Deleted

## 2023-05-12 NOTE — Telephone Encounter (Signed)
Transition Care Management Unsuccessful Follow-up Telephone Call  Date of discharge and from where:  [facility]  Attempts:  1st Attempt  Reason for unsuccessful TCM follow-up call:  Left voice message

## 2023-05-16 ENCOUNTER — Telehealth: Payer: Self-pay | Admitting: *Deleted

## 2023-05-16 NOTE — Telephone Encounter (Signed)
Transition Care Management Unsuccessful Follow-up Telephone Call  Date of discharge and from where:  [facility]  Attempts:  2nd Attempt  Reason for unsuccessful TCM follow-up call:  Left voice message

## 2023-06-27 ENCOUNTER — Ambulatory Visit: Payer: 59 | Admitting: Orthopaedic Surgery

## 2023-06-28 ENCOUNTER — Ambulatory Visit (INDEPENDENT_AMBULATORY_CARE_PROVIDER_SITE_OTHER): Payer: 59 | Admitting: Orthopaedic Surgery

## 2023-06-28 ENCOUNTER — Encounter: Payer: Self-pay | Admitting: Orthopaedic Surgery

## 2023-06-28 VITALS — BP 108/73 | HR 73

## 2023-06-28 DIAGNOSIS — G8929 Other chronic pain: Secondary | ICD-10-CM

## 2023-06-28 DIAGNOSIS — F17210 Nicotine dependence, cigarettes, uncomplicated: Secondary | ICD-10-CM

## 2023-06-28 DIAGNOSIS — M25511 Pain in right shoulder: Secondary | ICD-10-CM

## 2023-06-28 MED ORDER — HYDROCODONE-ACETAMINOPHEN 5-325 MG PO TABS: ORAL_TABLET | ORAL | 0 refills | Status: DC

## 2023-06-28 NOTE — Progress Notes (Signed)
My shoulder has acted up.  He has had more pain in the right shoulder recently.  He has no trauma.  He is working at [employer] now.  Right shoulder has good ROM with pain in the extremes.  NV intact. ROM of neck is full.    Encounter Diagnoses  Name Primary?   Chronic right shoulder pain Yes   Cigarette nicotine dependence without complication    I have reviewed the [place] Controlled Substance Reporting System web site prior to prescribing narcotic medicine for this patient.  Return in three months.  Call if any problem.  Precautions discussed.  Electronically Signed Darreld Mclean, MD 8/28/202410:26 AM

## 2023-07-13 ENCOUNTER — Ambulatory Visit: Payer: 59 | Admitting: Internal Medicine

## 2023-07-19 ENCOUNTER — Ambulatory Visit: Payer: 59 | Admitting: Internal Medicine

## 2023-08-10 ENCOUNTER — Ambulatory Visit: Payer: 59 | Admitting: Internal Medicine

## 2023-08-29 ENCOUNTER — Telehealth: Payer: Self-pay | Admitting: Orthopaedic Surgery

## 2023-08-29 NOTE — Telephone Encounter (Signed)
Dr. Sanjuan Dame pt - spoke w/the patient, he is requesting a refill on Hydrocodone 5-325 to be sent to [facility] on [address]

## 2023-08-30 MED ORDER — HYDROCODONE-ACETAMINOPHEN 5-325 MG PO TABS: ORAL_TABLET | ORAL | 0 refills | Status: DC

## 2023-09-21 ENCOUNTER — Encounter: Payer: Self-pay | Admitting: Internal Medicine

## 2023-09-21 ENCOUNTER — Ambulatory Visit (INDEPENDENT_AMBULATORY_CARE_PROVIDER_SITE_OTHER): Payer: 59 | Admitting: Internal Medicine

## 2023-09-21 VITALS — BP 109/63 | HR 79 | Ht 66.0 in | Wt 131.6 lb

## 2023-09-21 DIAGNOSIS — Z1211 Encounter for screening for malignant neoplasm of colon: Secondary | ICD-10-CM | POA: Insufficient documentation

## 2023-09-21 DIAGNOSIS — Z720 Tobacco use: Secondary | ICD-10-CM

## 2023-09-21 DIAGNOSIS — E559 Vitamin D deficiency, unspecified: Secondary | ICD-10-CM

## 2023-09-21 MED ORDER — ALBUTEROL SULFATE HFA 108 (90 BASE) MCG/ACT IN AERS: 1.0000 | INHALATION_SPRAY | Freq: Four times a day (QID) | RESPIRATORY_TRACT | 3 refills | Status: DC | PRN

## 2023-09-21 NOTE — Progress Notes (Signed)
Established Patient Office Visit  Subjective   Patient ID: Aaron Spence, male    DOB: 19-May-1976  Age: 47 y.o. MRN: [id number]  Chief Complaint  Patient presents with   Follow-up    Six month follow up    Mr. Aaron Spence returns to care today for routine follow-up.  He was last evaluated by me on 3/1.  No medication changes were made at that time and 21-month follow-up was arranged.  In the interim, he has been evaluated by orthopedic surgery on multiple occasions for management of chronic musculoskeletal pain, 8/28 most recently.  He has also presented to the emergency department on 2 occasions endorsing upper respiratory symptoms.  There have otherwise been no acute interval events. Mr. Aaron Spence reports feeling well today.  He is asymptomatic and has no acute concerns to discuss.  Past Medical History:  Diagnosis Date   Bronchitis    Cancer (HCC)    Tobacco use    History reviewed. No pertinent surgical history. Social History   Tobacco Use   Smoking status: Every Day    Current packs/day: 1.00    Average packs/day: 1 pack/day for 20.0 years (20.0 ttl pk-yrs)    Types: Cigarettes   Smokeless tobacco: Never  Vaping Use   Vaping status: Never Used  Substance Use Topics   Alcohol use: Not Currently    Comment: denies use 04/23/23   Drug use: Not Currently    Types: Marijuana    Comment: denies use 04/23/23   Family History  Problem Relation Age of Onset   Bronchiolitis Other    No Known Allergies  Review of Systems  Constitutional:  Negative for chills and fever.  HENT:  Negative for sore throat.   Respiratory:  Negative for cough and shortness of breath.   Cardiovascular:  Negative for chest pain, palpitations and leg swelling.  Gastrointestinal:  Negative for abdominal pain, blood in stool, constipation, diarrhea, nausea and vomiting.  Genitourinary:  Negative for dysuria and hematuria.  Musculoskeletal:  Negative for myalgias.  Skin:  Negative for itching and rash.   Neurological:  Negative for dizziness and headaches.  Psychiatric/Behavioral:  Negative for depression and suicidal ideas.      Objective:     BP 109/63 (BP Location: Right Arm, Patient Position: Sitting, Cuff Size: Normal)   Pulse 79   Ht 5\' 6"  (1.676 m)   Wt 131 lb 9.6 oz (59.7 kg)   SpO2 98%   BMI 21.24 kg/m  BP Readings from Last 3 Encounters:  09/21/23 109/63  06/28/23 108/73  05/07/23 118/83   Physical Exam Vitals reviewed.  Constitutional:      General: He is not in acute distress.    Appearance: Normal appearance. He is not ill-appearing.  HENT:     Head: Normocephalic and atraumatic.     Right Ear: External ear normal.     Left Ear: External ear normal.     Nose: Nose normal. No congestion or rhinorrhea.     Mouth/Throat:     Mouth: Mucous membranes are moist.     Pharynx: Oropharynx is clear.  Eyes:     General: No scleral icterus.    Extraocular Movements: Extraocular movements intact.     Conjunctiva/sclera: Conjunctivae normal.     Pupils: Pupils are equal, round, and reactive to light.  Cardiovascular:     Rate and Rhythm: Normal rate and regular rhythm.     Pulses: Normal pulses.     Heart sounds: Normal heart sounds.  No murmur heard. Pulmonary:     Effort: Pulmonary effort is normal.     Breath sounds: Normal breath sounds. No wheezing, rhonchi or rales.  Abdominal:     General: Abdomen is flat. Bowel sounds are normal. There is no distension.     Palpations: Abdomen is soft.     Tenderness: There is no abdominal tenderness.  Musculoskeletal:        General: No swelling or deformity. Normal range of motion.     Cervical back: Normal range of motion.  Skin:    General: Skin is warm and dry.     Capillary Refill: Capillary refill takes less than 2 seconds.  Neurological:     General: No focal deficit present.     Mental Status: He is alert and oriented to person, place, and time.     Motor: No weakness.  Psychiatric:        Mood and Affect:  Mood normal.        Behavior: Behavior normal.        Thought Content: Thought content normal.   Last CBC Lab Results  Component Value Date   WBC 5.7 11/03/2022   HGB 14.6 11/03/2022   HCT 42.0 11/03/2022   MCV 85 11/03/2022   MCH 29.4 11/03/2022   RDW 13.5 11/03/2022   PLT 316 11/03/2022   Last metabolic panel Lab Results  Component Value Date   GLUCOSE 88 11/03/2022   NA 144 11/03/2022   K 4.1 11/03/2022   CL 105 11/03/2022   CO2 24 11/03/2022   BUN 14 11/03/2022   CREATININE 1.09 11/03/2022   EGFR 86 11/03/2022   CALCIUM 9.4 11/03/2022   PROT 6.5 11/03/2022   ALBUMIN 4.6 11/03/2022   LABGLOB 1.9 11/03/2022   AGRATIO 2.4 (H) 11/03/2022   BILITOT 0.3 11/03/2022   ALKPHOS 74 11/03/2022   AST 13 11/03/2022   ALT 12 11/03/2022   ANIONGAP 11 03/25/2020   Last lipids Lab Results  Component Value Date   CHOL 150 11/03/2022   HDL 51 11/03/2022   LDLCALC 87 11/03/2022   TRIG 55 11/03/2022   CHOLHDL 2.9 11/03/2022   Last hemoglobin A1c Lab Results  Component Value Date   HGBA1C 5.6 11/03/2022   Last thyroid functions Lab Results  Component Value Date   TSH 3.260 11/03/2022   Last vitamin D Lab Results  Component Value Date   VD25OH <4.0 (L) 11/03/2022   Last vitamin B12 and Folate Lab Results  Component Value Date   VITAMINB12 350 11/03/2022   FOLATE 7.7 11/03/2022   The 10-year ASCVD risk score (Arnett DK, et al., 2019) is: 4.8%    Assessment & Plan:   Problem List Items Addressed This Visit       Tobacco use    He continues to smoke 1 pack/day of cigarettes.  He has previously tried to quit but is currently not interested in cessation. -The patient was counseled on the dangers of tobacco use, and was advised to quit.  Reviewed strategies to maximize success, including removing cigarettes and smoking materials from environment, stress management, substitution of other forms of reinforcement, support of family/friends, and written materials.        Vitamin D deficiency    Noted on previous labs.  He has completed high-dose, weekly vitamin D supplementation.  Repeat vitamin D level ordered today.      Colon cancer screening - Primary    Gastroenterology referral placed today for screening colonoscopy  Return in about 6 months (around 03/20/2024).   Billie Lade, MD

## 2023-09-21 NOTE — Assessment & Plan Note (Signed)
Gastroenterology referral placed today for screening colonoscopy. 

## 2023-09-21 NOTE — Assessment & Plan Note (Signed)
Noted on previous labs.  He has completed high-dose, weekly vitamin D supplementation. -Repeat vitamin D level ordered today 

## 2023-09-21 NOTE — Patient Instructions (Signed)
It was a pleasure to see you today.  Thank you for giving [place] the opportunity to be involved in your care.  Below is a brief recap of your visit and next steps.  We will plan to see you again in 6 months.  Summary No medication changes today Repeat vitamin D level ordered Referral to gastroenterology placed for colonoscopy Follow up in 6 months. Continue to work on smoking cessation

## 2023-09-21 NOTE — Assessment & Plan Note (Signed)
He continues to smoke 1 pack/day of cigarettes.  He has previously tried to quit but is currently not interested in cessation. -The patient was counseled on the dangers of tobacco use, and was advised to quit.  Reviewed strategies to maximize success, including removing cigarettes and smoking materials from environment, stress management, substitution of other forms of reinforcement, support of family/friends, and written materials.

## 2023-09-22 ENCOUNTER — Other Ambulatory Visit: Payer: Self-pay | Admitting: Internal Medicine

## 2023-09-22 ENCOUNTER — Encounter: Payer: Self-pay | Admitting: Internal Medicine

## 2023-09-22 DIAGNOSIS — E559 Vitamin D deficiency, unspecified: Secondary | ICD-10-CM

## 2023-09-22 LAB — VITAMIN D 25 HYDROXY (VIT D DEFICIENCY, FRACTURES): Vit D, 25-Hydroxy: 17.5 ng/mL — ABNORMAL LOW (ref 30.0–100.0)

## 2023-09-22 MED ORDER — VITAMIN D (ERGOCALCIFEROL) 1.25 MG (50000 UNIT) PO CAPS
50000.0000 [IU] | ORAL_CAPSULE | ORAL | 0 refills | Status: AC
Start: 2023-09-22 — End: ?

## 2023-09-26 ENCOUNTER — Encounter: Payer: Self-pay | Admitting: *Deleted

## 2023-09-27 ENCOUNTER — Ambulatory Visit: Payer: 59 | Admitting: Orthopaedic Surgery

## 2023-10-09 ENCOUNTER — Other Ambulatory Visit: Payer: Self-pay

## 2023-10-09 ENCOUNTER — Encounter (HOSPITAL_COMMUNITY): Payer: Self-pay | Admitting: Emergency Medicine

## 2023-10-09 ENCOUNTER — Emergency Department (HOSPITAL_COMMUNITY)
Admission: EM | Admit: 2023-10-09 | Discharge: 2023-10-09 | Disposition: A | Discharge status: Home / Self Care | Payer: 59 | Attending: Emergency Medicine | Admitting: Emergency Medicine

## 2023-10-09 DIAGNOSIS — Z859 Personal history of malignant neoplasm, unspecified: Secondary | ICD-10-CM | POA: Insufficient documentation

## 2023-10-09 DIAGNOSIS — T7840XA Allergy, unspecified, initial encounter: Secondary | ICD-10-CM | POA: Insufficient documentation

## 2023-10-09 DIAGNOSIS — F17210 Nicotine dependence, cigarettes, uncomplicated: Secondary | ICD-10-CM | POA: Insufficient documentation

## 2023-10-09 DIAGNOSIS — M79632 Pain in left forearm: Secondary | ICD-10-CM | POA: Insufficient documentation

## 2023-10-09 DIAGNOSIS — R0981 Nasal congestion: Secondary | ICD-10-CM | POA: Diagnosis present

## 2023-10-09 MED ORDER — CLARITIN 5 MG PO CHEW: 5.0000 mg | CHEWABLE_TABLET | Freq: Every day | ORAL | 0 refills | Status: DC
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
## 2023-10-09 NOTE — Discharge Instructions (Signed)
You were evaluated in the Emergency Department and after careful evaluation, we did not find any emergent condition requiring admission or further testing in the hospital.  Your exam/testing today is overall reassuring.  Can use the Claritin daily to help with your allergies.  Recommend follow-up with a primary care doctor.  Please return to the Emergency Department if you experience any worsening of your condition.   Thank you for allowing [place] to be a part of your care.
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
## 2023-11-12 DIAGNOSIS — N341 Nonspecific urethritis: Secondary | ICD-10-CM | POA: Insufficient documentation

## 2023-11-12 DIAGNOSIS — R369 Urethral discharge, unspecified: Secondary | ICD-10-CM | POA: Diagnosis present

## 2023-11-12 DIAGNOSIS — N342 Other urethritis: Secondary | ICD-10-CM | POA: Diagnosis not present

## 2023-11-13 ENCOUNTER — Other Ambulatory Visit: Payer: Self-pay

## 2023-11-13 ENCOUNTER — Encounter (HOSPITAL_COMMUNITY): Payer: Self-pay | Admitting: Emergency Medicine

## 2023-11-13 ENCOUNTER — Emergency Department (HOSPITAL_COMMUNITY)
Admission: EM | Admit: 2023-11-13 | Discharge: 2023-11-13 | Disposition: A | Discharge status: Home / Self Care | Payer: 59 | Attending: Emergency Medicine | Admitting: Emergency Medicine

## 2023-11-13 DIAGNOSIS — N341 Nonspecific urethritis: Secondary | ICD-10-CM | POA: Diagnosis not present

## 2023-11-13 DIAGNOSIS — N342 Other urethritis: Secondary | ICD-10-CM

## 2023-11-13 LAB — URINALYSIS, ROUTINE W REFLEX MICROSCOPIC
Bacteria, UA: NONE SEEN
Bilirubin Urine: NEGATIVE
Glucose, UA: NEGATIVE mg/dL
Ketones, ur: NEGATIVE mg/dL
Nitrite: NEGATIVE
Protein, ur: 30 mg/dL — AB
RBC / HPF: >50 RBC/hpf (ref 0–5)
Specific Gravity, Urine: 1.03 (ref 1.005–1.030)
pH: 5 (ref 5.0–8.0)

## 2023-11-13 MED ORDER — STERILE WATER FOR INJECTION IJ SOLN
INTRAMUSCULAR | Status: AC
  Administered 2023-11-13: 10 mL
  Filled 2023-11-13: qty 10

## 2023-11-13 MED ORDER — DOXYCYCLINE HYCLATE 100 MG PO CAPS: 100.0000 mg | ORAL_CAPSULE | Freq: Two times a day (BID) | ORAL | 0 refills | Status: AC

## 2023-11-13 MED ORDER — CEFTRIAXONE SODIUM 500 MG IJ SOLR
500.0000 mg | Freq: Once | INTRAMUSCULAR | Status: AC
  Administered 2023-11-13: 500 mg via INTRAMUSCULAR
  Filled 2023-11-13: qty 500

## 2023-11-13 NOTE — ED Provider Notes (Signed)
 [place] EMERGENCY DEPARTMENT AT [facility] Provider Note   CSN: [id number] Arrival date & time: 11/12/23  2336     History  Chief Complaint  Patient presents with   Penile Discharge    Aaron Spence is a 48 y.o. male.  Patient presents to the emergency department for evaluation of dysuria and urethral discharge.  No testicular pain, swelling.  No external lesions.       Home Medications Prior to Admission medications   Medication Sig Start Date End Date Taking? Authorizing Provider  doxycycline  (VIBRAMYCIN ) 100 MG capsule Take 1 capsule (100 mg total) by mouth 2 (two) times daily. 11/13/23  Yes Jamarie Joplin, Lonni PARAS, MD  albuterol  (VENTOLIN  HFA) 108 (90 Base) MCG/ACT inhaler Inhale 1 puff into the lungs every 6 (six) hours as needed for wheezing or shortness of breath. 09/21/23   Melvenia Manus BRAVO, MD  diphenhydrAMINE  (BENADRYL ) 25 MG tablet Take 1 tablet (25 mg total) by mouth every 6 (six) hours as needed. Medication may cause drowsiness 09/29/22   Brenna Lin, MD  fluticasone  (FLONASE ) 50 MCG/ACT nasal spray Place 2 sprays into both nostrils daily. 04/23/23   Suellen Cantor A, PA-C  HYDROcodone -acetaminophen  (NORCO/VICODIN) 5-325 MG tablet One tablet every six hours for pain.  Limit 7 days. 08/30/23   Brenna Lin, MD  loratadine  (CLARITIN ) 5 MG chewable tablet Chew 1 tablet (5 mg total) by mouth daily. 10/09/23   Theadore Ozell CHRISTELLA, MD  meloxicam  (MOBIC ) 15 MG tablet Take 1 tablet (15 mg total) by mouth daily. 12/29/22   Haze Lonni PARAS, MD  methocarbamol  (ROBAXIN ) 500 MG tablet Take 1 tablet (500 mg total) by mouth every 8 (eight) hours as needed for muscle spasms. 12/29/22   Haze Lonni PARAS, MD  nicotine  polacrilex (NICORETTE  STARTER KIT) 4 MG gum Take 1 each (4 mg total) by mouth as needed for smoking cessation. 11/03/22   Melvenia Manus BRAVO, MD  predniSONE  (DELTASONE ) 20 MG tablet 2 tabs po daily x 4 days 05/07/23   Mesner, Selinda, MD  Vitamin D ,  Ergocalciferol , (DRISDOL ) 1.25 MG (50000 UNIT) CAPS capsule Take 1 capsule (50,000 Units total) by mouth every 7 (seven) days. 09/22/23   Melvenia Manus BRAVO, MD      Allergies    Patient has no known allergies.    Review of Systems   Review of Systems  Physical Exam Updated Vital Signs BP 123/80 (BP Location: Right Arm)   Pulse 95   Temp 98.4 F (36.9 C) (Oral)   Resp 16   Ht 5' 6 (1.676 m)   Wt 56.7 kg   SpO2 98%   BMI 20.18 kg/m  Physical Exam Vitals and nursing note reviewed.  Constitutional:      Appearance: Normal appearance.  Eyes:     Pupils: Pupils are equal, round, and reactive to light.  Cardiovascular:     Rate and Rhythm: Normal rate.  Pulmonary:     Effort: Pulmonary effort is normal.  Abdominal:     Tenderness: There is no abdominal tenderness.  Neurological:     General: No focal deficit present.     Mental Status: He is alert.  Psychiatric:        Mood and Affect: Mood normal.     ED Results / Procedures / Treatments   Labs (all labs ordered are listed, but only abnormal results are displayed) Labs Reviewed  URINALYSIS, ROUTINE W REFLEX MICROSCOPIC  GC/CHLAMYDIA PROBE AMP ([address]) NOT AT [facility]  EKG None  Radiology No results found.  Procedures Procedures    Medications Ordered in ED Medications  cefTRIAXone  (ROCEPHIN ) injection 500 mg (has no administration in time range)    ED Course/ Medical Decision Making/ A&P                                 Medical Decision Making Amount and/or Complexity of Data Reviewed Labs: ordered.  Risk Prescription drug management.   Patient with urethral discharge and dysuria likely secondary to STD.  GC and Chlamydia testing.  Treat empirically.        Final Clinical Impression(s) / ED Diagnoses Final diagnoses:  Urethritis    Rx / DC Orders ED Discharge Orders          Ordered    doxycycline  (VIBRAMYCIN ) 100 MG capsule  2 times daily        11/13/23 0105               Haze Lonni PARAS, MD 11/13/23 0105

## 2023-11-13 NOTE — ED Triage Notes (Addendum)
 Pt states he has "a little discharge going on" from his penis. When asked if discharge had color to it, pt replied the color was "like cum". Thinks he may have a UTI. C/o "stinging" with urination.

## 2023-11-17 LAB — GC/CHLAMYDIA PROBE AMP (~~LOC~~) NOT AT ARMC
Chlamydia: NEGATIVE
Comment: NEGATIVE
Comment: NORMAL
Neisseria Gonorrhea: POSITIVE — AB

## 2023-11-20 ENCOUNTER — Ambulatory Visit: Payer: 59

## 2023-11-21 ENCOUNTER — Encounter (INDEPENDENT_AMBULATORY_CARE_PROVIDER_SITE_OTHER): Payer: Self-pay | Admitting: *Deleted

## 2023-11-21 ENCOUNTER — Ambulatory Visit (INDEPENDENT_AMBULATORY_CARE_PROVIDER_SITE_OTHER): Payer: 59

## 2023-11-21 VITALS — Ht 66.0 in | Wt 125.0 lb

## 2023-11-21 DIAGNOSIS — F17210 Nicotine dependence, cigarettes, uncomplicated: Secondary | ICD-10-CM

## 2023-11-21 DIAGNOSIS — Z87891 Personal history of nicotine dependence: Secondary | ICD-10-CM

## 2023-11-21 DIAGNOSIS — Z0100 Encounter for examination of eyes and vision without abnormal findings: Secondary | ICD-10-CM

## 2023-11-21 DIAGNOSIS — Z0000 Encounter for general adult medical examination without abnormal findings: Secondary | ICD-10-CM | POA: Diagnosis not present

## 2023-11-21 DIAGNOSIS — Z1211 Encounter for screening for malignant neoplasm of colon: Secondary | ICD-10-CM

## 2023-11-21 DIAGNOSIS — Z720 Tobacco use: Secondary | ICD-10-CM

## 2023-11-21 NOTE — Progress Notes (Signed)
Because this visit was a virtual/telehealth visit,  certain criteria was not obtained, such a blood pressure, CBG if applicable, and timed get up and go. Any medications not marked as "taking" were not mentioned during the medication reconciliation part of the visit. Any vitals not documented were not able to be obtained due to this being a telehealth visit or patient was unable to self-report a recent blood pressure reading due to a lack of equipment at home via telehealth. Vitals that have been documented are verbally provided by the patient.  Interactive audio and video telecommunications were attempted between this provider and patient, however failed, due to patient having technical difficulties OR patient did not have access to video capability.  We continued and completed visit with audio only.  Subjective:   Aaron Spence is a 48 y.o. male who presents for Medicare Annual/Subsequent preventive examination.  Visit Complete: Virtual I connected with  Aaron Spence on 11/21/23 by a audio enabled telemedicine application and verified that I am speaking with the correct person using two identifiers.  Patient Location: Home  Provider Location: Home Office  I discussed the limitations of evaluation and management by telemedicine. The patient expressed understanding and agreed to proceed.  Vital Signs: Because this visit was a virtual/telehealth visit, some criteria may be missing or patient reported. Any vitals not documented were not able to be obtained and vitals that have been documented are patient reported.  Patient Medicare AWV questionnaire was completed by the patient on na; I have confirmed that all information answered by patient is correct and no changes since this date.  Cardiac Risk Factors include: advanced age (>39men, >54 women);male gender;sedentary lifestyle;smoking/ tobacco exposure     Objective:    Today's Vitals   11/21/23 1554  Weight: 125 lb (56.7 kg)  Height:  5\' 6"  (1.676 m)  PainSc: 0-No pain   Body mass index is 20.18 kg/m.     11/21/2023    3:57 PM 12/29/2022   10:46 PM 11/04/2022    4:28 PM 08/23/2022    7:00 PM 08/20/2022    7:15 PM 07/17/2022   12:05 AM 01/30/2022   10:45 PM  Advanced Directives  Does Patient Have a Medical Advance Directive? No No Yes No No No No  Does patient want to make changes to medical advance directive?   No - Patient declined      Would patient like information on creating a medical advance directive? No - Patient declined    No - Patient declined No - Patient declined No - Patient declined    Current Medications (verified) Outpatient Encounter Medications as of 11/21/2023  Medication Sig   albuterol (VENTOLIN HFA) 108 (90 Base) MCG/ACT inhaler Inhale 1 puff into the lungs every 6 (six) hours as needed for wheezing or shortness of breath.   diphenhydrAMINE (BENADRYL) 25 MG tablet Take 1 tablet (25 mg total) by mouth every 6 (six) hours as needed. Medication may cause drowsiness   doxycycline (VIBRAMYCIN) 100 MG capsule Take 1 capsule (100 mg total) by mouth 2 (two) times daily.   fluticasone (FLONASE) 50 MCG/ACT nasal spray Place 2 sprays into both nostrils daily.   HYDROcodone-acetaminophen (NORCO/VICODIN) 5-325 MG tablet One tablet every six hours for pain.  Limit 7 days.   loratadine (CLARITIN) 5 MG chewable tablet Chew 1 tablet (5 mg total) by mouth daily.   meloxicam (MOBIC) 15 MG tablet Take 1 tablet (15 mg total) by mouth daily.   methocarbamol (ROBAXIN) 500 MG  tablet Take 1 tablet (500 mg total) by mouth every 8 (eight) hours as needed for muscle spasms.   nicotine polacrilex (NICORETTE STARTER KIT) 4 MG gum Take 1 each (4 mg total) by mouth as needed for smoking cessation.   predniSONE (DELTASONE) 20 MG tablet 2 tabs po daily x 4 days   Vitamin D, Ergocalciferol, (DRISDOL) 1.25 MG (50000 UNIT) CAPS capsule Take 1 capsule (50,000 Units total) by mouth every 7 (seven) days.   No facility-administered  encounter medications on file as of 11/21/2023.    Allergies (verified) Patient has no known allergies.   History: Past Medical History:  Diagnosis Date   Bronchitis    Cancer (HCC)    Tobacco use    History reviewed. No pertinent surgical history. Family History  Problem Relation Age of Onset   Bronchiolitis Other    Social History   Socioeconomic History   Marital status: Single    Spouse name: Not on file   Number of children: Not on file   Years of education: Not on file   Highest education level: Not on file  Occupational History   Not on file  Tobacco Use   Smoking status: Every Day    Current packs/day: 1.00    Average packs/day: 1 pack/day for 20.0 years (20.0 ttl pk-yrs)    Types: Cigarettes   Smokeless tobacco: Never  Vaping Use   Vaping status: Never Used  Substance and Sexual Activity   Alcohol use: Not Currently    Comment: denies use 04/23/23   Drug use: Not Currently    Types: Marijuana    Comment: denies use 04/23/23   Sexual activity: Not on file  Other Topics Concern   Not on file  Social History Narrative   ** Merged History Encounter **       Social Drivers of Health   Financial Resource Strain: Low Risk  (11/21/2023)   Overall Financial Resource Strain (CARDIA)    Difficulty of Paying Living Expenses: Not hard at all  Food Insecurity: No Food Insecurity (11/21/2023)   Hunger Vital Sign    Worried About Running Out of Food in the Last Year: Never true    Ran Out of Food in the Last Year: Never true  Transportation Needs: No Transportation Needs (11/21/2023)   PRAPARE - Administrator, Civil Service (Medical): No    Lack of Transportation (Non-Medical): No  Physical Activity: Insufficiently Active (11/21/2023)   Exercise Vital Sign    Days of Exercise per Week: 1 day    Minutes of Exercise per Session: 10 min  Stress: No Stress Concern Present (11/21/2023)   [employer] of Occupational Health - Occupational Stress  Questionnaire    Feeling of Stress : Not at all  Social Connections: Moderately Integrated (11/21/2023)   Social Connection and Isolation Panel [NHANES]    Frequency of Communication with Friends and Family: More than three times a week    Frequency of Social Gatherings with Friends and Family: More than three times a week    Attends Religious Services: More than 4 times per year    Active Member of [employer] or Organizations: Yes    Attends Engineer, structural: More than 4 times per year    Marital Status: Never married    Tobacco Counseling Ready to quit: Yes Counseling given: Yes   Clinical Intake:  Pre-visit preparation completed: Yes  Pain : No/denies pain Pain Score: 0-No pain     BMI -  recorded: 20.18 Nutritional Status: BMI of 19-24  Normal Nutritional Risks: None Diabetes: No  How often do you need to have someone help you when you read instructions, pamphlets, or other written materials from your doctor or pharmacy?: 3 - Sometimes  Interpreter Needed?: No  Information entered by :: Aaron Spence   Activities of Daily Living    11/21/2023    3:56 PM  In your present state of health, do you have any difficulty performing the following activities:  Hearing? 0  Vision? 0  Difficulty concentrating or making decisions? 0  Walking or climbing stairs? 0  Dressing or bathing? 0  Doing errands, shopping? 0  Preparing Food and eating ? N  Using the Toilet? N  In the past six months, have you accidently leaked urine? N  Do you have problems with loss of bowel control? N  Managing your Medications? N  Managing your Finances? N  Housekeeping or managing your Housekeeping? N    Patient Care Team: Billie Lade, MD as PCP - General (Internal Medicine) Benetta Spar, MD (Internal Medicine)  Indicate any recent Medical Services you may have received from other than [facility] providers in the past year (date may be approximate).     Assessment:   This  is a routine wellness examination for Aaron Spence.  Hearing/Vision screen Hearing Screening - Comments:: Patient denies any hearing difficulties.   Vision Screening - Comments:: No eye doctor. Not utd on exams. Referral placed for patient today   Goals Addressed             This Visit's Progress    Patient Stated       Stay healthy and active       Depression Screen    11/21/2023    3:59 PM 09/21/2023    3:12 PM 12/30/2022    2:34 PM 11/04/2022    4:28 PM 11/04/2022    4:27 PM 11/03/2022    8:34 AM  PHQ 2/9 Scores  PHQ - 2 Score 0 0 0 0 0 0  PHQ- 9 Score 0         Fall Risk    11/21/2023    3:58 PM 09/21/2023    3:12 PM 12/30/2022    2:34 PM 11/04/2022    4:28 PM 11/03/2022    8:34 AM  Fall Risk   Falls in the past year? 0 0 0 0 0  Number falls in past yr: 0 0 0  0  Injury with Fall? 0 0 0  0  Risk for fall due to : No Fall Risks No Fall Risks No Fall Risks  No Fall Risks  Follow up Falls prevention discussed Falls evaluation completed Falls evaluation completed  Falls evaluation completed    MEDICARE RISK AT HOME: Medicare Risk at Home Any stairs in or around the home?: No If so, are there any without handrails?: No Home free of loose throw rugs in walkways, pet beds, electrical cords, etc?: Yes Adequate lighting in your home to reduce risk of falls?: Yes Life alert?: No Use of a cane, walker or w/c?: No Grab bars in the bathroom?: No Elevated toilet seat or a handicapped toilet?: No  TIMED UP AND GO:  Was the test performed?  No    Cognitive Function:        11/21/2023    3:59 PM 11/04/2022    4:29 PM  6CIT Screen  What Year? 0 points 0 points  What month? 0 points  0 points  What time? 0 points 0 points  Count back from 20 0 points 0 points  Months in reverse 0 points 0 points  Repeat phrase 0 points 0 points  Total Score 0 points 0 points    Immunizations  There is no immunization history on file for this patient.  TDAP status: Due, Education has been  provided regarding the importance of this vaccine. Advised may receive this vaccine at local pharmacy or Health Dept. Aware to provide a copy of the vaccination record if obtained from local pharmacy or Health Dept. Verbalized acceptance and understanding.  Flu Vaccine status: Declined, Education has been provided regarding the importance of this vaccine but patient still declined. Advised may receive this vaccine at local pharmacy or Health Dept. Aware to provide a copy of the vaccination record if obtained from local pharmacy or Health Dept. Verbalized acceptance and understanding.  Pneumococcal vaccine status: Declined,  Education has been provided regarding the importance of this vaccine but patient still declined. Advised may receive this vaccine at local pharmacy or Health Dept. Aware to provide a copy of the vaccination record if obtained from local pharmacy or Health Dept. Verbalized acceptance and understanding.   Covid-19 vaccine status: Information provided on how to obtain vaccines.   Qualifies for Shingles Vaccine? No   Shingrix: Not age appropriate for this patient.   Screening Tests Health Maintenance  Topic Date Due   Pneumococcal Vaccine 1-11 Years old (1 of 2 - PCV) Never done   COVID-19 Vaccine (1 - 2024-25 season) Never done   Colonoscopy  Never done   Medicare Annual Wellness (AWV)  11/05/2023   INFLUENZA VACCINE  01/29/2024 (Originally 06/01/2023)   Hepatitis C Screening  Completed   HIV Screening  Completed   HPV VACCINES  Aged Out   DTaP/Tdap/Td  Discontinued    Health Maintenance  Health Maintenance Due  Topic Date Due   Pneumococcal Vaccine 30-23 Years old (1 of 2 - PCV) Never done   COVID-19 Vaccine (1 - 2024-25 season) Never done   Colonoscopy  Never done   Medicare Annual Wellness (AWV)  11/05/2023    Colorectal cancer screening: Referral to GI placed 11/21/2023. Pt aware the office will call re: appt.  Lung Cancer Screening: (Low Dose CT Chest  recommended if Age 76-80 years, 20 pack-year currently smoking OR have quit w/in 15years.) does qualify.   Lung Cancer Screening Referral: 11/21/2023  Additional Screening:  Hepatitis C Screening: does not qualify; Completed    Vision Screening: Recommended annual ophthalmology exams for early detection of glaucoma and other disorders of the eye. Is the patient up to date with their annual eye exam?  No  Who is the provider or what is the name of the office in which the patient attends annual eye exams? Referral placed If pt is not established with a provider, would they like to be referred to a provider to establish care? Yes .   Dental Screening: Recommended annual dental exams for proper oral hygiene  Diabetic Foot Exam: na  Community Resource Referral / Chronic Care Management: CRR required this visit?  No   CCM required this visit?  No     Plan:     I have personally reviewed and noted the following in the patient's chart:   Medical and social history Use of alcohol, tobacco or illicit drugs  Current medications and supplements including opioid prescriptions. Patient is currently taking opioid prescriptions. Information provided to patient regarding non-opioid alternatives. Patient  advised to discuss non-opioid treatment plan with their provider. Functional ability and status Nutritional status Physical activity Advanced directives List of other physicians Hospitalizations, surgeries, and ER visits in previous 12 months Vitals Screenings to include cognitive, depression, and falls Referrals and appointments  In addition, I have reviewed and discussed with patient certain preventive protocols, quality metrics, and best practice recommendations. A written personalized care plan for preventive services as well as general preventive health recommendations were provided to patient.     Aaron Spence, Spence   11/21/2023   After Visit Summary: (Mail) Due to this being a  telephonic visit, the after visit summary with patients personalized plan was offered to patient via mail   Nurse Notes: see routing comment

## 2023-11-21 NOTE — Patient Instructions (Signed)
Aaron Spence , Thank you for taking time to come for your Medicare Wellness Visit. I appreciate your ongoing commitment to your health goals. Please review the following plan we discussed and let me know if I can assist you in the future.   Referrals/Orders/Follow-Ups/Clinician Recommendations:   Next Medicare Annual Wellness Visit:  November 26, 2024 at 11:20 am virtual visit  You have been referred to have a low dose lung cancer screening test. If you haven't heard from them in a week please call one of the numbers listed below.   [name] [address] [place] [address] [name] Phone: [phone] [name] Phone: [phone]   You have been referred to [facility].If you haven't heard from them within the next week, please call them to schedule your appointment.    Address: [address] [place], [place] [address] Phone: [phone]    [x]  You have been referred to [facility] in [place] for an eye exam. If you have not heard from the office in 5-7 business days, please call the number below to schedule your first appointment.  [facility] [address] [place], [place] [address] Phone: [phone]   This is a list of the screening recommended for you and due dates:  Health Maintenance  Topic Date Due   Pneumococcal Vaccination (1 of 2 - PCV) Never done   COVID-19 Vaccine (1 - 2024-25 season) Never done   Colon Cancer Screening  Never done   Flu Shot  01/29/2024*   Medicare Annual Wellness Visit  11/20/2024   Hepatitis C Screening  Completed   HIV Screening  Completed   HPV Vaccine  Aged Out   DTaP/Tdap/Td vaccine  Discontinued  *Topic was postponed. The date shown is not the original due date.    Advanced directives: (Declined) Advance directive discussed with you today. Even though you declined this today, please call our office should you change your mind, and we can give you the proper paperwork for you to fill out.  Next Medicare Annual  Wellness Visit scheduled for next year: yes  Preventive Care 56-93 Years Old, Male Preventive care refers to lifestyle choices and visits with your health care provider that can promote health and wellness. Preventive care visits are also called wellness exams. What can I expect for my preventive care visit? Counseling During your preventive care visit, your health care provider may ask about your: Medical history, including: Past medical problems. Family medical history. Current health, including: Emotional well-being. Home life and relationship well-being. Sexual activity. Lifestyle, including: Alcohol, nicotine or tobacco, and drug use. Access to firearms. Diet, exercise, and sleep habits. Safety issues such as seatbelt and bike helmet use. Sunscreen use. Work and work Astronomer. Physical exam Your health care provider will check your: Height and weight. These may be used to calculate your BMI (body mass index). BMI is a measurement that tells if you are at a healthy weight. Waist circumference. This measures the distance around your waistline. This measurement also tells if you are at a healthy weight and may help predict your risk of certain diseases, such as type 2 diabetes and high blood pressure. Heart rate and blood pressure. Body temperature. Skin for abnormal spots. What immunizations do I need?  Vaccines are usually given at various ages, according to a schedule. Your health care provider will recommend vaccines for you based on your age, medical history, and lifestyle or other factors, such as travel or where you work. What tests do  I need? Screening Your health care provider may recommend screening tests for certain conditions. This may include: Lipid and cholesterol levels. Diabetes screening. This is done by checking your blood sugar (glucose) after you have not eaten for a while (fasting). Hepatitis B test. Hepatitis C test. HIV (human immunodeficiency virus)  test. STI (sexually transmitted infection) testing, if you are at risk. Lung cancer screening. Prostate cancer screening. Colorectal cancer screening. Talk with your health care provider about your test results, treatment options, and if necessary, the need for more tests. Follow these instructions at home: Eating and drinking  Eat a diet that includes fresh fruits and vegetables, whole grains, lean protein, and low-fat dairy products. Take vitamin and mineral supplements as recommended by your health care provider. Do not drink alcohol if your health care provider tells you not to drink. If you drink alcohol: Limit how much you have to 0-2 drinks a day. Know how much alcohol is in your drink. In the [place] one drink equals one 12 oz bottle of beer (355 mL), one 5 oz glass of wine (148 mL), or one 1 oz glass of hard liquor (44 mL). Lifestyle Brush your teeth every morning and night with fluoride toothpaste. Floss one time each day. Exercise for at least 30 minutes 5 or more days each week. Do not use any products that contain nicotine or tobacco. These products include cigarettes, chewing tobacco, and vaping devices, such as e-cigarettes. If you need help quitting, ask your health care provider. Do not use drugs. If you are sexually active, practice safe sex. Use a condom or other form of protection to prevent STIs. Take aspirin only as told by your health care provider. Make sure that you understand how much to take and what form to take. Work with your health care provider to find out whether it is safe and beneficial for you to take aspirin daily. Find healthy ways to manage stress, such as: Meditation, yoga, or listening to music. Journaling. Talking to a trusted person. Spending time with friends and family. Minimize exposure to UV radiation to reduce your risk of skin cancer. Safety Always wear your seat belt while driving or riding in a vehicle. Do not drive: If you have been  drinking alcohol. Do not ride with someone who has been drinking. When you are tired or distracted. While texting. If you have been using any mind-altering substances or drugs. Wear a helmet and other protective equipment during sports activities. If you have firearms in your house, make sure you follow all gun safety procedures. What's next? Go to your health care provider once a year for an annual wellness visit. Ask your health care provider how often you should have your eyes and teeth checked. Stay up to date on all vaccines. This information is not intended to replace advice given to you by your health care provider. Make sure you discuss any questions you have with your health care provider. Document Revised: 04/14/2021 Document Reviewed: 04/14/2021 Elsevier Patient Education  2024 [employer]. Understanding Your Risk for Falls Millions of people have serious injuries from falls each year. It is important to understand your risk of falling. Talk with your health care provider about your risk and what you can do to lower it. If you do have a serious fall, make sure to tell your provider. Falling once raises your risk of falling again. How can falls affect me? Serious injuries from falls are common. These include: Broken bones, such as hip fractures.  Head injuries, such as traumatic brain injuries (TBI) or concussions. A fear of falling can cause you to avoid activities and stay at home. This can make your muscles weaker and raise your risk for a fall. What can increase my risk? There are a number of risk factors that increase your risk for falling. The more risk factors you have, the higher your risk of falling. Serious injuries from a fall happen most often to people who are older than 46 years old. Teenagers and young adults ages 49-29 are also at higher risk. Common risk factors include: Weakness in the lower body. Being generally weak or confused due to long-term (chronic)  illness. Dizziness or balance problems. Poor vision. Medicines that cause dizziness or drowsiness. These may include: Medicines for your blood pressure, heart, anxiety, insomnia, or swelling (edema). Pain medicines. Muscle relaxants. Other risk factors include: Drinking alcohol. Having had a fall in the past. Having foot pain or wearing improper footwear. Working at a dangerous job. Having any of the following in your home: Tripping hazards, such as floor clutter or loose rugs. Poor lighting. Pets. Having dementia or memory loss. What actions can I take to lower my risk of falling?     Physical activity Stay physically fit. Do strength and balance exercises. Consider taking a regular class to build strength and balance. Yoga and tai chi are good options. Vision Have your eyes checked every year and your prescription for glasses or contacts updated as needed. Shoes and walking aids Wear non-skid shoes. Wear shoes that have rubber soles and low heels. Do not wear high heels. Do not walk around the house in socks or slippers. Use a cane or walker as told by your provider. Home safety Attach secure railings on both sides of your stairs. Install grab bars for your bathtub, shower, and toilet. Use a non-skid mat in your bathtub or shower. Attach bath mats securely with double-sided, non-slip rug tape. Use good lighting in all rooms. Keep a flashlight near your bed. Make sure there is a clear path from your bed to the bathroom. Use night-lights. Do not use throw rugs. Make sure all carpeting is taped or tacked down securely. Remove all clutter from walkways and stairways, including extension cords. Repair uneven or broken steps and floors. Avoid walking on icy or slippery surfaces. Walk on the grass instead of on icy or slick sidewalks. Use ice melter to get rid of ice on walkways in the winter. Use a cordless phone. Questions to ask your health care provider Can you help me check  my risk for a fall? Do any of my medicines make me more likely to fall? Should I take a vitamin D supplement? What exercises can I do to improve my strength and balance? Should I make an appointment to have my vision checked? Do I need a bone density test to check for weak bones (osteoporosis)? Would it help to use a cane or a walker? Where to find more information Centers for Disease Control and Prevention, STEADI: TonerPromos.no Community-Based Fall Prevention Programs: TonerPromos.no [employer] on Aging: BaseRingTones.pl Contact a health care provider if: You fall at home. You are afraid of falling at home. You feel weak, drowsy, or dizzy. This information is not intended to replace advice given to you by your health care provider. Make sure you discuss any questions you have with your health care provider. Document Revised: 06/20/2022 Document Reviewed: 06/20/2022 Elsevier Patient Education  2024 Elsevier Inc.   Managing Pain Without  Opioids Opioids are strong medicines used to treat moderate to severe pain. For some people, especially those who have long-term (chronic) pain, opioids may not be the best choice for pain management due to: Side effects like nausea, constipation, and sleepiness. The risk of addiction (opioid use disorder). The longer you take opioids, the greater your risk of addiction. Pain that lasts for more than 3 months is called chronic pain. Managing chronic pain usually requires more than one approach and is often provided by a team of health care providers working together (multidisciplinary approach). Pain management may be done at a pain management center or pain clinic. How to manage pain without the use of opioids Use non-opioid medicines Non-opioid medicines for pain may include: Over-the-counter or prescription non-steroidal anti-inflammatory drugs (NSAIDs). These may be the first medicines used for pain. They work well for muscle and bone pain, and they reduce  swelling. Acetaminophen. This over-the-counter medicine may work well for milder pain but not swelling. Antidepressants. These may be used to treat chronic pain. A certain type of antidepressant (tricyclics) is often used. These medicines are given in lower doses for pain than when used for depression. Anticonvulsants. These are usually used to treat seizures but may also reduce nerve (neuropathic) pain. Muscle relaxants. These relieve pain caused by sudden muscle tightening (spasms). You may also use a pain medicine that is applied to the skin as a patch, cream, or gel (topical analgesic), such as a numbing medicine. These may cause fewer side effects than medicines taken by mouth. Do certain therapies as directed Some therapies can help with pain management. They include: Physical therapy. You will do exercises to gain strength and flexibility. A physical therapist may teach you exercises to move and stretch parts of your body that are weak, stiff, or painful. You can learn these exercises at physical therapy visits and practice them at home. Physical therapy may also involve: Massage. Heat wraps or applying heat or cold to affected areas. Electrical signals that interrupt pain signals (transcutaneous electrical nerve stimulation, TENS). Weak lasers that reduce pain and swelling (low-level laser therapy). Signals from your body that help you learn to regulate pain (biofeedback). Occupational therapy. This helps you to learn ways to function at home and work with less pain. Recreational therapy. This involves trying new activities or hobbies, such as a physical activity or drawing. Mental health therapy, including: Cognitive behavioral therapy (CBT). This helps you learn coping skills for dealing with pain. Acceptance and commitment therapy (ACT) to change the way you think and react to pain. Relaxation therapies, including muscle relaxation exercises and mindfulness-based stress reduction. Pain  management counseling. This may be individual, family, or group counseling.  Receive medical treatments Medical treatments for pain management include: Nerve block injections. These may include a pain blocker and anti-inflammatory medicines. You may have injections: Near the spine to relieve chronic back or neck pain. Into joints to relieve back or joint pain. Into nerve areas that supply a painful area to relieve body pain. Into muscles (trigger point injections) to relieve some painful muscle conditions. A medical device placed near your spine to help block pain signals and relieve nerve pain or chronic back pain (spinal cord stimulation device). Acupuncture. Follow these instructions at home Medicines Take over-the-counter and prescription medicines only as told by your health care provider. If you are taking pain medicine, ask your health care providers about possible side effects to watch out for. Do not drive or use heavy machinery while taking prescription  opioid pain medicine. Lifestyle  Do not use drugs or alcohol to reduce pain. If you drink alcohol, limit how much you have to: 0-1 drink a day for women who are not pregnant. 0-2 drinks a day for men. Know how much alcohol is in a drink. In the [place] one drink equals one 12 oz bottle of beer (355 mL), one 5 oz glass of wine (148 mL), or one 1 oz glass of hard liquor (44 mL). Do not use any products that contain nicotine or tobacco. These products include cigarettes, chewing tobacco, and vaping devices, such as e-cigarettes. If you need help quitting, ask your health care provider. Eat a healthy diet and maintain a healthy weight. Poor diet and excess weight may make pain worse. Eat foods that are high in fiber. These include fresh fruits and vegetables, whole grains, and beans. Limit foods that are high in fat and processed sugars, such as fried and sweet foods. Exercise regularly. Exercise lowers stress and may help relieve  pain. Ask your health care provider what activities and exercises are safe for you. If your health care provider approves, join an exercise class that combines movement and stress reduction. Examples include yoga and tai chi. Get enough sleep. Lack of sleep may make pain worse. Lower stress as much as possible. Practice stress reduction techniques as told by your therapist. General instructions Work with all your pain management providers to find the treatments that work best for you. You are an important member of your pain management team. There are many things you can do to reduce pain on your own. Consider joining an online or in-person support group for people who have chronic pain. Keep all follow-up visits. This is important. Where to find more information You can find more information about managing pain without opioids from: American Academy of Pain Medicine: painmed.org Institute for Chronic Pain: instituteforchronicpain.org American Chronic Pain Association: theacpa.org Contact a health care provider if: You have side effects from pain medicine. Your pain gets worse or does not get better with treatments or home therapy. You are struggling with anxiety or depression. Summary Many types of pain can be managed without opioids. Chronic pain may respond better to pain management without opioids. Pain is best managed when you and a team of health care providers work together. Pain management without opioids may include non-opioid medicines, medical treatments, physical therapy, mental health therapy, and lifestyle changes. Tell your health care providers if your pain gets worse or is not being managed well enough. This information is not intended to replace advice given to you by your health care provider. Make sure you discuss any questions you have with your health care provider. Document Revised: 01/27/2021 Document Reviewed: 01/27/2021 Elsevier Patient Education  2024 [employer].

## 2023-11-30 ENCOUNTER — Ambulatory Visit: Payer: 59 | Admitting: Orthopaedic Surgery

## 2023-12-07 ENCOUNTER — Ambulatory Visit: Payer: 59 | Admitting: Orthopaedic Surgery

## 2024-01-11 ENCOUNTER — Ambulatory Visit: Admitting: Orthopaedic Surgery

## 2024-01-11 ENCOUNTER — Encounter: Payer: Self-pay | Admitting: Orthopaedic Surgery

## 2024-01-11 VITALS — BP 116/79 | HR 84 | Ht 66.0 in | Wt 132.0 lb

## 2024-01-11 DIAGNOSIS — M25511 Pain in right shoulder: Secondary | ICD-10-CM

## 2024-01-11 DIAGNOSIS — G8929 Other chronic pain: Secondary | ICD-10-CM | POA: Diagnosis not present

## 2024-01-11 MED ORDER — HYDROCODONE-ACETAMINOPHEN 5-325 MG PO TABS: ORAL_TABLET | ORAL | 0 refills | Status: DC

## 2024-01-11 NOTE — Progress Notes (Signed)
 My shoulder hurts now and then, more over the last week.  His right shoulder is tender.  He has no new trauma.  He is active and working at [employer] now.  ROM of the right shoulder is full but pain in the extremes.  NV intact.  ROM of neck is full.  Encounter Diagnosis  Name Primary?   Chronic right shoulder pain Yes   I have reviewed the [place] Controlled Substance Reporting System web site prior to prescribing narcotic medicine for this patient.  Continue exercises.  Return in three months.  Call if any problem.  Precautions discussed.  Electronically Signed Darreld Mclean, MD 3/13/20259:12 AM

## 2024-01-21 ENCOUNTER — Encounter (HOSPITAL_COMMUNITY): Payer: Self-pay

## 2024-01-21 ENCOUNTER — Emergency Department (HOSPITAL_COMMUNITY)
Admission: EM | Admit: 2024-01-21 | Discharge: 2024-01-21 | Disposition: A | Discharge status: Home / Self Care | Attending: Emergency Medicine | Admitting: Emergency Medicine

## 2024-01-21 ENCOUNTER — Other Ambulatory Visit: Payer: Self-pay

## 2024-01-21 DIAGNOSIS — R0981 Nasal congestion: Secondary | ICD-10-CM | POA: Diagnosis present

## 2024-01-21 DIAGNOSIS — J302 Other seasonal allergic rhinitis: Secondary | ICD-10-CM | POA: Diagnosis not present

## 2024-01-21 MED ORDER — FLUTICASONE PROPIONATE 50 MCG/ACT NA SUSP: 2.0000 | Freq: Every day | NASAL | 2 refills | Status: DC

## 2024-01-21 MED ORDER — CETIRIZINE HCL 10 MG PO TABS: 10.0000 mg | ORAL_TABLET | Freq: Every day | ORAL | 0 refills | Status: DC

## 2024-01-21 NOTE — ED Provider Notes (Signed)
 [facility] AT [facility] Provider Note   CSN: [id number] Arrival date & time: 01/21/24  1704     History  Chief Complaint  Patient presents with   sinus congestion    Aaron Spence is a 48 y.o. male.  HPI      HELAMAN Spence is a 48 y.o. male who presents to the Emergency Department complaining of sinus congestion, rhinorrhea sneezing.  Symptoms began 2 days ago.  Denies any fever, cough, ear pain or sore throat.  No headache or dizziness.  States he has similar symptoms at different times of the year.  No recent sick contacts.  No chest pain or shortness of breath.   Home Medications Prior to Admission medications   Medication Sig Start Date End Date Taking? Authorizing Provider  albuterol (VENTOLIN HFA) 108 (90 Base) MCG/ACT inhaler Inhale 1 puff into the lungs every 6 (six) hours as needed for wheezing or shortness of breath. 09/21/23   Billie Lade, MD  diphenhydrAMINE (BENADRYL) 25 MG tablet Take 1 tablet (25 mg total) by mouth every 6 (six) hours as needed. Medication may cause drowsiness 09/29/22   Darreld Mclean, MD  doxycycline (VIBRAMYCIN) 100 MG capsule Take 1 capsule (100 mg total) by mouth 2 (two) times daily. 11/13/23   Gilda Crease, MD  fluticasone (FLONASE) 50 MCG/ACT nasal spray Place 2 sprays into both nostrils daily. 04/23/23   Carmel Sacramento A, PA-C  HYDROcodone-acetaminophen (NORCO/VICODIN) 5-325 MG tablet One tablet every six hours for pain.  Limit 7 days. 01/11/24   Darreld Mclean, MD  loratadine (CLARITIN) 5 MG chewable tablet Chew 1 tablet (5 mg total) by mouth daily. 10/09/23   Sabas Sous, MD  meloxicam (MOBIC) 15 MG tablet Take 1 tablet (15 mg total) by mouth daily. 12/29/22   Gilda Crease, MD  methocarbamol (ROBAXIN) 500 MG tablet Take 1 tablet (500 mg total) by mouth every 8 (eight) hours as needed for muscle spasms. 12/29/22   Gilda Crease, MD  nicotine polacrilex (NICORETTE STARTER  KIT) 4 MG gum Take 1 each (4 mg total) by mouth as needed for smoking cessation. 11/03/22   Billie Lade, MD  predniSONE (DELTASONE) 20 MG tablet 2 tabs po daily x 4 days 05/07/23   Mesner, Barbara Cower, MD  Vitamin D, Ergocalciferol, (DRISDOL) 1.25 MG (50000 UNIT) CAPS capsule Take 1 capsule (50,000 Units total) by mouth every 7 (seven) days. 09/22/23   Billie Lade, MD      Allergies    Patient has no known allergies.    Review of Systems   Review of Systems  Constitutional:  Negative for appetite change, chills and fever.  HENT:  Positive for congestion, rhinorrhea and sneezing. Negative for trouble swallowing.   Respiratory:  Negative for cough and shortness of breath.   Gastrointestinal:  Negative for abdominal pain, diarrhea, nausea and vomiting.  Genitourinary:  Negative for dysuria.  Musculoskeletal:  Negative for arthralgias, myalgias, neck pain and neck stiffness.  Skin:  Negative for rash.  Neurological:  Negative for dizziness, facial asymmetry, weakness, numbness and headaches.    Physical Exam Updated Vital Signs BP 120/76 (BP Location: Right Arm)   Pulse (!) 105   Temp 98.6 F (37 C) (Oral)   Resp 14   Ht 5\' 6"  (1.676 m)   Wt 59.9 kg   SpO2 96%   BMI 21.31 kg/m  Physical Exam Vitals and nursing note reviewed.  Constitutional:  General: He is not in acute distress.    Appearance: Normal appearance. He is not ill-appearing or toxic-appearing.  HENT:     Right Ear: Tympanic membrane and ear canal normal.     Left Ear: Tympanic membrane and ear canal normal.     Ears:     Comments: Cerumen bilateral ear canals    Mouth/Throat:     Mouth: Mucous membranes are moist.     Pharynx: Oropharynx is clear. No oropharyngeal exudate or posterior oropharyngeal erythema.  Eyes:     Conjunctiva/sclera: Conjunctivae normal.  Cardiovascular:     Rate and Rhythm: Regular rhythm.     Pulses: Normal pulses.  Pulmonary:     Effort: Pulmonary effort is normal.   Musculoskeletal:        General: Normal range of motion.     Cervical back: Normal range of motion. No rigidity.  Lymphadenopathy:     Cervical: No cervical adenopathy.  Skin:    General: Skin is warm.     Capillary Refill: Capillary refill takes less than 2 seconds.  Neurological:     General: No focal deficit present.     Mental Status: He is alert.     Sensory: No sensory deficit.     Motor: No weakness.     ED Results / Procedures / Treatments   Labs (all labs ordered are listed, but only abnormal results are displayed) Labs Reviewed - No data to display  EKG None  Radiology No results found.  Procedures Procedures    Medications Ordered in ED Medications - No data to display  ED Course/ Medical Decision Making/ A&P                                 Medical Decision Making Patient here with 2-day history of URI type symptoms.  Sneezing, rhinorrhea nasal congestion.  No fever or chills.  Denies any chest pain or shortness of breath neck pain headache or dizziness.  Has similar symptoms several times a year.  I suspect this is seasonal allergies.  Low clinical suspicion for emergent process.  Bacterial versus viral process also considered  Amount and/or Complexity of Data Reviewed Discussion of management or test interpretation with external provider(s): Patient well-appearing.  No indication for emergent process.  Agreeable to symptomatic treatment with nasal spray and Zyrtec for symptom relief.  Will follow-up outpatient with PCP if needed.           Final Clinical Impression(s) / ED Diagnoses Final diagnoses:  Seasonal allergic rhinitis, unspecified trigger    Rx / DC Orders ED Discharge Orders     None         Rosey Bath 01/21/24 1832    Jacalyn Lefevre, MD 01/21/24 2334

## 2024-01-21 NOTE — Discharge Instructions (Signed)
 Use medication as directed.  Please follow-up with your primary care provider for recheck if needed.

## 2024-01-21 NOTE — ED Triage Notes (Signed)
 Pt reports sinus congestion since Friday, denies any cough or sore throat.

## 2024-02-22 ENCOUNTER — Telehealth: Payer: Self-pay | Admitting: Orthopaedic Surgery

## 2024-02-22 MED ORDER — HYDROCODONE-ACETAMINOPHEN 5-325 MG PO TABS: ORAL_TABLET | ORAL | 0 refills | Status: DC

## 2024-02-22 NOTE — Telephone Encounter (Signed)
Dr. Sanjuan Dame pt - pt lvm requesting a refill for Hydrocodone 5-325 to be sent to [facility] on [address]

## 2024-03-20 ENCOUNTER — Ambulatory Visit: Payer: 59 | Admitting: Internal Medicine

## 2024-03-27 ENCOUNTER — Other Ambulatory Visit: Payer: Self-pay

## 2024-03-27 ENCOUNTER — Encounter: Payer: Self-pay | Admitting: Internal Medicine

## 2024-03-27 ENCOUNTER — Emergency Department (HOSPITAL_COMMUNITY)
Admission: EM | Admit: 2024-03-27 | Discharge: 2024-03-28 | Disposition: A | Discharge status: Home / Self Care | Attending: Emergency Medicine | Admitting: Emergency Medicine

## 2024-03-27 DIAGNOSIS — R519 Headache, unspecified: Secondary | ICD-10-CM | POA: Diagnosis present

## 2024-03-27 DIAGNOSIS — J069 Acute upper respiratory infection, unspecified: Secondary | ICD-10-CM | POA: Diagnosis not present

## 2024-03-27 MED ORDER — PREDNISONE 50 MG PO TABS
60.0000 mg | ORAL_TABLET | Freq: Once | ORAL | Status: AC
  Administered 2024-03-27: 60 mg via ORAL
  Filled 2024-03-27: qty 1

## 2024-03-27 MED ORDER — PREDNISONE 20 MG PO TABS: 40.0000 mg | ORAL_TABLET | Freq: Every day | ORAL | 0 refills | Status: DC

## 2024-03-27 MED ORDER — CLARITIN-D 24 HOUR 10-240 MG PO TB24: 1.0000 | ORAL_TABLET | Freq: Every day | ORAL | 0 refills | Status: AC

## 2024-03-27 MED ORDER — FLUTICASONE PROPIONATE 50 MCG/ACT NA SUSP: 2.0000 | Freq: Every day | NASAL | 2 refills | Status: DC

## 2024-03-27 NOTE — ED Provider Notes (Signed)
 [place] EMERGENCY DEPARTMENT AT [facility] Provider Note   CSN: [id number] Arrival date & time: 03/27/24  2327     History  No chief complaint on file.   Aaron Spence is a 48 y.o. male.  Presents to the emergency department with sinus congestion, headache, sneezing.  Symptoms ongoing for 3 days.  No cough or fever.  He has tried to take Zyrtec  without much benefit, reports that he has run out.       Home Medications Prior to Admission medications   Medication Sig Start Date End Date Taking? Authorizing Provider  loratadine -pseudoephedrine (CLARITIN -D 24 HOUR) 10-240 MG 24 hr tablet Take 1 tablet by mouth daily. 03/27/24  Yes Cormac Wint, Marine Sia, MD  predniSONE  (DELTASONE ) 20 MG tablet Take 2 tablets (40 mg total) by mouth daily with breakfast. 03/27/24  Yes Treyvon Blahut, Marine Sia, MD  albuterol  (VENTOLIN  HFA) 108 (90 Base) MCG/ACT inhaler Inhale 1 puff into the lungs every 6 (six) hours as needed for wheezing or shortness of breath. 09/21/23   Tobi Fortes, MD  cetirizine  (ZYRTEC  ALLERGY) 10 MG tablet Take 1 tablet (10 mg total) by mouth daily. 01/21/24   Triplett, Tammy, PA-C  doxycycline  (VIBRAMYCIN ) 100 MG capsule Take 1 capsule (100 mg total) by mouth 2 (two) times daily. 11/13/23   Ballard Bongo, MD  fluticasone  (FLONASE ) 50 MCG/ACT nasal spray Place 2 sprays into both nostrils daily. 03/27/24   Ballard Bongo, MD  HYDROcodone -acetaminophen  (NORCO/VICODIN) 5-325 MG tablet One tablet every six hours for pain.  Limit 7 days. 02/22/24   Pleasant Brilliant, MD  meloxicam  (MOBIC ) 15 MG tablet Take 1 tablet (15 mg total) by mouth daily. 12/29/22   Ballard Bongo, MD  methocarbamol  (ROBAXIN ) 500 MG tablet Take 1 tablet (500 mg total) by mouth every 8 (eight) hours as needed for muscle spasms. 12/29/22   Ballard Bongo, MD  nicotine  polacrilex (NICORETTE  STARTER KIT) 4 MG gum Take 1 each (4 mg total) by mouth as needed for smoking cessation.  11/03/22   Tobi Fortes, MD  Vitamin D , Ergocalciferol , (DRISDOL ) 1.25 MG (50000 UNIT) CAPS capsule Take 1 capsule (50,000 Units total) by mouth every 7 (seven) days. 09/22/23   Tobi Fortes, MD      Allergies    Patient has no known allergies.    Review of Systems   Review of Systems  Physical Exam Updated Vital Signs BP 114/76   Pulse 92   Temp 97.9 F (36.6 C) (Oral)   Resp 17   SpO2 96%  Physical Exam Vitals and nursing note reviewed.  Constitutional:      General: He is not in acute distress.    Appearance: He is well-developed.  HENT:     Head: Normocephalic and atraumatic.     Nose: Congestion present.     Right Sinus: Maxillary sinus tenderness present.     Left Sinus: Maxillary sinus tenderness present.     Mouth/Throat:     Mouth: Mucous membranes are moist.  Eyes:     General: Vision grossly intact. Gaze aligned appropriately.     Extraocular Movements: Extraocular movements intact.     Conjunctiva/sclera: Conjunctivae normal.  Cardiovascular:     Rate and Rhythm: Normal rate and regular rhythm.     Pulses: Normal pulses.     Heart sounds: Normal heart sounds, S1 normal and S2 normal. No murmur heard.    No friction rub. No gallop.  Pulmonary:  Effort: Pulmonary effort is normal. No respiratory distress.     Breath sounds: Normal breath sounds.  Abdominal:     Palpations: Abdomen is soft.     Tenderness: There is no abdominal tenderness. There is no guarding or rebound.     Hernia: No hernia is present.  Musculoskeletal:        General: No swelling.     Cervical back: Full passive range of motion without pain, normal range of motion and neck supple. No pain with movement, spinous process tenderness or muscular tenderness. Normal range of motion.     Right lower leg: No edema.     Left lower leg: No edema.  Skin:    General: Skin is warm and dry.     Capillary Refill: Capillary refill takes less than 2 seconds.     Findings: No ecchymosis,  erythema, lesion or wound.  Neurological:     Mental Status: He is alert and oriented to person, place, and time.     GCS: GCS eye subscore is 4. GCS verbal subscore is 5. GCS motor subscore is 6.     Cranial Nerves: Cranial nerves 2-12 are intact.     Sensory: Sensation is intact.     Motor: Motor function is intact. No weakness or abnormal muscle tone.     Coordination: Coordination is intact.  Psychiatric:        Mood and Affect: Mood normal.        Speech: Speech normal.        Behavior: Behavior normal.     ED Results / Procedures / Treatments   Labs (all labs ordered are listed, but only abnormal results are displayed) Labs Reviewed - No data to display  EKG None  Radiology No results found.  Procedures Procedures    Medications Ordered in ED Medications  predniSONE  (DELTASONE ) tablet 60 mg (has no administration in time range)    ED Course/ Medical Decision Making/ A&P                                 Medical Decision Making  Differential Diagnosis considered includes, but not limited to: COVID-19; influenza; RSV; simple viral URI; strep pharyngitis; pneumonia  Presents with sinus congestion.  Examination is unremarkable.  Vitals are normal.  Lungs clear to auscultation, no concern for pneumonia.  Treat symptomatically.        Final Clinical Impression(s) / ED Diagnoses Final diagnoses:  Upper respiratory tract infection, unspecified type    Rx / DC Orders ED Discharge Orders          Ordered    loratadine -pseudoephedrine (CLARITIN -D 24 HOUR) 10-240 MG 24 hr tablet  Daily        03/27/24 2343    predniSONE  (DELTASONE ) 20 MG tablet  Daily with breakfast        03/27/24 2343    fluticasone  (FLONASE ) 50 MCG/ACT nasal spray  Daily        03/27/24 2343              Ballard Bongo, MD 03/27/24 [phone]

## 2024-03-27 NOTE — ED Triage Notes (Signed)
 Pt states he thinks he has a sinus infection, reports he has felt congested and has been sneezing a lot since Sunday

## 2024-04-10 ENCOUNTER — Ambulatory Visit: Admitting: Orthopaedic Surgery

## 2024-04-16 ENCOUNTER — Ambulatory Visit

## 2024-04-18 ENCOUNTER — Ambulatory Visit: Admitting: Internal Medicine

## 2024-04-23 ENCOUNTER — Encounter: Payer: Self-pay | Admitting: Internal Medicine

## 2024-04-23 ENCOUNTER — Other Ambulatory Visit: Payer: Self-pay | Admitting: Orthopaedic Surgery

## 2024-04-23 NOTE — Telephone Encounter (Signed)
 Dr. Janae pt - pt lvm requesting a refill for Hydrocodone  5-325, 20 tablets, one tablet every six hours for pain.  Limit 7 days

## 2024-04-24 MED ORDER — HYDROCODONE-ACETAMINOPHEN 5-325 MG PO TABS: ORAL_TABLET | ORAL | 0 refills | Status: DC

## 2024-04-24 NOTE — Telephone Encounter (Signed)
 To Dr Margrette Dr Brenna out of the office.

## 2024-05-01 ENCOUNTER — Encounter: Payer: Self-pay | Admitting: Orthopaedic Surgery

## 2024-05-01 ENCOUNTER — Ambulatory Visit (INDEPENDENT_AMBULATORY_CARE_PROVIDER_SITE_OTHER): Admitting: Orthopaedic Surgery

## 2024-05-01 VITALS — BP 107/71 | HR 67

## 2024-05-01 DIAGNOSIS — F17210 Nicotine dependence, cigarettes, uncomplicated: Secondary | ICD-10-CM | POA: Diagnosis not present

## 2024-05-01 DIAGNOSIS — M25511 Pain in right shoulder: Secondary | ICD-10-CM

## 2024-05-01 DIAGNOSIS — G8929 Other chronic pain: Secondary | ICD-10-CM

## 2024-05-01 MED ORDER — HYDROCODONE-ACETAMINOPHEN 5-325 MG PO TABS: ORAL_TABLET | ORAL | 0 refills | Status: DC

## 2024-05-01 NOTE — Progress Notes (Signed)
 My shoulder is hurting again.  He has pain in the right shoulder again.  He has no trauma.  He has pain with overhead use. He has no swelling or redness.  Examination of right Upper Extremity is done.  Inspection:   Overall:  Elbow non-tender without crepitus or defects, forearm non-tender without crepitus or defects, wrist non-tender without crepitus or defects, hand non-tender.    Shoulder: with glenohumeral joint tenderness, without effusion.   Upper arm:  without swelling and tenderness   Range of motion:   Overall:  Full range of motion of the elbow, full range of motion of wrist and full range of motion in fingers.   Shoulder:  right  160 degrees forward flexion; 150 degrees abduction; 30 degrees internal rotation, 30 degrees external rotation, 15 degrees extension, 40 degrees adduction.   Stability:   Overall:  Shoulder, elbow and wrist stable   Strength and Tone:   Overall full shoulder muscles strength, full upper arm strength and normal upper arm bulk and tone.   Encounter Diagnoses  Name Primary?   Chronic right shoulder pain Yes   Cigarette nicotine  dependence without complication    I have reviewed the [place]  Controlled Substance Reporting System web site prior to prescribing narcotic medicine for this patient.  Return in three months.  Continue exercises.  Call if any problem.  Precautions discussed.  Electronically Signed Lemond Stable, MD 7/2/202510:33 AM

## 2024-05-17 ENCOUNTER — Other Ambulatory Visit: Payer: Self-pay

## 2024-05-17 ENCOUNTER — Encounter (HOSPITAL_COMMUNITY): Payer: Self-pay | Admitting: *Deleted

## 2024-05-17 ENCOUNTER — Emergency Department (HOSPITAL_COMMUNITY)
Admission: EM | Admit: 2024-05-17 | Discharge: 2024-05-17 | Disposition: A | Discharge status: Home / Self Care | Attending: Emergency Medicine | Admitting: Emergency Medicine

## 2024-05-17 DIAGNOSIS — R059 Cough, unspecified: Secondary | ICD-10-CM | POA: Diagnosis present

## 2024-05-17 DIAGNOSIS — J0110 Acute frontal sinusitis, unspecified: Secondary | ICD-10-CM | POA: Insufficient documentation

## 2024-05-17 MED ORDER — AZELASTINE HCL 0.1 % NA SOLN: 1.0000 | Freq: Two times a day (BID) | NASAL | 0 refills | Status: DC

## 2024-05-17 MED ORDER — PREDNISONE 10 MG PO TABS: 40.0000 mg | ORAL_TABLET | Freq: Every day | ORAL | 0 refills | Status: AC

## 2024-05-17 MED ORDER — CETIRIZINE-PSEUDOEPHEDRINE ER 5-120 MG PO TB12: 1.0000 | ORAL_TABLET | Freq: Every day | ORAL | 0 refills | Status: DC

## 2024-05-17 NOTE — Discharge Instructions (Signed)

## 2024-05-17 NOTE — ED Notes (Signed)
 Pt/family received d/c paperwork at this time. After going over the paperwork any questions, comments, or concerns were answered to the best of this nurse's knowledge. The pt/family verbally acknowledged the teachings/instructions.

## 2024-05-17 NOTE — ED Triage Notes (Signed)
 Pt states he has sinus congestion x 2 days. Denies fevers or N/V. States it's clear when able to get some out.

## 2024-05-17 NOTE — ED Provider Notes (Signed)
 [place] EMERGENCY DE[place]MENT AT [facility] Provider Note   CSN: [id number] Arrival date & time: 05/17/24  1641     Patient presents with: Facial Pain   Aaron Spence is a 48 y.o. male.   Patient is a 48 year old male who presents emergency department chief complaint of sinus congestion over the past 2 days.  He has had a mild cough at times.  He denies any associated fever or chills.  He denies any ear pain.  He denies any sore throat.  There is no chest pain, shortness of breath, abdominal pain, nausea, vomiting, diarrhea.  He denies any known sick contacts.        Prior to Admission medications   Medication Sig Start Date End Date Taking? Authorizing Provider  albuterol  (VENTOLIN  HFA) 108 (90 Base) MCG/ACT inhaler Inhale 1 puff into the lungs every 6 (six) hours as needed for wheezing or shortness of breath. 09/21/23   Melvenia Manus BRAVO, MD  cetirizine  (ZYRTEC  ALLERGY) 10 MG tablet Take 1 tablet (10 mg total) by mouth daily. 01/21/24   Triplett, Tammy, PA-C  doxycycline  (VIBRAMYCIN ) 100 MG capsule Take 1 capsule (100 mg total) by mouth 2 (two) times daily. 11/13/23   Haze Lonni PARAS, MD  fluticasone  (FLONASE ) 50 MCG/ACT nasal spray Place 2 sprays into both nostrils daily. 03/27/24   Haze Lonni PARAS, MD  HYDROcodone -acetaminophen  (NORCO/VICODIN) 5-325 MG tablet One tablet every six hours for pain.  Limit 7 days. 05/01/24   Brenna Lin, MD  loratadine -pseudoephedrine (CLARITIN -D 24 HOUR) 10-240 MG 24 hr tablet Take 1 tablet by mouth daily. 03/27/24   Haze Lonni PARAS, MD  meloxicam  (MOBIC ) 15 MG tablet Take 1 tablet (15 mg total) by mouth daily. 12/29/22   Haze Lonni PARAS, MD  methocarbamol  (ROBAXIN ) 500 MG tablet Take 1 tablet (500 mg total) by mouth every 8 (eight) hours as needed for muscle spasms. 12/29/22   Haze Lonni PARAS, MD  nicotine  polacrilex (NICORETTE  STARTER KIT) 4 MG gum Take 1 each (4 mg total) by mouth as needed for smoking  cessation. 11/03/22   Melvenia Manus BRAVO, MD  predniSONE  (DELTASONE ) 20 MG tablet Take 2 tablets (40 mg total) by mouth daily with breakfast. 03/27/24   Pollina, Lonni PARAS, MD  Vitamin D , Ergocalciferol , (DRISDOL ) 1.25 MG (50000 UNIT) CAPS capsule Take 1 capsule (50,000 Units total) by mouth every 7 (seven) days. 09/22/23   Melvenia Manus BRAVO, MD    Allergies: Patient has no known allergies.    Review of Systems  HENT:  Positive for rhinorrhea and sinus pain.   All other systems reviewed and are negative.   Updated Vital Signs BP 138/66 (BP Location: Right Arm)   Pulse 99   Temp 97.6 F (36.4 C)   Resp 17   Ht 5' 6 (1.676 m)   Wt 58.1 kg   SpO2 97%   BMI 20.66 kg/m   Physical Exam Vitals and nursing note reviewed.  Constitutional:      Appearance: Normal appearance.  HENT:     Head: Normocephalic and atraumatic.     Right Ear: Tympanic membrane, ear canal and external ear normal. There is no impacted cerumen.     Left Ear: Tympanic membrane, ear canal and external ear normal. There is no impacted cerumen.     Nose: Congestion and rhinorrhea present.     Mouth/Throat:     Mouth: Mucous membranes are moist.     Pharynx: No oropharyngeal exudate or posterior oropharyngeal erythema.  Eyes:     Extraocular Movements: Extraocular movements intact.     Conjunctiva/sclera: Conjunctivae normal.     Pupils: Pupils are equal, round, and reactive to light.  Cardiovascular:     Rate and Rhythm: Normal rate and regular rhythm.     Pulses: Normal pulses.     Heart sounds: No murmur heard.    Gallop present.  Pulmonary:     Effort: Pulmonary effort is normal. No respiratory distress.     Breath sounds: Normal breath sounds. No stridor. No wheezing, rhonchi or rales.  Musculoskeletal:        General: Normal range of motion.     Cervical back: Normal range of motion and neck supple. No rigidity or tenderness.  Lymphadenopathy:     Cervical: No cervical adenopathy.  Skin:    General:  Skin is warm and dry.  Neurological:     General: No focal deficit present.     Mental Status: He is alert and oriented to person, place, and time. Mental status is at baseline.  Psychiatric:        Mood and Affect: Mood normal.        Behavior: Behavior normal.        Thought Content: Thought content normal.        Judgment: Judgment normal.     (all labs ordered are listed, but only abnormal results are displayed) Labs Reviewed - No data to display  EKG: None  Radiology: No results found.   Procedures   Medications Ordered in the ED - No data to display                                  Medical Decision Making Patient is doing well at this time and is stable for discharge home.  Discussed with patient signs are most consistent with acute viral sinusitis.  He has stable vital signs at this point.  Do not suspect acute bacterial infection at this time.  Will continue symptomatic treatment on outpatient basis.  Do not suspect antibiotics are warranted at this time as symptoms have only been going on for 2 days.  He has no indication for otitis media.  The need for close follow-up primary care doctor was discussed as well as strict turn precautions for any new or worsening symptoms.  Patient voiced understanding and had no additional questions.        Final diagnoses:  None    ED Discharge Orders     None          Daralene Lonni JONETTA DEVONNA 05/17/24 1726    Freddi Hamilton, MD 05/17/24 [phone]

## 2024-05-23 ENCOUNTER — Ambulatory Visit

## 2024-07-03 ENCOUNTER — Ambulatory Visit: Payer: Self-pay

## 2024-07-03 ENCOUNTER — Ambulatory Visit

## 2024-07-03 VITALS — BP 101/62 | HR 101 | Wt 128.0 lb

## 2024-07-03 DIAGNOSIS — H6123 Impacted cerumen, bilateral: Secondary | ICD-10-CM

## 2024-07-03 NOTE — Progress Notes (Signed)
   Established Patient Office Visit  Subjective   Patient ID: Aaron Spence, male    DOB: 04/18/1976  Age: 48 y.o. MRN: [id number]  Chief Complaint  Patient presents with   Medication Management   Ear Fullness    Left ear, has been a problem for a while since he had a sinus infection     HPI Discussed the use of AI scribe software for clinical note transcription with the patient, who gave verbal consent to proceed.  History of Present Illness   Aaron Spence is a 48 year old male who presents with ear pain.  Otalgia - Right ear pain persists despite improvement in sinus infection - No use of earbuds or other devices in ears - Familiar with ear cleaning procedures from prior experience  Sinus symptoms - Recent sinus infection with improvement of symptoms  Associated symptoms - No current headaches      Patient Active Problem List   Diagnosis Date Noted   Colon cancer screening 09/21/2023   Vitamin D  deficiency 12/30/2022   Encounter for general adult medical examination with abnormal findings 11/03/2022   Tobacco use 11/03/2022   Chronic low back pain 11/03/2022   Right shoulder pain 12/08/2015   Pain in joint, shoulder region 05/05/2014   Muscle weakness (generalized) 05/05/2014      ROS    Objective:     BP 101/62 (BP Location: Left Arm, Patient Position: Sitting, Cuff Size: Normal)   Pulse (!) 101   Wt 128 lb 0.6 oz (58.1 kg)   SpO2 95%   BMI 20.67 kg/m  BP Readings from Last 3 Encounters:  07/03/24 101/62  05/17/24 138/66  05/01/24 107/71   Wt Readings from Last 3 Encounters:  07/03/24 128 lb 0.6 oz (58.1 kg)  05/17/24 128 lb (58.1 kg)  01/21/24 132 lb (59.9 kg)      Physical Exam Vitals and nursing note reviewed.  Constitutional:      Appearance: Normal appearance.  HENT:     Head: Normocephalic.  Eyes:     Extraocular Movements: Extraocular movements intact.     Pupils: Pupils are equal, round, and reactive to light.   Cardiovascular:     Rate and Rhythm: Normal rate and regular rhythm.  Pulmonary:     Effort: Pulmonary effort is normal.     Breath sounds: Normal breath sounds.  Musculoskeletal:     Cervical back: Normal range of motion and neck supple.  Neurological:     Mental Status: He is alert and oriented to person, place, and time.  Psychiatric:        Mood and Affect: Mood normal.        Thought Content: Thought content normal.      No results found for any visits on 07/03/24.    The 10-year ASCVD risk score (Arnett DK, et al., 2019) is: 4.2%    Assessment & Plan:   Problem List Items Addressed This Visit   None Visit Diagnoses       Bilateral impacted cerumen    -  Primary   Bilateral impacted cerumen causing ear pain. Cerumen successly removed with irrigaion.  Patient tolerated well and reports improvement of symptoms.             No follow-ups on file.    Aaron Longs, FNP

## 2024-07-31 ENCOUNTER — Ambulatory Visit: Admitting: Orthopaedic Surgery

## 2024-09-03 ENCOUNTER — Emergency Department (HOSPITAL_COMMUNITY)
Admission: EM | Admit: 2024-09-03 | Discharge: 2024-09-04 | Disposition: A | Discharge status: Home / Self Care | Attending: Emergency Medicine | Admitting: Emergency Medicine

## 2024-09-03 ENCOUNTER — Other Ambulatory Visit: Payer: Self-pay

## 2024-09-03 ENCOUNTER — Encounter (HOSPITAL_COMMUNITY): Payer: Self-pay | Admitting: Emergency Medicine

## 2024-09-03 DIAGNOSIS — J069 Acute upper respiratory infection, unspecified: Secondary | ICD-10-CM | POA: Diagnosis not present

## 2024-09-03 DIAGNOSIS — F17210 Nicotine dependence, cigarettes, uncomplicated: Secondary | ICD-10-CM | POA: Diagnosis not present

## 2024-09-03 DIAGNOSIS — R0981 Nasal congestion: Secondary | ICD-10-CM | POA: Diagnosis present

## 2024-09-03 NOTE — ED Triage Notes (Signed)
 Pt c/o sneezing and coughing since yesterday. Pt thinks he might have a sinus cold.

## 2024-09-04 DIAGNOSIS — J069 Acute upper respiratory infection, unspecified: Secondary | ICD-10-CM | POA: Diagnosis not present

## 2024-09-04 LAB — RESP PANEL BY RT-PCR (RSV, FLU A&B, COVID)  RVPGX2
Influenza A by PCR: NEGATIVE
Influenza B by PCR: NEGATIVE
Resp Syncytial Virus by PCR: NEGATIVE
SARS Coronavirus 2 by RT PCR: NEGATIVE

## 2024-09-04 MED ORDER — NAPROXEN 500 MG PO TABS: 500.0000 mg | ORAL_TABLET | Freq: Two times a day (BID) | ORAL | 0 refills | Status: DC

## 2024-09-04 MED ORDER — FLUTICASONE PROPIONATE 50 MCG/ACT NA SUSP: 2.0000 | Freq: Every day | NASAL | 0 refills | Status: AC

## 2024-09-04 NOTE — Discharge Instructions (Signed)
 You were evaluated in the Emergency Department and after careful evaluation, we did not find any emergent condition requiring admission or further testing in the hospital.  Your exam/testing today is overall reassuring.  Symptoms likely due to an upper respiratory infection.  Use the Flonase  as needed.  Use Naprosyn  twice daily for discomfort.  Follow-up with your primary care doctor.  Please return to the Emergency Department if you experience any worsening of your condition.   Thank you for allowing us  to be a part of your care.

## 2024-09-04 NOTE — ED Provider Notes (Signed)
 [facility]-EMERGENCY DEPT [facility] Emergency Department Provider Note MRN:  [id number]  Arrival date & time: 09/04/24     Chief Complaint   URI   History of Present Illness   Aaron Spence is a 48 y.o. year-old male with no pertinent past medical history presenting to the ED with chief complaint of URI.  Frequent sneezing and nasal congestion for the past few days.  No other symptoms.  Review of Systems  A thorough review of systems was obtained and all systems are negative except as noted in the HPI and PMH.   Patient's Health History    Past Medical History:  Diagnosis Date   Bronchitis    Cancer (HCC)    Tobacco use     History reviewed. No pertinent surgical history.  Family History  Problem Relation Age of Onset   Bronchiolitis Other     Social History   Socioeconomic History   Marital status: Single    Spouse name: Not on file   Number of children: Not on file   Years of education: Not on file   Highest education level: Not on file  Occupational History   Not on file  Tobacco Use   Smoking status: Every Day    Average packs/day: 1 pack/day for 20.0 years (20.0 ttl pk-yrs)    Types: Cigarettes    Start date: 1996   Smokeless tobacco: Never  Vaping Use   Vaping status: Never Used  Substance and Sexual Activity   Alcohol use: Not Currently    Comment: denies use 04/23/23   Drug use: Not Currently    Types: Marijuana    Comment: denies use 04/23/23   Sexual activity: Not on file  Other Topics Concern   Not on file  Social History Narrative   ** Merged History Encounter **       Social Drivers of Health   Financial Resource Strain: Low Risk  (11/21/2023)   Overall Financial Resource Strain (CARDIA)    Difficulty of Paying Living Expenses: Not hard at all  Food Insecurity: No Food Insecurity (11/21/2023)   Hunger Vital Sign    Worried About Running Out of Food in the Last Year: Never true    Ran Out of Food in the Last Year: Never true   Transportation Needs: No Transportation Needs (11/21/2023)   PRAPARE - Administrator, Civil Service (Medical): No    Lack of Transportation (Non-Medical): No  Physical Activity: Insufficiently Active (11/21/2023)   Exercise Vital Sign    Days of Exercise per Week: 1 day    Minutes of Exercise per Session: 10 min  Stress: No Stress Concern Present (11/21/2023)   [employer] of Occupational Health - Occupational Stress Questionnaire    Feeling of Stress : Not at all  Social Connections: Moderately Integrated (11/21/2023)   Social Connection and Isolation Panel    Frequency of Communication with Friends and Family: More than three times a week    Frequency of Social Gatherings with Friends and Family: More than three times a week    Attends Religious Services: More than 4 times per year    Active Member of [employer] or Organizations: Yes    Attends Banker Meetings: More than 4 times per year    Marital Status: Never married  Intimate Partner Violence: Not At Risk (11/21/2023)   Humiliation, Afraid, Rape, and Kick questionnaire    Fear of Current or Ex-Partner: No    Emotionally Abused: No  Physically Abused: No    Sexually Abused: No     Physical Exam   Vitals:   09/03/24 2029  BP: 115/72  Pulse: 88  Resp: 16  Temp: 98.3 F (36.8 C)  SpO2: 99%    CONSTITUTIONAL: Well-appearing, NAD NEURO/PSYCH:  Alert and oriented x 3, no focal deficits EYES:  eyes equal and reactive ENT/NECK:  no LAD, no JVD CARDIO: Regular rate, well-perfused, normal S1 and S2 PULM:  CTAB no wheezing or rhonchi GI/GU:  non-distended, non-tender MSK/SPINE:  No gross deformities, no edema SKIN:  no rash, atraumatic   *Additional and/or pertinent findings included in MDM below  Diagnostic and Interventional Summary    EKG Interpretation Date/Time:    Ventricular Rate:    PR Interval:    QRS Duration:    QT Interval:    QTC Calculation:   R Axis:      Text  Interpretation:         Labs Reviewed  RESP PANEL BY RT-PCR (RSV, FLU A&B, COVID)  [id number]    No orders to display    Medications - No data to display   Procedures  /  Critical Care Procedures  ED Course and Medical Decision Making  Initial Impression and Ddx Sitting comfortably no acute distress with normal vital signs, minimal URI symptoms without shortness of breath, lungs are clear.  He just wants help help with the symptoms.  Past medical/surgical history that increases complexity of ED encounter: None  Interpretation of Diagnostics I personally reviewed the Laboratory Testing and my interpretation is as follows: COVID flu and RSV negative    Patient Reassessment and Ultimate Disposition/Management     Discharge  Patient management required discussion with the following services or consulting groups:  None  Complexity of Problems Addressed Acute complicated illness or Injury  Additional Data Reviewed and Analyzed Further history obtained from: None  Additional Factors Impacting ED Encounter Risk Prescriptions  Ozell HERO. Theadore, MD [facility] Emergency Medicine [facility] [email or web address]  Final Clinical Impressions(s) / ED Diagnoses     ICD-10-CM   1. Upper respiratory tract infection, unspecified type  J06.9       ED Discharge Orders          Ordered    naproxen  (NAPROSYN ) 500 MG tablet  2 times daily        09/04/24 0202    fluticasone  (FLONASE ) 50 MCG/ACT nasal spray  Daily        09/04/24 0202             Discharge Instructions Discussed with and Provided to Patient:    Discharge Instructions      You were evaluated in the Emergency Department and after careful evaluation, we did not find any emergent condition requiring admission or further testing in the hospital.  Your exam/testing today is overall reassuring.  Symptoms likely due to an upper respiratory infection.  Use the Flonase  as needed.  Use Naprosyn   twice daily for discomfort.  Follow-up with your primary care doctor.  Please return to the Emergency Department if you experience any worsening of your condition.   Thank you for allowing us  to be a part of your care.      Theadore Ozell HERO, MD 09/04/24 [phone]

## 2024-09-25 ENCOUNTER — Ambulatory Visit: Admitting: Orthopedic Surgery

## 2024-10-03 ENCOUNTER — Other Ambulatory Visit: Payer: Self-pay | Admitting: Internal Medicine

## 2024-10-09 ENCOUNTER — Encounter: Payer: Self-pay | Admitting: Orthopedic Surgery

## 2024-10-09 ENCOUNTER — Telehealth: Payer: Self-pay | Admitting: Family Medicine

## 2024-10-09 ENCOUNTER — Ambulatory Visit: Admitting: Orthopedic Surgery

## 2024-10-09 ENCOUNTER — Other Ambulatory Visit

## 2024-10-09 VITALS — BP 104/64 | HR 87 | Ht 66.0 in | Wt 128.0 lb

## 2024-10-09 DIAGNOSIS — G8929 Other chronic pain: Secondary | ICD-10-CM

## 2024-10-09 DIAGNOSIS — M25511 Pain in right shoulder: Secondary | ICD-10-CM | POA: Diagnosis not present

## 2024-10-09 NOTE — Telephone Encounter (Signed)
 Prescription Request  10/09/2024  LOV: Visit date not found  What is the name of the medication or equipment? albuterol  (VENTOLIN  HFA) 108 (90 Base) MCG/ACT inhaler [[id number]   Have you contacted your pharmacy to request a refill? Yes   Which pharmacy would you like this sent to?  [id number] - [place], [place] - [address] [place] [address] Phone: [phone] Fax: [phone]    Patient notified that their request is being sent to the clinical staff for review and that they should receive a response within 2 business days.   Please advise at walked into office

## 2024-10-09 NOTE — Patient Instructions (Signed)

## 2024-10-09 NOTE — Progress Notes (Signed)
 New Patient Visit  Summary: Aaron Spence is a 49 y.o. male with the following: 1. Chronic right shoulder pain  Assessment & Plan Chronic right shoulder pain Pain exacerbated by cold weather. No recent injury or new x-ray findings. Good range of motion and strength. No surgical indication. - Continue OTC pain management with Tylenol  or ibuprofen  as needed. - Contact clinic if pain worsens for potential injection consideration.     Follow-up: Return if symptoms worsen or fail to improve.  Subjective:  Chief Complaint  Patient presents with   Shoulder Pain    R fr yrs, doesn't like injections       Discussed the use of AI scribe software for clinical note transcription with the patient, who gave verbal consent to proceed.  History of Present Illness Aaron Spence is a 48 year old male who presents with right shoulder pain. He was previously followed by Dr. Brenna for evaluation of his right shoulder pain.  He reports months to years of right shoulder pain that worsens in cold weather and is localized to the shoulder. The pain began after an accident years ago. 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 He rarely uses previously prescribed hydrocodone  and has not taken it recently. Tylenol  and ibuprofen  provide partial relief. He denies numbness, tingling, or symptoms in the left shoulder.    Review of Systems: No fevers or chills No numbness or tingling No chest pain No shortness of breath No bowel or bladder dysfunction No GI distress No headaches   Medical History:  Past Medical History:  Diagnosis Date   Bronchitis    Cancer (HCC)    Tobacco use     No past surgical history on file.  Family History  Problem Relation Age of Onset   Bronchiolitis Other    Social History   Tobacco Use   Smoking status: Every Day    Average packs/day: 1 pack/day for 20.0 years (20.0 ttl pk-yrs)    Types: Cigarettes     Start date: 1996   Smokeless tobacco: Never  Vaping Use   Vaping status: Never Used  Substance Use Topics   Alcohol use: Not Currently    Comment: denies use 04/23/23   Drug use: Not Currently    Types: Marijuana    Comment: denies use 04/23/23    No Known Allergies  Current Meds  Medication Sig   albuterol  (VENTOLIN  HFA) 108 (90 Base) MCG/ACT inhaler INHALE 1 PUFF INTO THE LUNGS EVERY 6 HOURS AS NEEDED FOR WHEEZING OR SHORTNESS OF BREATH   azelastine  (ASTELIN ) 0.1 % nasal spray Place 1 spray into both nostrils 2 (two) times daily. Use in each nostril as directed   cetirizine -pseudoephedrine  (ZYRTEC -D) 5-120 MG tablet Take 1 tablet by mouth daily.   doxycycline  (VIBRAMYCIN ) 100 MG capsule Take 1 capsule (100 mg total) by mouth 2 (two) times daily.   fluticasone  (FLONASE ) 50 MCG/ACT nasal spray Place 2 sprays into both nostrils daily for 5 days.   HYDROcodone -acetaminophen  (NORCO/VICODIN) 5-325 MG tablet One tablet every six hours for pain.  Limit 7 days.   loratadine -pseudoephedrine  (CLARITIN -D 24 HOUR) 10-240 MG 24 hr tablet Take 1 tablet by mouth daily.   meloxicam  (MOBIC ) 15 MG tablet Take 1 tablet (15 mg total) by mouth daily.   methocarbamol  (ROBAXIN ) 500 MG tablet Take 1 tablet (500 mg total) by mouth every 8 (eight) hours as needed for muscle spasms.   naproxen  (NAPROSYN ) 500 MG  tablet Take 1 tablet (500 mg total) by mouth 2 (two) times daily.   nicotine  polacrilex (NICORETTE  STARTER KIT) 4 MG gum Take 1 each (4 mg total) by mouth as needed for smoking cessation.   Vitamin D , Ergocalciferol , (DRISDOL ) 1.25 MG (50000 UNIT) CAPS capsule Take 1 capsule (50,000 Units total) by mouth every 7 (seven) days.    Objective: BP 104/64   Pulse 87   Ht 5' 6 (1.676 m)   Wt 128 lb (58.1 kg)   BMI 20.66 kg/m   Physical Exam:    General: Alert and oriented. and No acute distress. Gait: Normal gait.  Physical Exam MUSCULOSKELETAL: Right shoulder without deformity.  Tenderness 60  degrees of forward flexion.  100 degrees of abduction.  Internal rotation to T12.  5/5 strength in supraspinatus, infraspinatus.  Negative belly press.  Fingers warm and well-perfused.   IMAGING: I personally ordered and reviewed the following images   X-rays of the right shoulder were obtained in clinic today.  No acute injuries noted.  Well-maintained glenohumeral joint space.  No evidence of proximal humeral migration.  No chronic rotator cuff injury.  No bony lesions.  Impression: Negative right shoulder x-ray     New Medications:  No orders of the defined types were placed in this encounter.     Portions of this note were completed via Scientist, clinical (histocompatibility and immunogenetics).  Oneil DELENA Horde, MD  10/09/2024 2:10 PM

## 2024-10-09 NOTE — Telephone Encounter (Signed)
 Refill sent 12/04

## 2024-10-15 ENCOUNTER — Ambulatory Visit

## 2024-10-15 VITALS — BP 121/68 | HR 88 | Ht 66.0 in | Wt 128.0 lb

## 2024-10-15 DIAGNOSIS — J310 Chronic rhinitis: Secondary | ICD-10-CM

## 2024-10-15 DIAGNOSIS — G8929 Other chronic pain: Secondary | ICD-10-CM | POA: Diagnosis not present

## 2024-10-15 DIAGNOSIS — M25511 Pain in right shoulder: Secondary | ICD-10-CM | POA: Diagnosis not present

## 2024-10-15 DIAGNOSIS — M545 Low back pain, unspecified: Secondary | ICD-10-CM

## 2024-10-15 MED ORDER — HYDROCODONE-ACETAMINOPHEN 5-325 MG PO TABS: ORAL_TABLET | ORAL | 0 refills | Status: AC

## 2024-10-15 MED ORDER — MELOXICAM 15 MG PO TABS: 15.0000 mg | ORAL_TABLET | Freq: Every day | ORAL | 5 refills | Status: AC

## 2024-10-15 MED ORDER — AZELASTINE HCL 0.1 % NA SOLN: 1.0000 | Freq: Two times a day (BID) | NASAL | 11 refills | Status: AC

## 2024-10-15 NOTE — Progress Notes (Unsigned)
° °  Established Patient Office Visit  Subjective   Patient ID: OTTIS VACHA, male    DOB: [birth year]  Age: 48 y.o. MRN: [id number]  Chief Complaint  Patient presents with   Medical Management of Chronic Issues    3 month follow up    HPI  Patient Active Problem List   Diagnosis Date Noted   Colon cancer screening 09/21/2023   Vitamin D  deficiency 12/30/2022   Encounter for general adult medical examination with abnormal findings 11/03/2022   Tobacco use 11/03/2022   Chronic low back pain 11/03/2022   Right shoulder pain 12/08/2015   Pain in joint, shoulder region 05/05/2014   Muscle weakness (generalized) 05/05/2014      ROS    Objective:     BP 121/68   Pulse 88   Ht 5' 6 (1.676 m)   Wt 128 lb (58.1 kg)   SpO2 99%   BMI 20.66 kg/m  BP Readings from Last 3 Encounters:  10/15/24 121/68  10/09/24 104/64  09/04/24 118/76   Wt Readings from Last 3 Encounters:  10/15/24 128 lb (58.1 kg)  10/09/24 128 lb (58.1 kg)  09/03/24 128 lb 1.4 oz (58.1 kg)      Physical Exam   No results found for any visits on 10/15/24.  {Labs (Optional):23779}  The 10-year ASCVD risk score (Arnett DK, et al., 2019) is: 6.1%    Assessment & Plan:   Problem List Items Addressed This Visit   None   No follow-ups on file.    Leita Longs, FNP

## 2024-10-18 DIAGNOSIS — J310 Chronic rhinitis: Secondary | ICD-10-CM | POA: Insufficient documentation

## 2024-10-18 NOTE — Assessment & Plan Note (Signed)
 History of chronic low back pain.  He has been followed by orthopedic surgery (Dr. Brenna) who prescribed Norco 5-325 mg in the past, but no longer prescribes this for him.  Agree to refill until he can establish with pain management.  PDMP reviewed.

## 2024-10-18 NOTE — Assessment & Plan Note (Signed)
-   Right shoulder pain worsens in cold weather, especially when working outside - Pain is exacerbated by exposure to wind - Tylenol  is ineffective for pain relief - Previously tried tramadol, which was less effective than other pain medications - Shoulder x-ray performed last week

## 2024-11-07 ENCOUNTER — Other Ambulatory Visit: Payer: Self-pay | Admitting: Internal Medicine

## 2024-11-26 ENCOUNTER — Ambulatory Visit: Payer: 59

## 2024-11-26 VITALS — Ht 67.0 in | Wt 128.0 lb

## 2024-11-26 DIAGNOSIS — F17210 Nicotine dependence, cigarettes, uncomplicated: Secondary | ICD-10-CM | POA: Diagnosis not present

## 2024-11-26 DIAGNOSIS — Z1211 Encounter for screening for malignant neoplasm of colon: Secondary | ICD-10-CM | POA: Diagnosis not present

## 2024-11-26 DIAGNOSIS — Z1212 Encounter for screening for malignant neoplasm of rectum: Secondary | ICD-10-CM | POA: Diagnosis not present

## 2024-11-26 DIAGNOSIS — Z0000 Encounter for general adult medical examination without abnormal findings: Secondary | ICD-10-CM

## 2024-11-26 DIAGNOSIS — Z0001 Encounter for general adult medical examination with abnormal findings: Secondary | ICD-10-CM | POA: Diagnosis not present

## 2024-11-26 NOTE — Patient Instructions (Addendum)
 Mr. Aaron Spence,  Thank you for taking the time for your Medicare Wellness Visit. I appreciate your continued commitment to your health goals. Please review the care plan we discussed, and feel free to reach out if I can assist you further.  Please note that Annual Wellness Visits do not include a physical exam. Some assessments may be limited, especially if the visit was conducted virtually. If needed, we may recommend an in-person follow-up with your provider.  Ongoing Care Seeing your primary care provider every 3 to 6 months helps us  monitor your health and provide consistent, personalized care.   Aim for 30 minutes of exercise or brisk walking, 6-8 glasses of water , and 5 servings of fruits and vegetables each day.  Referrals If a referral was made during today's visit and you haven't received any updates within two weeks, please contact the referred provider directly to check on the status. Lung Cancer Screening-[place] [facility] [address]-First Floor Medical Building directly across from [facility] Phone [phone]   [facility] at  [address] Phone: [phone]  Recommended Screenings:  Health Maintenance  Topic Date Due   Pneumococcal Vaccine (1 of 2 - PCV) Never done   Hepatitis B Vaccine (1 of 3 - 19+ 3-dose series) Never done   Colon Cancer Screening  Never done   COVID-19 Vaccine (1 - 2025-26 season) Never done   Medicare Annual Wellness Visit  11/20/2024   Screening for Lung Cancer  11/26/2024   Flu Shot  01/28/2025*   HPV Vaccine (No Doses Required) Completed   Hepatitis C Screening  Completed   HIV Screening  Completed   Meningitis B Vaccine  Aged Out   DTaP/Tdap/Td vaccine  Discontinued  *Topic was postponed. The date shown is not the original due date.       11/26/2024   11:38 AM  Advanced Directives  Does Patient Have a Medical Advance Directive? No  Would patient like information on  creating a medical advance directive? No - Patient declined    Vision: Annual vision screenings are recommended for early detection of glaucoma, cataracts, and diabetic retinopathy. These exams can also reveal signs of chronic conditions such as diabetes and high blood pressure.  Dental: Annual dental screenings help detect early signs of oral cancer, gum disease, and other conditions linked to overall health, including heart disease and diabetes.  Please see the attached documents for additional preventive care recommendations.

## 2024-11-26 NOTE — Progress Notes (Signed)
 " HM Addressed: Referral sent to GI for colonoscopy Referral sent for Low Dose Chest CT (smoker/hx smoking) Chief Complaint  Patient presents with   Medicare Wellness     Subjective:   Aaron Spence is a 49 y.o. male who presents for a Medicare Annual Wellness Visit.  Visit info / Clinical Intake: Medicare Wellness Visit Type:: Subsequent Annual Wellness Visit Persons participating in visit and providing information:: patient Medicare Wellness Visit Mode:: Telephone If telephone:: video declined Since this visit was completed virtually, some vitals may be partially provided or unavailable. Missing vitals are due to the limitations of the virtual format.: Documented vitals are patient reported If Telephone or Video please confirm:: I connected with patient using audio/video enable telemedicine. I verified patient identity with two identifiers, discussed telehealth limitations, and patient agreed to proceed. Patient Location:: home Provider Location:: home office Interpreter Needed?: No Pre-visit prep was completed: yes AWV questionnaire completed by patient prior to visit?: no Living arrangements:: with family/others Patient's Overall Health Status Rating: good Typical amount of pain: some Does pain affect daily life?: (!) yes Are you currently prescribed opioids?: (!) yes  Dietary Habits and Nutritional Risks How many meals a day?: 3 Eats fruit and vegetables daily?: yes Most meals are obtained by: having others provide food In the last 2 weeks, have you had any of the following?: none Diabetic:: no  Functional Status Activities of Daily Living (to include ambulation/medication): Independent Ambulation: Independent Medication Administration: Independent Home Management (perform basic housework or laundry): Independent Manage your own finances?: yes Primary transportation is: driving Concerns about vision?: no *vision screening is required for WTM* Concerns about hearing?:  no  Fall Screening Falls in the past year?: 0 Number of falls in past year: 0 Was there an injury with Fall?: 0 Fall Risk Category Calculator: 0 Patient Fall Risk Level: Low Fall Risk  Fall Risk Patient at Risk for Falls Due to: Orthopedic patient Fall risk Follow up: Falls evaluation completed; Education provided; Falls prevention discussed  Home and Transportation Safety: All rugs have non-skid backing?: yes All stairs or steps have railings?: yes Grab bars in the bathtub or shower?: (!) no Have non-skid surface in bathtub or shower?: yes Good home lighting?: yes Regular seat belt use?: yes Hospital stays in the last year:: no  Cognitive Assessment Difficulty concentrating, remembering, or making decisions? : no Will 6CIT or Mini Cog be Completed: yes What year is it?: 0 points What month is it?: 0 points Give patient an address phrase to remember (5 components): [address] [name] what time is it?: 0 points Count backwards from 20 to 1: 0 points Say the months of the year in reverse: 0 points Repeat the address phrase from earlier: 0 points 6 CIT Score: 0 points  Advance Directives (For Healthcare) Does Patient Have a Medical Advance Directive?: No Would patient like information on creating a medical advance directive?: No - Patient declined  Reviewed/Updated  Reviewed/Updated: Reviewed All (Medical, Surgical, Family, Medications, Allergies, Care Teams, Patient Goals)    Allergies (verified) Patient has no known allergies.   Current Medications (verified) Outpatient Encounter Medications as of 11/26/2024  Medication Sig   albuterol  (VENTOLIN  HFA) 108 (90 Base) MCG/ACT inhaler INHALE 1 PUFF INTO THE LUNGS EVERY 6 HOURS AS NEEDED FOR WHEEZING OR SHORTNESS OF BREATH   azelastine  (ASTELIN ) 0.1 % nasal spray Place 1 spray into both nostrils 2 (two) times daily. Use in each nostril as directed   cetirizine -pseudoephedrine  (ZYRTEC -D) 5-120 MG  tablet Take 1  tablet by mouth daily.   doxycycline  (VIBRAMYCIN ) 100 MG capsule Take 1 capsule (100 mg total) by mouth 2 (two) times daily.   fluticasone  (FLONASE ) 50 MCG/ACT nasal spray Place 2 sprays into both nostrils daily for 5 days.   HYDROcodone -acetaminophen  (NORCO/VICODIN) 5-325 MG tablet One tablet every six hours for pain.  Limit 7 days.   loratadine -pseudoephedrine  (CLARITIN -D 24 HOUR) 10-240 MG 24 hr tablet Take 1 tablet by mouth daily.   meloxicam  (MOBIC ) 15 MG tablet Take 1 tablet (15 mg total) by mouth daily.   methocarbamol  (ROBAXIN ) 500 MG tablet Take 1 tablet (500 mg total) by mouth every 8 (eight) hours as needed for muscle spasms.   nicotine  polacrilex (NICORETTE  STARTER KIT) 4 MG gum Take 1 each (4 mg total) by mouth as needed for smoking cessation.   Vitamin D , Ergocalciferol , (DRISDOL ) 1.25 MG (50000 UNIT) CAPS capsule Take 1 capsule (50,000 Units total) by mouth every 7 (seven) days.   No facility-administered encounter medications on file as of 11/26/2024.    History: Past Medical History:  Diagnosis Date   Bronchitis    Cancer (HCC)    Tobacco use    History reviewed. No pertinent surgical history. Family History  Problem Relation Age of Onset   Bronchiolitis Other    Social History   Occupational History   Not on file  Tobacco Use   Smoking status: Every Day    Current packs/day: 1.00    Average packs/day: 1 pack/day for 30.1 years (30.1 ttl pk-yrs)    Types: Cigarettes    Start date: 1996   Smokeless tobacco: Never  Vaping Use   Vaping status: Never Used  Substance and Sexual Activity   Alcohol use: Not Currently    Comment: denies use 04/23/23   Drug use: Not Currently    Types: Marijuana    Comment: denies use 04/23/23   Sexual activity: Not on file   Tobacco Counseling Ready to quit: No Counseling given: Yes  SDOH Screenings   Food Insecurity: No Food Insecurity (11/26/2024)  Housing: Low Risk (11/26/2024)  Transportation Needs: No Transportation  Needs (11/26/2024)  Utilities: Not At Risk (11/26/2024)  Alcohol Screen: Low Risk (11/21/2023)  Depression (PHQ2-9): Low Risk (11/26/2024)  Financial Resource Strain: Low Risk (11/21/2023)  Physical Activity: Patient Declined (11/26/2024)  Social Connections: Moderately Integrated (11/26/2024)  Stress: No Stress Concern Present (11/26/2024)  Tobacco Use: High Risk (11/26/2024)  Health Literacy: Adequate Health Literacy (11/26/2024)   See flowsheets for full screening details  Depression Screen PHQ 2 & 9 Depression Scale- Over the past 2 weeks, how often have you been bothered by any of the following problems? Little interest or pleasure in doing things: 0 Feeling down, depressed, or hopeless (PHQ Adolescent also includes...irritable): 0 PHQ-2 Total Score: 0 Trouble falling or staying asleep, or sleeping too much: 0 Feeling tired or having little energy: 0 Poor appetite or overeating (PHQ Adolescent also includes...weight loss): 0 Feeling bad about yourself - or that you are a failure or have let yourself or your family down: 0 Trouble concentrating on things, such as reading the newspaper or watching television (PHQ Adolescent also includes...like school work): 0 Moving or speaking so slowly that other people could have noticed. Or the opposite - being so fidgety or restless that you have been moving around a lot more than usual: 0 Thoughts that you would be better off dead, or of hurting yourself in some way: 0 PHQ-9 Total Score: 0  Depression  Treatment Depression Interventions/Treatment : EYV7-0 Score <4 Follow-up Not Indicated     Goals Addressed               This Visit's Progress     Improve my health (pt-stated)               Objective:    Today's Vitals   11/26/24 1134  Weight: 128 lb (58.1 kg)  Height: 5' 7 (1.702 m)   Body mass index is 20.05 kg/m.  Hearing/Vision screen Hearing Screening - Comments:: Patient denies any hearing difficulties.   Vision  Screening - Comments:: Patient does not have an eye doctor. A list of eye doctors has been provided to the patient.   Immunizations and Health Maintenance Health Maintenance  Topic Date Due   Pneumococcal Vaccine (1 of 2 - PCV) Never done   Hepatitis B Vaccines 19-59 Average Risk (1 of 3 - 19+ 3-dose series) Never done   Colonoscopy  Never done   COVID-19 Vaccine (1 - 2025-26 season) Never done   Medicare Annual Wellness (AWV)  11/20/2024   Lung Cancer Screening  11/26/2024   Influenza Vaccine  01/28/2025 (Originally 05/31/2024)   HPV VACCINES (No Doses Required) Completed   Hepatitis C Screening  Completed   HIV Screening  Completed   Meningococcal B Vaccine  Aged Out   DTaP/Tdap/Td  Discontinued        Assessment/Plan:  This is a routine wellness examination for Aaron Spence.  Patient Care Team: Bacchus, Meade PEDLAR, FNP as PCP - General (Family Medicine) Fanta, Tesfaye Demissie, MD (Internal Medicine)  I have personally reviewed and noted the following in the patients chart:   Medical and social history Use of alcohol, tobacco or illicit drugs  Current medications and supplements including opioid prescriptions. Functional ability and status Nutritional status Physical activity Advanced directives List of other physicians Hospitalizations, surgeries, and ER visits in previous 12 months Vitals Screenings to include cognitive, depression, and falls Referrals and appointments  Orders Placed This Encounter  Procedures   Ambulatory Referral for Lung Cancer Scre    Referral Priority:   Routine    Referral Type:   Consultation    Referral Reason:   Specialty Services Required    Number of Visits Requested:   1   In addition, I have reviewed and discussed with patient certain preventive protocols, quality metrics, and best practice recommendations. A written personalized care plan for preventive services as well as general preventive health recommendations were provided to  patient.   Mekayla Soman, CMA   11/26/2024   Return November 28, 2025, at 9:20 am, for In office Medicare Well Visit w  Wellness Nurse.  After Visit Summary: (Mail) Due to this being a telephonic visit, the after visit summary with patients personalized plan was offered to patient via mail    "

## 2024-12-05 ENCOUNTER — Telehealth: Payer: Self-pay | Admitting: *Deleted

## 2024-12-05 NOTE — Telephone Encounter (Signed)
 __ blood in your stool __ rectal bleeding __ change in bowel habits __ constipation - new onset __ diarrhea     Procedure: colonoscopy  Height: 5'4 Weight: 128 lbs      BMI: 22.0  Have you had a colonoscopy before?  no  Do you have family history of colon cancer?  no  Do you have a family history of polyps? no  Previous colonoscopy with polyps removed? no  Do you have a history colorectal cancer?   no  Are you diabetic?  no  Do you have a prosthetic or mechanical heart valve? no  Do you have a pacemaker/defibrillator?   no  Have you had endocarditis/atrial fibrillation?  no  Do you use supplemental oxygen /CPAP?  no  Have you had joint replacement within the last 12 months?  no  Do you tend to be constipated or have to use laxatives?  no   Do you have history of alcohol use? If yes, how much and how often.  no  Do you have history or are you using drugs? If yes, what do are you  using?  no  Have you ever had a stroke/heart attack?  no  Have you ever had a heart or other vascular stent placed,?no  Do you take weight loss medication? no  Do you take any blood-thinning medications such as: (Plavix, aspirin, Coumadin, Aggrenox, Brilinta, Xarelto, Eliquis, Pradaxa, Savaysa or Effient)? NO  If yes we need the name, milligram, dosage and who is prescribing doctor:  n/a             Current Outpatient Medications  Medication Sig Dispense Refill   albuterol  (VENTOLIN  HFA) 108 (90 Base) MCG/ACT inhaler INHALE 1 PUFF INTO THE LUNGS EVERY 6 HOURS AS NEEDED FOR WHEEZING OR SHORTNESS OF BREATH 6.7 g 1   azelastine  (ASTELIN ) 0.1 % nasal spray Place 1 spray into both nostrils 2 (two) times daily. Use in each nostril as directed 30 mL 11   doxycycline  (VIBRAMYCIN ) 100 MG capsule Take 1 capsule (100 mg total) by mouth 2 (two) times daily. 20 capsule 0   fluticasone  (FLONASE ) 50 MCG/ACT nasal spray Place 2 sprays into both nostrils daily for 5 days. 9.9 mL 0    HYDROcodone -acetaminophen  (NORCO/VICODIN) 5-325 MG tablet One tablet every six hours for pain.  Limit 7 days. 20 tablet 0   loratadine -pseudoephedrine  (CLARITIN -D 24 HOUR) 10-240 MG 24 hr tablet Take 1 tablet by mouth daily. 14 tablet 0   meloxicam  (MOBIC ) 15 MG tablet Take 1 tablet (15 mg total) by mouth daily. 30 tablet 5   methocarbamol  (ROBAXIN ) 500 MG tablet Take 1 tablet (500 mg total) by mouth every 8 (eight) hours as needed for muscle spasms. 20 tablet 0   Vitamin D , Ergocalciferol , (DRISDOL ) 1.25 MG (50000 UNIT) CAPS capsule Take 1 capsule (50,000 Units total) by mouth every 7 (seven) days. 12 capsule 0   nicotine  polacrilex (NICORETTE  STARTER KIT) 4 MG gum Take 1 each (4 mg total) by mouth as needed for smoking cessation. (Patient not taking: Reported on 12/05/2024) 100 tablet 2   No current facility-administered medications for this visit.    Allergies[1]      [1] No Known Allergies

## 2025-04-15 ENCOUNTER — Ambulatory Visit: Payer: Self-pay

## 2025-11-28 ENCOUNTER — Ambulatory Visit: Payer: Self-pay
